# Patient Record
Sex: Female | Born: 1995 | Race: White | Hispanic: No | Marital: Single | Smoking: Current some day smoker
Health system: Southern US, Community
[De-identification: ages and names within clinical notes are randomized; demographics above are authoritative.]

## PROBLEM LIST (undated history)

## (undated) ENCOUNTER — Inpatient Hospital Stay: Payer: Self-pay

## (undated) DIAGNOSIS — F32A Depression, unspecified: Secondary | ICD-10-CM

## (undated) DIAGNOSIS — G43909 Migraine, unspecified, not intractable, without status migrainosus: Secondary | ICD-10-CM

## (undated) DIAGNOSIS — F329 Major depressive disorder, single episode, unspecified: Secondary | ICD-10-CM

## (undated) DIAGNOSIS — I499 Cardiac arrhythmia, unspecified: Secondary | ICD-10-CM

## (undated) DIAGNOSIS — Z8619 Personal history of other infectious and parasitic diseases: Secondary | ICD-10-CM

## (undated) DIAGNOSIS — S2220XA Unspecified fracture of sternum, initial encounter for closed fracture: Secondary | ICD-10-CM

## (undated) DIAGNOSIS — T148XXA Other injury of unspecified body region, initial encounter: Secondary | ICD-10-CM

## (undated) DIAGNOSIS — F419 Anxiety disorder, unspecified: Secondary | ICD-10-CM

## (undated) DIAGNOSIS — D563 Thalassemia minor: Secondary | ICD-10-CM

## (undated) HISTORY — DX: Migraine, unspecified, not intractable, without status migrainosus: G43.909

## (undated) HISTORY — DX: Major depressive disorder, single episode, unspecified: F32.9

## (undated) HISTORY — DX: Depression, unspecified: F32.A

## (undated) HISTORY — DX: Cardiac arrhythmia, unspecified: I49.9

## (undated) HISTORY — PX: WISDOM TOOTH EXTRACTION: SHX21

---
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
## 2015-01-06 ENCOUNTER — Observation Stay
Admission: EM | Admit: 2015-01-06 | Discharge: 2015-01-06 | Disposition: A | Discharge status: Home / Self Care | Payer: Medicaid Other | Attending: Obstetrics and Gynecology | Admitting: Obstetrics and Gynecology

## 2015-01-06 ENCOUNTER — Other Ambulatory Visit: Payer: Self-pay | Admitting: Family Medicine

## 2015-01-06 ENCOUNTER — Encounter: Payer: Self-pay | Admitting: *Deleted

## 2015-01-06 DIAGNOSIS — O26899 Other specified pregnancy related conditions, unspecified trimester: Secondary | ICD-10-CM | POA: Diagnosis not present

## 2015-01-06 DIAGNOSIS — Z3A00 Weeks of gestation of pregnancy not specified: Secondary | ICD-10-CM | POA: Diagnosis not present

## 2015-01-06 DIAGNOSIS — Z720 Tobacco use: Secondary | ICD-10-CM

## 2015-01-06 DIAGNOSIS — R1030 Lower abdominal pain, unspecified: Secondary | ICD-10-CM | POA: Diagnosis not present

## 2015-01-06 LAB — URINALYSIS COMPLETE WITH MICROSCOPIC (ARMC ONLY)
Bilirubin Urine: NEGATIVE
Glucose, UA: NEGATIVE mg/dL
Hgb urine dipstick: NEGATIVE
KETONES UR: NEGATIVE mg/dL
Leukocytes, UA: NEGATIVE
NITRITE: NEGATIVE
PH: 7 (ref 5.0–8.0)
Protein, ur: NEGATIVE mg/dL
Specific Gravity, Urine: 1.015 (ref 1.005–1.030)

## 2015-01-06 MED ORDER — ACETAMINOPHEN-CODEINE 300-30 MG PO TABS: 1.0000 | ORAL_TABLET | Freq: Four times a day (QID) | ORAL | Status: DC | PRN

## 2015-01-06 NOTE — OB Triage Note (Signed)
Complaints of intermittent lower abdominal pain with feelings of "my belly locking down". No leaking fluid, vaginal bleeding.

## 2015-01-06 NOTE — Telephone Encounter (Signed)
Patient notified RX was phoned in to the pharmacy.

## 2015-01-06 NOTE — Discharge Summary (Signed)
Reviewed discharge instruction with patient including signs to return: CTX, bleeding, decreased fetal movement, and pre-eclampsia signs

## 2015-01-06 NOTE — Discharge Instructions (Signed)
Smoking Cessation, Tips for Success If you are ready to quit smoking, congratulations! You have chosen to help yourself be healthier. Cigarettes bring nicotine, tar, carbon monoxide, and other irritants into your body. Your lungs, heart, and blood vessels will be able to work better without these poisons. There are many different ways to quit smoking. Nicotine gum, nicotine patches, a nicotine inhaler, or nicotine nasal spray can help with physical craving. Hypnosis, support groups, and medicines help break the habit of smoking. WHAT THINGS CAN I DO TO MAKE QUITTING EASIER?  Here are some tips to help you quit for good:  Pick a date when you will quit smoking completely. Tell all of your friends and family about your plan to quit on that date.  Do not try to slowly cut down on the number of cigarettes you are smoking. Pick a quit date and quit smoking completely starting on that day.  Throw away all cigarettes.   Clean and remove all ashtrays from your home, work, and car.  On a card, write down your reasons for quitting. Carry the card with you and read it when you get the urge to smoke.  Cleanse your body of nicotine. Drink enough water and fluids to keep your urine clear or pale yellow. Do this after quitting to flush the nicotine from your body.  Learn to predict your moods. Do not let a bad situation be your excuse to have a cigarette. Some situations in your life might tempt you into wanting a cigarette.  Never have "just one" cigarette. It leads to wanting another and another. Remind yourself of your decision to quit.  Change habits associated with smoking. If you smoked while driving or when feeling stressed, try other activities to replace smoking. Stand up when drinking your coffee. Brush your teeth after eating. Sit in a different chair when you read the paper. Avoid alcohol while trying to quit, and try to drink fewer caffeinated beverages. Alcohol and caffeine may urge you to  smoke.  Avoid foods and drinks that can trigger a desire to smoke, such as sugary or spicy foods and alcohol.  Ask people who smoke not to smoke around you.  Have something planned to do right after eating or having a cup of coffee. For example, plan to take a walk or exercise.  Try a relaxation exercise to calm you down and decrease your stress. Remember, you may be tense and nervous for the first 2 weeks after you quit, but this will pass.  Find new activities to keep your hands busy. Play with a pen, coin, or rubber band. Doodle or draw things on paper.  Brush your teeth right after eating. This will help cut down on the craving for the taste of tobacco after meals. You can also try mouthwash.   Use oral substitutes in place of cigarettes. Try using lemon drops, carrots, cinnamon sticks, or chewing gum. Keep them handy so they are available when you have the urge to smoke.  When you have the urge to smoke, try deep breathing.  Designate your home as a nonsmoking area.  If you are a heavy smoker, ask your health care provider about a prescription for nicotine chewing gum. It can ease your withdrawal from nicotine.  Reward yourself. Set aside the cigarette money you save and buy yourself something nice.  Look for support from others. Join a support group or smoking cessation program. Ask someone at home or at work to help you with your plan   to quit smoking.  Always ask yourself, "Do I need this cigarette or is this just a reflex?" Tell yourself, "Today, I choose not to smoke," or "I do not want to smoke." You are reminding yourself of your decision to quit.  Do not replace cigarette smoking with electronic cigarettes (commonly called e-cigarettes). The safety of e-cigarettes is unknown, and some may contain harmful chemicals.  If you relapse, do not give up! Plan ahead and think about what you will do the next time you get the urge to smoke. HOW WILL I FEEL WHEN I QUIT SMOKING? You  may have symptoms of withdrawal because your body is used to nicotine (the addictive substance in cigarettes). You may crave cigarettes, be irritable, feel very hungry, cough often, get headaches, or have difficulty concentrating. The withdrawal symptoms are only temporary. They are strongest when you first quit but will go away within 10-14 days. When withdrawal symptoms occur, stay in control. Think about your reasons for quitting. Remind yourself that these are signs that your body is healing and getting used to being without cigarettes. Remember that withdrawal symptoms are easier to treat than the major diseases that smoking can cause.  Even after the withdrawal is over, expect periodic urges to smoke. However, these cravings are generally short lived and will go away whether you smoke or not. Do not smoke! WHAT RESOURCES ARE AVAILABLE TO HELP ME QUIT SMOKING? Your health care provider can direct you to community resources or hospitals for support, which may include:  Group support.  Education.  Hypnosis.  Therapy.   This information is not intended to replace advice given to you by your health care provider. Make sure you discuss any questions you have with your health care provider.   Document Released: 11/27/2003 Document Revised: 03/21/2014 Document Reviewed: 08/16/2012 Elsevier Interactive Patient Education 2016 [employer]  

## 2015-01-14 ENCOUNTER — Telehealth: Payer: Self-pay

## 2015-01-14 DIAGNOSIS — Z720 Tobacco use: Secondary | ICD-10-CM

## 2015-01-14 NOTE — Telephone Encounter (Signed)
Pt calls stating her left ribs are hurting ie: sharp pain under left rib, and since last night pt states she started hurting when she voiding. Pt states [facility]/hands swollen, turning purple, and headache and they sent her home. Her B/P was okay per pt. No swelling of hands/feet today. Pt to take Tylenol or her Tylenol #3 (pt states she tries not to take it and has not), drink fluids, heating pad to back, not to side or stomach. If pain unbearable she can go to ER otherwise we will see at appt tomorrow.  Dr. Valentino Saxonherry aware.

## 2015-01-15 ENCOUNTER — Ambulatory Visit (INDEPENDENT_AMBULATORY_CARE_PROVIDER_SITE_OTHER): Payer: Medicaid Other | Admitting: Obstetrics and Gynecology

## 2015-01-15 VITALS — BP 121/82 | HR 101 | Wt 202.3 lb

## 2015-01-15 DIAGNOSIS — Z3493 Encounter for supervision of normal pregnancy, unspecified, third trimester: Secondary | ICD-10-CM

## 2015-01-15 DIAGNOSIS — R399 Unspecified symptoms and signs involving the genitourinary system: Secondary | ICD-10-CM

## 2015-01-15 LAB — POCT URINALYSIS DIPSTICK
Bilirubin, UA: NEGATIVE
Glucose, UA: NEGATIVE
KETONES UA: NEGATIVE
Leukocytes, UA: NEGATIVE
Nitrite, UA: NEGATIVE
PH UA: 7
PROTEIN UA: NEGATIVE
RBC UA: NEGATIVE
SPEC GRAV UA: 1.015
UROBILINOGEN UA: NEGATIVE

## 2015-01-15 NOTE — Progress Notes (Signed)
ROB: Patient doing well today. [facility] due to extreme swelling of hands and feet, hurt to ambulate. Only modest resolution with elevation of legs. Advised on compression stockings and hand brace prn.  BPs wnl, however has had 7 lb weight gain in 2 weeks.  Will continue to monitor.  Also notes urinary discomfort and urgency.  UA wnl today. Possibly due to fetal positioning.  RTC in 2 weeks.

## 2015-01-17 LAB — URINE CULTURE: ORGANISM ID, BACTERIA: NO GROWTH

## 2015-01-26 ENCOUNTER — Telehealth: Payer: Self-pay

## 2015-01-26 DIAGNOSIS — Z720 Tobacco use: Secondary | ICD-10-CM

## 2015-01-26 NOTE — Telephone Encounter (Signed)
Pt called telepone advice line on 01/24/2015 with throat is sore and feels swollen, nose is blocked on left side, ears are hurting, having contractions, stomach is getting tight, sharp pains, feels warm but does not have thermometer. Continues to have cough, productive with green and sometimes nose bleeds,  ?fever, throat sore since Friday. Pt to try normal saline nose spray, cool mist humidifier, increase fluid intake and rest. Encouraged to obtain a thermometer. Advised colds are a virus and usually need to run there course which is approx. About 1 wk. But sometimes and infection occurs before hand. Pt said she called hospital because she couldn't sleep Friday night. No c/o contractions. To contact office tomorrow if symptons worsen.

## 2015-01-29 ENCOUNTER — Ambulatory Visit (INDEPENDENT_AMBULATORY_CARE_PROVIDER_SITE_OTHER): Payer: Medicaid Other | Admitting: Obstetrics and Gynecology

## 2015-01-29 VITALS — BP 113/75 | HR 87 | Wt 209.5 lb

## 2015-01-29 DIAGNOSIS — Z3493 Encounter for supervision of normal pregnancy, unspecified, third trimester: Secondary | ICD-10-CM

## 2015-01-29 DIAGNOSIS — J069 Acute upper respiratory infection, unspecified: Secondary | ICD-10-CM

## 2015-01-29 DIAGNOSIS — Z113 Encounter for screening for infections with a predominantly sexual mode of transmission: Secondary | ICD-10-CM

## 2015-01-29 DIAGNOSIS — Z1389 Encounter for screening for other disorder: Secondary | ICD-10-CM

## 2015-01-29 LAB — POCT URINALYSIS DIPSTICK
Bilirubin, UA: NEGATIVE
Glucose, UA: NEGATIVE
KETONES UA: NEGATIVE
Leukocytes, UA: NEGATIVE
Nitrite, UA: 1
PH UA: 7
PROTEIN UA: NEGATIVE
RBC UA: NEGATIVE
SPEC GRAV UA: 1.02
UROBILINOGEN UA: NEGATIVE

## 2015-01-29 NOTE — Progress Notes (Signed)
Patient notes she is still having URI symptoms despite robitussin use.  Advised on Tylenol Cold product. 36 week labs done. Initial BP elevated but repeat wnl.  RTC in 2 weeks.

## 2015-01-31 ENCOUNTER — Observation Stay
Admission: EM | Admit: 2015-01-31 | Discharge: 2015-01-31 | Disposition: A | Discharge status: Home / Self Care | Payer: Medicaid Other | Attending: Obstetrics and Gynecology | Admitting: Obstetrics and Gynecology

## 2015-01-31 DIAGNOSIS — Z3A36 36 weeks gestation of pregnancy: Secondary | ICD-10-CM | POA: Insufficient documentation

## 2015-01-31 DIAGNOSIS — O479 False labor, unspecified: Secondary | ICD-10-CM | POA: Diagnosis present

## 2015-01-31 DIAGNOSIS — O4700 False labor before 37 completed weeks of gestation, unspecified trimester: Secondary | ICD-10-CM | POA: Diagnosis present

## 2015-01-31 LAB — GC/CHLAMYDIA PROBE AMP
CHLAMYDIA, DNA PROBE: NEGATIVE
NEISSERIA GONORRHOEAE BY PCR: NEGATIVE

## 2015-01-31 NOTE — Plan of Care (Signed)
cerivx unchanged after being observed in l/d x 2 hours. Signs and symptoms of labor reviewed. Pt d/c home in stable condition.

## 2015-02-02 LAB — CULTURE, BETA STREP (GROUP B ONLY): STREP GP B CULTURE: NEGATIVE

## 2015-02-03 ENCOUNTER — Ambulatory Visit (INDEPENDENT_AMBULATORY_CARE_PROVIDER_SITE_OTHER): Payer: Medicaid Other | Admitting: Obstetrics and Gynecology

## 2015-02-03 VITALS — BP 118/75 | HR 103 | Wt 209.7 lb

## 2015-02-03 DIAGNOSIS — Z3493 Encounter for supervision of normal pregnancy, unspecified, third trimester: Secondary | ICD-10-CM

## 2015-02-03 DIAGNOSIS — O2603 Excessive weight gain in pregnancy, third trimester: Secondary | ICD-10-CM

## 2015-02-03 LAB — POCT URINALYSIS DIPSTICK
Bilirubin, UA: NEGATIVE
Glucose, UA: NEGATIVE
Ketones, UA: NEGATIVE
Leukocytes, UA: NEGATIVE
NITRITE UA: NEGATIVE
RBC UA: NEGATIVE
UROBILINOGEN UA: NEGATIVE
pH, UA: 6

## 2015-02-03 NOTE — Progress Notes (Signed)
ROB: Patient complains of intermmitent ctx.  Seen in triage over the weekend for ctx, ruled out for labor. PTL precautions given. 3rd trimester labs negative. Counseled on excessive weight gain.

## 2015-02-10 ENCOUNTER — Ambulatory Visit (INDEPENDENT_AMBULATORY_CARE_PROVIDER_SITE_OTHER): Payer: Medicaid Other | Admitting: Obstetrics and Gynecology

## 2015-02-10 VITALS — BP 121/77 | HR 99 | Wt 213.0 lb

## 2015-02-10 DIAGNOSIS — Z3483 Encounter for supervision of other normal pregnancy, third trimester: Secondary | ICD-10-CM

## 2015-02-10 DIAGNOSIS — F419 Anxiety disorder, unspecified: Secondary | ICD-10-CM

## 2015-02-10 DIAGNOSIS — Z3493 Encounter for supervision of normal pregnancy, unspecified, third trimester: Secondary | ICD-10-CM

## 2015-02-10 DIAGNOSIS — O2600 Excessive weight gain in pregnancy, unspecified trimester: Secondary | ICD-10-CM

## 2015-02-10 LAB — POCT URINALYSIS DIPSTICK
Bilirubin, UA: NEGATIVE
Blood, UA: NEGATIVE
GLUCOSE UA: NEGATIVE
Ketones, UA: NEGATIVE
Leukocytes, UA: NEGATIVE
Nitrite, UA: NEGATIVE
Protein, UA: NEGATIVE
SPEC GRAV UA: 1.015
UROBILINOGEN UA: NEGATIVE
pH, UA: 6.5

## 2015-02-10 MED ORDER — ACETAMINOPHEN-CODEINE 300-30 MG PO TABS: 1.0000 | ORAL_TABLET | Freq: Four times a day (QID) | ORAL | Status: DC | PRN

## 2015-02-10 NOTE — Progress Notes (Signed)
ROB: Patient notes that she lost mucus plug.  Is having occasional contractions.  Labor precautions given.  Refill given of T#3 at patient's request. Continue to encourage no further weight gain.

## 2015-02-11 ENCOUNTER — Inpatient Hospital Stay
Admission: EM | Admit: 2015-02-11 | Discharge: 2015-02-14 | DRG: 774 | Disposition: A | Discharge status: Home / Self Care | Payer: Medicaid Other | Attending: Obstetrics and Gynecology | Admitting: Obstetrics and Gynecology

## 2015-02-11 ENCOUNTER — Inpatient Hospital Stay: Payer: Medicaid Other | Admitting: Anesthesiology

## 2015-02-11 ENCOUNTER — Observation Stay
Admission: EM | Admit: 2015-02-11 | Discharge: 2015-02-11 | Disposition: A | Discharge status: Home / Self Care | Payer: Medicaid Other | Source: Home / Self Care | Admitting: Obstetrics and Gynecology

## 2015-02-11 DIAGNOSIS — F17210 Nicotine dependence, cigarettes, uncomplicated: Secondary | ICD-10-CM | POA: Diagnosis present

## 2015-02-11 DIAGNOSIS — O2600 Excessive weight gain in pregnancy, unspecified trimester: Secondary | ICD-10-CM | POA: Diagnosis present

## 2015-02-11 DIAGNOSIS — Z3A37 37 weeks gestation of pregnancy: Secondary | ICD-10-CM | POA: Insufficient documentation

## 2015-02-11 DIAGNOSIS — I471 Supraventricular tachycardia: Secondary | ICD-10-CM | POA: Diagnosis not present

## 2015-02-11 DIAGNOSIS — O9942 Diseases of the circulatory system complicating childbirth: Secondary | ICD-10-CM | POA: Diagnosis not present

## 2015-02-11 DIAGNOSIS — O134 Gestational [pregnancy-induced] hypertension without significant proteinuria, complicating childbirth: Principal | ICD-10-CM | POA: Diagnosis present

## 2015-02-11 DIAGNOSIS — Z720 Tobacco use: Secondary | ICD-10-CM

## 2015-02-11 DIAGNOSIS — O26899 Other specified pregnancy related conditions, unspecified trimester: Secondary | ICD-10-CM | POA: Diagnosis present

## 2015-02-11 DIAGNOSIS — Z3403 Encounter for supervision of normal first pregnancy, third trimester: Secondary | ICD-10-CM | POA: Diagnosis not present

## 2015-02-11 DIAGNOSIS — Z8249 Family history of ischemic heart disease and other diseases of the circulatory system: Secondary | ICD-10-CM | POA: Diagnosis not present

## 2015-02-11 DIAGNOSIS — O99334 Smoking (tobacco) complicating childbirth: Secondary | ICD-10-CM | POA: Diagnosis present

## 2015-02-11 DIAGNOSIS — O479 False labor, unspecified: Secondary | ICD-10-CM | POA: Diagnosis present

## 2015-02-11 DIAGNOSIS — Z6791 Unspecified blood type, Rh negative: Secondary | ICD-10-CM | POA: Diagnosis present

## 2015-02-11 LAB — CBC
HCT: 41.6 % (ref 35.0–47.0)
HEMOGLOBIN: 13.6 g/dL (ref 12.0–16.0)
MCH: 27.4 pg (ref 26.0–34.0)
MCHC: 32.6 g/dL (ref 32.0–36.0)
MCV: 84 fL (ref 80.0–100.0)
Platelets: 220 10*3/uL (ref 150–440)
RBC: 4.95 MIL/uL (ref 3.80–5.20)
RDW: 14.1 % (ref 11.5–14.5)
WBC: 14.4 10*3/uL — ABNORMAL HIGH (ref 3.6–11.0)

## 2015-02-11 LAB — COMPREHENSIVE METABOLIC PANEL
ALBUMIN: 3.2 g/dL — AB (ref 3.5–5.0)
ALK PHOS: 134 U/L — AB (ref 38–126)
ALT: 16 U/L (ref 14–54)
ANION GAP: 7 (ref 5–15)
AST: 39 U/L (ref 15–41)
BUN: 9 mg/dL (ref 6–20)
CALCIUM: 9.1 mg/dL (ref 8.9–10.3)
CO2: 24 mmol/L (ref 22–32)
Chloride: 106 mmol/L (ref 101–111)
Creatinine, Ser: 0.61 mg/dL (ref 0.44–1.00)
GFR calc Af Amer: >60 mL/min (ref >60)
GFR calc non Af Amer: >60 mL/min (ref >60)
GLUCOSE: 80 mg/dL (ref 65–99)
POTASSIUM: 3.9 mmol/L (ref 3.5–5.1)
SODIUM: 137 mmol/L (ref 135–145)
Total Bilirubin: <0.1 mg/dL — ABNORMAL LOW (ref 0.3–1.2)
Total Protein: 6.9 g/dL (ref 6.5–8.1)

## 2015-02-11 LAB — URIC ACID: Uric Acid, Serum: 5.6 mg/dL (ref 2.3–6.6)

## 2015-02-11 LAB — RAPID HIV SCREEN (HIV 1/2 AB+AG)
HIV 1/2 Antibodies: NONREACTIVE
HIV-1 P24 ANTIGEN - HIV24: NONREACTIVE

## 2015-02-11 MED ORDER — BUTORPHANOL TARTRATE 1 MG/ML IJ SOLN
1.0000 mg | INTRAMUSCULAR | Status: DC | PRN
  Administered 2015-02-11 – 2015-02-12 (×2): 1 mg via INTRAVENOUS
  Filled 2015-02-11 (×2): qty 1

## 2015-02-11 MED ORDER — ONDANSETRON HCL 4 MG/2ML IJ SOLN: 4.0000 mg | Freq: Four times a day (QID) | INTRAMUSCULAR | Status: DC | PRN

## 2015-02-11 MED ORDER — LIDOCAINE HCL (PF) 1 % IJ SOLN
INTRAMUSCULAR | Status: DC | PRN
  Administered 2015-02-11 – 2015-02-12 (×2): 1 mL via INTRADERMAL

## 2015-02-11 MED ORDER — ACETAMINOPHEN 325 MG PO TABS: 650.0000 mg | ORAL_TABLET | ORAL | Status: DC | PRN

## 2015-02-11 MED ORDER — LACTATED RINGERS IV SOLN
INTRAVENOUS | Status: DC
  Administered 2015-02-11: 1000 mL via INTRAVENOUS

## 2015-02-11 MED ORDER — LIDOCAINE HCL (PF) 1 % IJ SOLN
30.0000 mL | INTRAMUSCULAR | Status: DC | PRN
  Filled 2015-02-11: qty 30

## 2015-02-11 MED ORDER — LACTATED RINGERS IV SOLN: 500.0000 mL | INTRAVENOUS | Status: DC | PRN

## 2015-02-11 MED ORDER — BUPIVACAINE HCL (PF) 0.25 % IJ SOLN
INTRAMUSCULAR | Status: DC | PRN
  Administered 2015-02-11 – 2015-02-12 (×2): 5 mL via EPIDURAL

## 2015-02-11 MED ORDER — OXYTOCIN BOLUS FROM INFUSION
500.0000 mL | INTRAVENOUS | Status: DC
  Administered 2015-02-12: 500 mL via INTRAVENOUS

## 2015-02-11 MED ORDER — FENTANYL 2.5 MCG/ML W/ROPIVACAINE 0.2% IN NS 100 ML EPIDURAL INFUSION (ARMC-ANES)
EPIDURAL | Status: AC
  Administered 2015-02-11: 10 mL/h via EPIDURAL
  Filled 2015-02-11: qty 100

## 2015-02-11 MED ORDER — OXYTOCIN 40 UNITS IN LACTATED RINGERS INFUSION - SIMPLE MED
62.5000 mL/h | INTRAVENOUS | Status: DC
  Filled 2015-02-11: qty 1000

## 2015-02-11 MED ORDER — LIDOCAINE-EPINEPHRINE (PF) 1.5 %-1:200000 IJ SOLN
INTRAMUSCULAR | Status: DC | PRN
  Administered 2015-02-11 – 2015-02-12 (×2): 3 mL via EPIDURAL

## 2015-02-11 MED ORDER — HYDROXYZINE HCL 50 MG PO TABS
50.0000 mg | ORAL_TABLET | Freq: Four times a day (QID) | ORAL | Status: DC | PRN
  Filled 2015-02-11: qty 1

## 2015-02-11 MED ORDER — CITRIC ACID-SODIUM CITRATE 334-500 MG/5ML PO SOLN: 30.0000 mL | ORAL | Status: DC | PRN

## 2015-02-11 NOTE — Progress Notes (Signed)
Discharge instructions reviewed with patient, pt. Signed copies. Pt. Verbalized understanding of all discharge instructions, copy given.

## 2015-02-11 NOTE — Anesthesia Procedure Notes (Addendum)
Epidural Patient location during procedure: OB Start time: 02/11/2015 11:21 PM End time: 02/11/2015 11:23 PM  Staffing Anesthesiologist: Margorie JohnPISCITELLO, JOSEPH K Performed by: anesthesiologist   Preanesthetic Checklist Completed: patient identified, site marked, surgical consent, pre-op evaluation, timeout performed, IV checked, risks and benefits discussed and monitors and equipment checked  Epidural Patient position: sitting Prep: Betadine Patient monitoring: heart rate, continuous pulse ox and blood pressure Approach: midline Location: L4-L5 Injection technique: LOR saline  Needle:  Needle type: Tuohy  Needle gauge: 17 G Needle length: 9 cm and 9 Needle insertion depth: 5 cm Catheter type: closed end flexible Catheter size: 19 Gauge Catheter at skin depth: 9 cm Test dose: negative and 1.5% lidocaine with Epi 1:200 K  Assessment Sensory level: T8 Events: blood not aspirated, injection not painful, no injection resistance, negative IV test and no paresthesia  Additional Notes   Patient tolerated the insertion well without immediate complications.Reason for block:procedure for pain  Epidural Patient location during procedure: OB Start time: 02/12/2015 12:30 AM End time: 02/12/2015 12:32 AM  Staffing Anesthesiologist: Margorie JohnPISCITELLO, JOSEPH K Performed by: anesthesiologist   Preanesthetic Checklist Completed: patient identified, site marked, surgical consent, pre-op evaluation, timeout performed, IV checked, risks and benefits discussed and monitors and equipment checked  Epidural Patient position: sitting Prep: Betadine Patient monitoring: heart rate, continuous pulse ox and blood pressure Approach: midline Location: L3-L4 Injection technique: LOR saline  Needle:  Needle type: Tuohy  Needle gauge: 17 G Needle length: 9 cm and 9 Needle insertion depth: 9 cm Catheter type: closed end flexible Catheter size: 19 Gauge Catheter at skin depth: 13 cm Test dose:  negative and 1.5% lidocaine with Epi 1:200 K  Assessment Sensory level: T8 Events: blood not aspirated, injection not painful, no injection resistance, negative IV test and no paresthesia  Additional Notes   Patient tolerated the insertion well without immediate complications.Reason for block:procedure for pain

## 2015-02-11 NOTE — Final Progress Note (Addendum)
L&D OB Triage Note  HPI:  Sandra Rivera is a 19 y.o. G1P0 female at 1580w6d, Estimated Date of Delivery: 02/26/15 who presents for complaints of worsening back pain since 3:00 a.m. with abdominal tightness. Also notes bright red vaginal bleeding when wiping.  Denies LOF and notes good FM.   I have reviewed the patient's medical, social, family, surgical, medication, and allergy history in detail and updated the computerized patient record.  ROS:  Review of Systems - Negative except bleeding when wiping (bright red blood), mucoid discharge, back pain  Physical Exam:  Blood pressure 125/87, pulse 94, temperature 98.6 F (37 C), temperature source Oral, resp. rate 18, last menstrual period 05/22/2014. General appearance: alert and no distress Abdomen: soft, non-tender; bowel sounds normal; no masses,  no organomegaly Pelvic: external genitalia normal and cervix 3.5/80/-1/cephalic (unchanged after 2.5 hrs) Extremities: extremities normal, atraumatic, no cyanosis or edema   NST INTERPRETATION: Indications: rule out uterine contractions  Mode: External Baseline Rate (A): 125 bpm Variability: Moderate Accelerations: 15 x 15 Decelerations: Variable     Contraction Frequency (min): 1-6  Impression: reactive   Labs:  Urinalysis Results for orders placed or performed in visit on 02/10/15  POCT urinalysis dipstick  Result Value Ref Range   Color, UA Dark Yellow    Clarity, UA clear    Glucose, UA neg    Bilirubin, UA neg    Ketones, UA neg    Spec Grav, UA 1.015    Blood, UA neg    pH, UA 6.5    Protein, UA neg    Urobilinogen, UA negative    Nitrite, UA neg    Leukocytes, UA Negative Negative     Plan: NST performed was reviewed and was found to be reactive. She was discharged home with bleeding/labor precautions.  Continue routine prenatal care. Follow up with OB/GYN as previously scheduled.     Hildred LaserAnika Alvilda Mckenna, MD

## 2015-02-11 NOTE — OB Triage Note (Signed)
Pt presents to L&D with c/o back pain getting worse since 3 am with some abdominal tightness. Pt also reports bright red vaginal bleeding when she wipes. Reports fetal movement and no gushes of fluid
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
## 2015-02-11 NOTE — Progress Notes (Signed)
Intrapartum Progress Note  S: Patient notes pain with contractions, moderate, mostly in back.  O: Blood pressure 141/88, pulse 106, temperature 98 F (36.7 C), temperature source Oral, resp. rate 20, last menstrual period 05/22/2014. Gen App: NAD, mild distress Abdomen: soft, gravid FHT: baseline 125 bpm.  Accels present.  Decels absent. moderate in degree variability.   Tocometer: contractions q 3-5 minutes Cervix: 6-7/100/0 Extremities: Nontender, no edema.   Assessment:  1: SIUP at 845w6d 2. Active labor 3. Elevated BPs  Plan:  1. AROM with clear fluid 2. Anticipate vaginal delivery 3. Pending PIH lab evaluation  Hildred LaserAnika Siddharth Babington, MD 02/11/2015 9:35 PM
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
## 2015-02-11 NOTE — OB Triage Note (Signed)
Pt oriented to LDR 2 for contractions 2-615mins apart. Placed on monitors and will update MD.

## 2015-02-12 DIAGNOSIS — Z3403 Encounter for supervision of normal first pregnancy, third trimester: Secondary | ICD-10-CM

## 2015-02-12 LAB — CBC
HEMATOCRIT: 35.9 % (ref 35.0–47.0)
Hemoglobin: 11.6 g/dL — ABNORMAL LOW (ref 12.0–16.0)
MCH: 27.4 pg (ref 26.0–34.0)
MCHC: 32.5 g/dL (ref 32.0–36.0)
MCV: 84.4 fL (ref 80.0–100.0)
PLATELETS: 196 10*3/uL (ref 150–440)
RBC: 4.25 MIL/uL (ref 3.80–5.20)
RDW: 14.3 % (ref 11.5–14.5)
WBC: 18.8 10*3/uL — ABNORMAL HIGH (ref 3.6–11.0)

## 2015-02-12 LAB — TYPE AND SCREEN
ABO/RH(D): B NEG
Antibody Screen: POSITIVE

## 2015-02-12 LAB — URINALYSIS COMPLETE WITH MICROSCOPIC (ARMC ONLY)
BILIRUBIN URINE: NEGATIVE
Glucose, UA: NEGATIVE mg/dL
Hgb urine dipstick: NEGATIVE
KETONES UR: NEGATIVE mg/dL
Leukocytes, UA: NEGATIVE
NITRITE: NEGATIVE
Protein, ur: 30 mg/dL — AB
Specific Gravity, Urine: 1.025 (ref 1.005–1.030)
pH: 6 (ref 5.0–8.0)

## 2015-02-12 LAB — ABO/RH: ABO/RH(D): B NEG

## 2015-02-12 LAB — PROTEIN / CREATININE RATIO, URINE
Creatinine, Urine: 146 mg/dL
Protein Creatinine Ratio: 0.18 mg/mg{Cre} — ABNORMAL HIGH (ref 0.00–0.15)
TOTAL PROTEIN, URINE: 27 mg/dL

## 2015-02-12 MED ORDER — NALBUPHINE HCL 10 MG/ML IJ SOLN
5.0000 mg | INTRAMUSCULAR | Status: DC | PRN
  Filled 2015-02-12: qty 0.5

## 2015-02-12 MED ORDER — IBUPROFEN 600 MG PO TABS
ORAL_TABLET | ORAL | Status: AC
Start: 2015-02-12 — End: 2015-02-12
  Administered 2015-02-12: 600 mg via ORAL
  Filled 2015-02-12: qty 1

## 2015-02-12 MED ORDER — DIBUCAINE 1 % RE OINT: 1.0000 "application " | TOPICAL_OINTMENT | RECTAL | Status: DC | PRN

## 2015-02-12 MED ORDER — NALBUPHINE HCL 10 MG/ML IJ SOLN
5.0000 mg | Freq: Once | INTRAMUSCULAR | Status: DC | PRN
  Filled 2015-02-12: qty 0.5

## 2015-02-12 MED ORDER — BENZOCAINE-MENTHOL 20-0.5 % EX AERO: 1.0000 "application " | INHALATION_SPRAY | CUTANEOUS | Status: DC | PRN

## 2015-02-12 MED ORDER — SENNOSIDES-DOCUSATE SODIUM 8.6-50 MG PO TABS
2.0000 | ORAL_TABLET | ORAL | Status: DC
  Administered 2015-02-12 – 2015-02-13 (×2): 2 via ORAL
  Filled 2015-02-12 (×2): qty 2

## 2015-02-12 MED ORDER — NALOXONE HCL 2 MG/2ML IJ SOSY: 1.0000 ug/kg/h | PREFILLED_SYRINGE | INTRAVENOUS | Status: DC | PRN

## 2015-02-12 MED ORDER — SIMETHICONE 80 MG PO CHEW: 80.0000 mg | CHEWABLE_TABLET | ORAL | Status: DC | PRN

## 2015-02-12 MED ORDER — DIPHENHYDRAMINE HCL 25 MG PO CAPS: 25.0000 mg | ORAL_CAPSULE | ORAL | Status: DC | PRN

## 2015-02-12 MED ORDER — OXYCODONE-ACETAMINOPHEN 5-325 MG PO TABS
1.0000 | ORAL_TABLET | ORAL | Status: DC | PRN
Start: 2015-02-12 — End: 2015-02-14
  Administered 2015-02-12 – 2015-02-14 (×7): 1 via ORAL
  Filled 2015-02-12 (×6): qty 1

## 2015-02-12 MED ORDER — ACETAMINOPHEN 325 MG PO TABS
650.0000 mg | ORAL_TABLET | ORAL | Status: DC | PRN
  Administered 2015-02-13 (×2): 650 mg via ORAL
  Filled 2015-02-12 (×3): qty 2

## 2015-02-12 MED ORDER — OXYCODONE-ACETAMINOPHEN 5-325 MG PO TABS
2.0000 | ORAL_TABLET | ORAL | Status: DC | PRN
  Administered 2015-02-12 – 2015-02-13 (×2): 2 via ORAL
  Filled 2015-02-12 (×3): qty 2

## 2015-02-12 MED ORDER — DILTIAZEM HCL 25 MG/5ML IV SOLN
10.0000 mg | INTRAVENOUS | Status: AC
  Administered 2015-02-12: 25 mg via INTRAVENOUS
  Filled 2015-02-12: qty 5

## 2015-02-12 MED ORDER — PRENATAL MULTIVITAMIN CH
1.0000 | ORAL_TABLET | Freq: Every day | ORAL | Status: DC
  Administered 2015-02-12 – 2015-02-14 (×3): 1 via ORAL
  Filled 2015-02-12 (×3): qty 1

## 2015-02-12 MED ORDER — ZOLPIDEM TARTRATE 5 MG PO TABS: 5.0000 mg | ORAL_TABLET | Freq: Every evening | ORAL | Status: DC | PRN

## 2015-02-12 MED ORDER — LABETALOL HCL 5 MG/ML IV SOLN
INTRAVENOUS | Status: AC
  Administered 2015-02-12: 10 mg via INTRAVENOUS
  Filled 2015-02-12: qty 4

## 2015-02-12 MED ORDER — ONDANSETRON HCL 4 MG PO TABS: 4.0000 mg | ORAL_TABLET | ORAL | Status: DC | PRN

## 2015-02-12 MED ORDER — IBUPROFEN 600 MG PO TABS
600.0000 mg | ORAL_TABLET | Freq: Four times a day (QID) | ORAL | Status: DC
  Administered 2015-02-12 – 2015-02-14 (×9): 600 mg via ORAL
  Filled 2015-02-12 (×9): qty 1

## 2015-02-12 MED ORDER — FENTANYL 2.5 MCG/ML W/ROPIVACAINE 0.2% IN NS 100 ML EPIDURAL INFUSION (ARMC-ANES)
10.0000 mL/h | EPIDURAL | Status: DC
  Administered 2015-02-11: 10 mL/h via EPIDURAL

## 2015-02-12 MED ORDER — LANOLIN HYDROUS EX OINT: TOPICAL_OINTMENT | CUTANEOUS | Status: DC | PRN

## 2015-02-12 MED ORDER — ONDANSETRON HCL 4 MG/2ML IJ SOLN: 4.0000 mg | INTRAMUSCULAR | Status: DC | PRN

## 2015-02-12 MED ORDER — SODIUM CHLORIDE 0.9 % IJ SOLN: 3.0000 mL | INTRAMUSCULAR | Status: DC | PRN

## 2015-02-12 MED ORDER — LABETALOL HCL 5 MG/ML IV SOLN
10.0000 mg | INTRAVENOUS | Status: DC | PRN
  Administered 2015-02-12: 10 mg via INTRAVENOUS

## 2015-02-12 MED ORDER — FERROUS SULFATE 325 (65 FE) MG PO TABS
325.0000 mg | ORAL_TABLET | Freq: Two times a day (BID) | ORAL | Status: DC
  Administered 2015-02-12 – 2015-02-14 (×4): 325 mg via ORAL
  Filled 2015-02-12 (×4): qty 1

## 2015-02-12 MED ORDER — LABETALOL HCL 5 MG/ML IV SOLN
10.0000 mg | INTRAVENOUS | Status: DC | PRN
Start: 2015-02-12 — End: 2015-02-14

## 2015-02-12 MED ORDER — ONDANSETRON HCL 4 MG/2ML IJ SOLN: 4.0000 mg | Freq: Three times a day (TID) | INTRAMUSCULAR | Status: DC | PRN

## 2015-02-12 MED ORDER — DIPHENHYDRAMINE HCL 50 MG/ML IJ SOLN: 12.5000 mg | INTRAMUSCULAR | Status: DC | PRN

## 2015-02-12 MED ORDER — NALOXONE HCL 0.4 MG/ML IJ SOLN: 0.4000 mg | INTRAMUSCULAR | Status: DC | PRN

## 2015-02-12 MED ORDER — DIPHENHYDRAMINE HCL 25 MG PO CAPS: 25.0000 mg | ORAL_CAPSULE | Freq: Four times a day (QID) | ORAL | Status: DC | PRN

## 2015-02-12 MED ORDER — WITCH HAZEL-GLYCERIN EX PADS
1.0000 "application " | MEDICATED_PAD | CUTANEOUS | Status: DC | PRN
  Administered 2015-02-12: 1 via TOPICAL
  Filled 2015-02-12: qty 100
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
## 2015-02-12 NOTE — Progress Notes (Signed)
Post Partum Day #0 s/p SVD  Subjective: no complaints, up ad lib, voiding and tolerating PO  Objective: Filed Vitals:   02/12/15 1158 02/12/15 1213 02/12/15 1228 02/12/15 1307  BP: 79/54 116/77 123/79 125/74  Pulse: 118 115 113 113  Temp:    98.1 F (36.7 C)  TempSrc:    Axillary  Resp:    18  Height:      Weight:      SpO2:    97%    Physical Exam:  General: alert and no distress Lochia: appropriate Uterine Fundus: firm Incision: None DVT Evaluation: No evidence of DVT seen on physical exam. Negative Homan's sign. No cords or calf tenderness. No significant calf/ankle edema.   Recent Labs  02/11/15 2108  HGB 13.6  HCT 41.6    Assessment/Plan: Continue to monitor vitals as patient with recent episode of sinus tachycardia vs SVT.  Currently pulse in 110s (improved from 170s earlier postpartum).  Is s/p consultation with cardiology who will continue to follow.  Continue routine postpartum care.     LOS: 1 day   Sandra Rivera 02/12/2015, 1:57 PM

## 2015-02-12 NOTE — Progress Notes (Addendum)
Intrapartum Progress Note  S: Patient c/o back pain and chest pain.  Denies SOB.   O:  Blood pressure 156/99, pulse 155, temperature 98.7 F (37.1 C), temperature source Oral, resp. rate 20, height 5\' 5"  (1.651 m), weight 213 lb (96.616 kg), last menstrual period 05/22/2014, SpO2 98 %. Gen App: NAD, mild distress Abdomen: soft, gravid FHT: baseline 115 bpm.  Accels present.  Decels absent. moderate in degree variability.   Tocometer: contractions q 2 minutes Cervix: anterior lip/100/+1 Extremities: Nontender, no edema.  Labs:  Results for orders placed or performed during the hospital encounter of 02/11/15  Uric acid  Result Value Ref Range   Uric Acid, Serum 5.6 2.3 - 6.6 mg/dL  Comprehensive metabolic panel  Result Value Ref Range   Sodium 137 135 - 145 mmol/L   Potassium 3.9 3.5 - 5.1 mmol/L   Chloride 106 101 - 111 mmol/L   CO2 24 22 - 32 mmol/L   Glucose, Bld 80 65 - 99 mg/dL   BUN 9 6 - 20 mg/dL   Creatinine, Ser 0.340.61 0.44 - 1.00 mg/dL   Calcium 9.1 8.9 - 74.210.3 mg/dL   Total Protein 6.9 6.5 - 8.1 g/dL   Albumin 3.2 (L) 3.5 - 5.0 g/dL   AST 39 15 - 41 U/L   ALT 16 14 - 54 U/L   Alkaline Phosphatase 134 (H) 38 - 126 U/L   Total Bilirubin <0.1 (L) 0.3 - 1.2 mg/dL   GFR calc non Af Amer >60 >60 mL/min   GFR calc Af Amer >60 >60 mL/min   Anion gap 7 5 - 15  CBC  Result Value Ref Range   WBC 14.4 (H) 3.6 - 11.0 K/uL   RBC 4.95 3.80 - 5.20 MIL/uL   Hemoglobin 13.6 12.0 - 16.0 g/dL   HCT 59.541.6 63.835.0 - 75.647.0 %   MCV 84.0 80.0 - 100.0 fL   MCH 27.4 26.0 - 34.0 pg   MCHC 32.6 32.0 - 36.0 g/dL   RDW 43.314.1 29.511.5 - 18.814.5 %   Platelets 220 150 - 440 K/uL  Rapid HIV screen (HIV 1/2 Ab+Ag) ([facility] Only)  Result Value Ref Range   HIV-1 P24 Antigen - HIV24 NON REACTIVE NON REACTIVE   HIV 1/2 Antibodies NON REACTIVE NON REACTIVE   Interpretation (HIV Ag Ab)      A non reactive test result means that HIV 1 or HIV 2 antibodies and HIV 1 p24 antigen were not detected in the specimen.   Type and screen [facility]  Result Value Ref Range   ABO/RH(D) B NEG    Antibody Screen POS    Sample Expiration 02/14/2015    Antibody Identification PASSIVELY ACQUIRED ANTI-D      Assessment:  1: SIUP at 6164w6d 2. Active labor 3. Elevated BPs 4. Tachycardia  Plan:  1. Anticipate imminent vaginal delivery 2. Pending PIH lab evaluation (urine PC ratio), all other labs wnl thus far.  Likekly with gestational HTN 3. Tachycardia with elevated BPs - patient recently received dose of Stadol.  Has also received an epidural. Possible cause of tachycardia.  No evidence of infection (i.e. Chorioamnionitis).  Will give dose of Labetalol 10 mg IV. Can repeat in 10 min if needed.   Hildred LaserAnika Harish Bram, MD 02/12/2015 3:21 AM

## 2015-02-13 LAB — RPR: RPR: NONREACTIVE

## 2015-02-13 NOTE — Progress Notes (Signed)
Post Partum Day #1 s/p SVD  Subjective: no complaints, up ad lib, voiding and tolerating PO.  Notes episode of chills overnight and racing heart, followed by an episode of night sweats.   Objective: Filed Vitals:   02/13/15 0310 02/13/15 0345 02/13/15 0458 02/13/15 0804  BP: 106/53  108/68 119/77  Pulse: 132 110 115 95  Temp: 99.8 F (37.7 C) 98 F (36.7 C) 98.2 F (36.8 C) 97.3 F (36.3 C)  TempSrc: Oral Oral Oral Oral  Resp: 20 20 20 18   Height:      Weight:      SpO2:        Physical Exam:  General: alert and no distress Lochia: appropriate Uterine Fundus: firm Incision: None DVT Evaluation: No evidence of DVT seen on physical exam. Negative Homan's sign. No cords or calf tenderness. No significant calf/ankle edema.   Recent Labs  02/11/15 2108 02/12/15 1358  HGB 13.6 11.6*  HCT 41.6 35.9    Assessment/Plan: Continue to monitor vitals as patient with episode of sinus tachycardia vs SVT yesterday.  Currently normal rate (with pulse max 130s overnight).  Is s/p consultation with cardiology who will continue to follow.  Continue routine postpartum care.  Desires circumcision for female infant. To be performed outpatient. Plans to breastfeed. Dispo: possible d/c home tomorrow, pending Cardiology recs.    LOS: 2 days   Hildred Lasernika Tomer Chalmers 02/13/2015, 7:56 AM

## 2015-02-13 NOTE — Addendum Note (Signed)
Addendum  created 02/13/15 1017 by Henrietta HooverKimberly Manjit Bufano, CRNA   Modules edited: Anesthesia Events

## 2015-02-13 NOTE — Lactation Note (Signed)
This note was copied from the chart of Sandra Rivera Mcgloin. Lactation Consultation Note  Patient Name: Sandra Rivera Behunin [id number]'s Date: 02/13/2015 Reason for consult: Follow-up assessment   Maternal Data Does the patient have breastfeeding experience prior to this delivery?: No Unable to observe a feeding as pt just fed baby, mostly nursing on left breast as she states it is larger, encouraged to offer right breast each feeding and that the size of the breast does not mean it doesn't have milk, explained that the frequency and quality of breastfeeding produces milk, encouraged her to call for assistance when latching to right breast, states she is able to latch baby to left breast by herself   Feeding Feeding Type: Breast Fed Length of feed: 24 min  LATCH Score/Interventions                      Lactation Tools Discussed/Used WIC Program: Yes   Consult Status      Dyann KiefMarsha D Marianela Mandrell 02/13/2015, 5:18 PM

## 2015-02-13 NOTE — Anesthesia Postprocedure Evaluation (Signed)
Anesthesia Post Note  Patient: Benancio Deedsamara M Devery  Procedure(s) Performed: * No procedures listed *  Patient location during evaluation: Mother Baby Anesthesia Type: Epidural Level of consciousness: awake Pain management: pain level controlled Vital Signs Assessment: post-procedure vital signs reviewed and stable Respiratory status: spontaneous breathing Postop Assessment: no headache, no backache and no signs of nausea or vomiting Anesthetic complications: yes Comments: Patient stated first epidural did not work.  It was repeated after 1 hour.  Her pelvis and right leg only were numb, which did help with pushing.  Block completely resolved.    Last Vitals:  Filed Vitals:   02/13/15 0345 02/13/15 0458  BP:  108/68  Pulse: 110 115  Temp: 36.7 C 36.8 C  Resp: 20 20    Last Pain:  Filed Vitals:   02/13/15 [id number]  PainSc: 4                  Vernie MurdersPope,  Jabre Heo G

## 2015-02-14 MED ORDER — IBUPROFEN 800 MG PO TABS: 800.0000 mg | ORAL_TABLET | Freq: Three times a day (TID) | ORAL | Status: DC | PRN

## 2015-02-14 MED ORDER — DOCUSATE SODIUM 100 MG PO CAPS: 100.0000 mg | ORAL_CAPSULE | Freq: Two times a day (BID) | ORAL | Status: DC

## 2015-02-14 NOTE — Discharge Instructions (Signed)

## 2015-02-14 NOTE — Progress Notes (Signed)
Patient discharged home with infant. Discharge instructions, prescriptions and follow up appointment given to and reviewed with patient. Patient verbalized understanding. Escorted out by Rondel JumboAnnette Thompson, NT.

## 2015-02-14 NOTE — Progress Notes (Signed)
Post Partum Day #2 s/p SVD with 3rd degree laceration repair  Subjective: no complaints, up ad lib, voiding and tolerating PO.  Notes episode of chills overnight and racing heart, followed by an episode of night sweats.   Objective: Filed Vitals:   02/13/15 2006 02/14/15 0014 02/14/15 0047 02/14/15 0826  BP: 130/82  118/71 108/63  Pulse: 104 88 88 78  Temp: 97.5 F (36.4 C) 97.8 F (36.6 C) 97.8 F (36.6 C) 97.8 F (36.6 C)  TempSrc: Oral Oral Oral Oral  Resp: 18 18 20 18   Height:      Weight:      SpO2:    100%    Physical Exam:  General: alert and no distress Lochia: appropriate Uterine Fundus: firm Incision: None DVT Evaluation: No evidence of DVT seen on physical exam. Negative Homan's sign. No cords or calf tenderness. No significant calf/ankle edema.   Recent Labs  02/11/15 2108 02/12/15 1358  HGB 13.6 11.6*  HCT 41.6 35.9    Assessment/Plan: Vitals remain stable, pulse wnl.  Is s/p consultation with cardiology this hospitalization for sinus tachycardia vs SVT.  No new recommendations made.  Continue routine postpartum care.  Desires circumcision for female infant. To be performed outpatient. Plans to breastfeed.  Is s/p lactation consult. Desires Mirena IUD for contraception. Dispo: d/c home today.     LOS: 3 days   Sandra Rivera 02/14/2015, 9:14 AM

## 2015-02-14 NOTE — Discharge Summary (Signed)
Obstetric Discharge Summary Reason for Admission: onset of labor Prenatal Procedures: ultrasound Intrapartum Procedures: spontaneous vaginal delivery Postpartum Procedures: none Complications-Operative and Postpartum: 3rd degree perineal laceration and episode of sinus tachycardia vs SVT.  Consulted Cardiology while inpatient.   HEMOGLOBIN  Date Value Ref Range Status  02/12/2015 11.6* 12.0 - 16.0 g/dL Final   HCT  Date Value Ref Range Status  02/12/2015 35.9 35.0 - 47.0 % Final   HEMATOCRIT  Date Value Ref Range Status  12/03/2014 36.5 34.0 - 46.6 % Final    Physical Exam:  General: alert and no distress Lochia: appropriate Uterine Fundus: firm Incision: None DVT Evaluation: No evidence of DVT seen on physical exam. Negative Homan's sign. No cords or calf tenderness. No significant calf/ankle edema.  Discharge Diagnoses: Term Pregnancy-delivered and Rh negative status  Discharge Information: Date: 02/14/2015 Activity: pelvic rest Diet: routine Medications: PNV, Ibuprofen and Colace Condition: stable Instructions: refer to practice specific booklet Discharge to: home Follow-up Information    Follow up with Sandra LaserAnika Leontine Radman, MD In 6 weeks.   Specialties:  Obstetrics and Gynecology,    Why:  Postpartum visit   Contact information:   [address] [place] [id number] [phone]       Newborn Data: Live born female  Birth Weight: 8 lb 6 oz (3800 g) APGAR: 9, 9  Home with mother.  Sandra Rivera 02/14/2015, 9:23 AM

## 2015-02-15 NOTE — Discharge Summary (Signed)
Obstetric Discharge Summary Reason for Admission: onset of labor Prenatal Procedures: ultrasound Intrapartum Procedures: spontaneous vaginal delivery Postpartum Procedures: Rho(D) Ig Complications-Operative and Postpartum: 3rd degree perineal laceration HEMOGLOBIN  Date Value Ref Range Status  02/12/2015 11.6* 12.0 - 16.0 g/dL Final   HCT  Date Value Ref Range Status  02/12/2015 35.9 35.0 - 47.0 % Final   HEMATOCRIT  Date Value Ref Range Status  12/03/2014 36.5 34.0 - 46.6 % Final    Physical Exam:  General: alert and no distress Lochia: appropriate Uterine Fundus: firm Incision: None DVT Evaluation: No evidence of DVT seen on physical exam. Negative Homan's sign. No cords or calf tenderness. No significant calf/ankle edema.  Discharge Diagnoses: Term Pregnancy-delivered and Rh negative  Discharge Information: Date: 02/15/2015 Activity: pelvic rest Diet: routine Medications: PNV, Ibuprofen and Colace Condition: stable Instructions: refer to practice specific booklet Discharge to: home Follow-up Information    Follow up with Hildred LaserAnika Isidoro Santillana, MD In 6 weeks.   Specialties:  Obstetrics and Gynecology, Radiology   Why:  Please keep follow up appt. at [facility] with Dr. Valentino Saxonherry.    Contact information:   [address] [place] [address] [phone]       Newborn Data: Live born female  Birth Weight: 8 lb 6 oz (3800 g) APGAR: 9, 9  Home with mother.  Hildred Lasernika Orel Cooler 02/15/2015, 11:09 AM

## 2015-02-17 ENCOUNTER — Telehealth: Payer: Self-pay | Admitting: Obstetrics and Gynecology

## 2015-02-17 ENCOUNTER — Encounter: Payer: Medicaid Other | Admitting: Obstetrics and Gynecology

## 2015-02-17 NOTE — Telephone Encounter (Signed)
Contacted patient regarding previous phone call.  Patient notes that she has having side pain under right rib.  Unsure if it's her rib bone or underneath.  Notes that Tylenol #3 and Ibuprofen are not helping.  Difficulty to walk.  Also notes beginning to feel heart racing again.  Advised that if needed, she should go to the Emergency Room for further workup if symptoms persist or worsen.  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

## 2015-02-17 NOTE — Telephone Encounter (Signed)
PT IS IN A LOT OF PAIN AND NOT SURE IF SHE NEEDS TO COME IN AND BE SEEN OR WHAT NEEDS TO BE DONE, SHE IS HAVING SEVER BACK PAIN AT RIB AREA, SHE ISNT SURE IF SHE NEEDS X-RAY OR IF THIS IS NORMAL. SHE STATED SHE WOULD LIKE FOR YOU TO CALL HER.

## 2015-02-18 NOTE — Final Progress Note (Signed)
L&D OB Triage Note  Sandra Rivera is a 19 y.o. 161P1001 female at 8869w2d, EDD Estimated Date of Delivery: 02/26/15 who presented to triage for complaints of contractions and pelvic pain.  She was evaluated by the nurses with no significant findings (ruled out for labor). Vital signs stable. An NST was performed and has been reviewed by MD. She was treated.   NST INTERPRETATION: Indications: rule out uterine contractions  Mode: External Baseline Rate (A): 130 bpm Variability: Moderate Accelerations: 15 x 15 Decelerations: None     Contraction Frequency (min): irregular  Impression: reactive   Plan: NST performed was reviewed and was found to be reactive. She was discharged home with bleeding/labor precautions.  Continue routine prenatal care. Follow up with OB/GYN as previously scheduled.     Hildred LaserAnika Ginevra Tacker, MD

## 2015-02-19 ENCOUNTER — Telehealth: Payer: Self-pay | Admitting: Obstetrics and Gynecology

## 2015-02-19 DIAGNOSIS — F419 Anxiety disorder, unspecified: Principal | ICD-10-CM

## 2015-02-19 DIAGNOSIS — F329 Major depressive disorder, single episode, unspecified: Secondary | ICD-10-CM

## 2015-02-19 MED ORDER — SERTRALINE HCL 25 MG PO TABS: 25.0000 mg | ORAL_TABLET | Freq: Every day | ORAL | Status: DC

## 2015-02-19 NOTE — Telephone Encounter (Signed)
RX sent in

## 2015-02-19 NOTE — Telephone Encounter (Signed)
Pt needs zoloft refilled

## 2015-02-24 ENCOUNTER — Ambulatory Visit (INDEPENDENT_AMBULATORY_CARE_PROVIDER_SITE_OTHER): Payer: Medicaid Other | Admitting: Obstetrics and Gynecology

## 2015-02-24 ENCOUNTER — Ambulatory Visit: Payer: Medicaid Other | Admitting: Obstetrics and Gynecology

## 2015-02-24 ENCOUNTER — Encounter: Payer: Self-pay | Admitting: Obstetrics and Gynecology

## 2015-02-24 VITALS — BP 108/73 | HR 103 | Ht 66.5 in | Wt 192.0 lb

## 2015-02-24 DIAGNOSIS — F53 Postpartum depression: Secondary | ICD-10-CM

## 2015-02-24 DIAGNOSIS — M5489 Other dorsalgia: Secondary | ICD-10-CM

## 2015-02-24 DIAGNOSIS — O99345 Other mental disorders complicating the puerperium: Secondary | ICD-10-CM

## 2015-02-24 MED ORDER — MELOXICAM 15 MG PO TABS: 15.0000 mg | ORAL_TABLET | Freq: Two times a day (BID) | ORAL | Status: DC | PRN

## 2015-02-24 MED ORDER — CYCLOBENZAPRINE HCL 10 MG PO TABS
10.0000 mg | ORAL_TABLET | Freq: Three times a day (TID) | ORAL | Status: DC | PRN
Start: 2015-02-24 — End: 2015-03-31

## 2015-02-24 NOTE — Progress Notes (Signed)
GYNECOLOGY PROGRESS NOTE  Subjective:    Patient ID: Sandra Rivera, female    DOB: 01/26/1996, 19 y.o.   MRN: [id number]  HPI  Patient is a 19 y.o. 331P1001 female who presents for complaints of right side pain underneath rib x 2 weeks.  Notes that the pain is described as deep, aching.  Has taken T#3 and Motrin with no relief. Also reports constipation x 1 week, relieved today with BM (hard stool), followed by diarrhea. Is taking Colace Of note, patient is 2 weeks s/p SVD.   Also, patient resumed Zoloft last week due to recurrence of symptoms.  Notes feeling better on medication.   The following portions of the patient's history were reviewed and updated as appropriate: allergies, current medications, past family history, past medical history, past social history, past surgical history and problem list.  Review of Systems Pertinent items noted in HPI and remainder of comprehensive ROS otherwise negative.   Objective:   Blood pressure 108/73, pulse 103, height 5' 6.5" (1.689 m), weight 192 lb (87.091 kg), not currently breastfeeding. General appearance: alert and no distress Abdomen: soft, non-tender; bowel sounds normal; no masses,  no organomegaly Back: symmetric, no curvature. ROM normal. No CVA tenderness., mild point tenderness on right side under 9th and 10th ribs.  Pelvic: deferred   Assessment:   Right side pain. H/o depression in pregnancy, now postpartum  Plan:   1. Costochondritis vs other musculoskeletal pain.  Advised on continuing T#3 as needed, prescribed Mobic and Flexeril.  2. Depressive symptoms improved on Zoloft .  3. RTC in 4 weeks for routine postpartum visit.    Sandra LaserAnika Keisa Blow, MD [facility]

## 2015-03-26 ENCOUNTER — Encounter: Payer: Self-pay | Admitting: Family

## 2015-03-31 ENCOUNTER — Encounter: Payer: Self-pay | Admitting: Obstetrics and Gynecology

## 2015-03-31 ENCOUNTER — Ambulatory Visit (INDEPENDENT_AMBULATORY_CARE_PROVIDER_SITE_OTHER): Payer: Medicaid Other | Admitting: Obstetrics and Gynecology

## 2015-03-31 DIAGNOSIS — F419 Anxiety disorder, unspecified: Secondary | ICD-10-CM

## 2015-03-31 DIAGNOSIS — Z308 Encounter for other contraceptive management: Secondary | ICD-10-CM

## 2015-03-31 DIAGNOSIS — F418 Other specified anxiety disorders: Secondary | ICD-10-CM

## 2015-03-31 DIAGNOSIS — F329 Major depressive disorder, single episode, unspecified: Secondary | ICD-10-CM

## 2015-03-31 NOTE — Progress Notes (Signed)
   GYNECOLOGY POSTPARTUM CLINIC VISIT Subjective:     Sandra Rivera is a 20 y.o. G69P1001 female who presents for a postpartum visit. She is 6 weeks postpartum following a spontaneous vaginal delivery. I have fully reviewed the prenatal and intrapartum course. The delivery was at 38 gestational weeks. Anesthesia: epidural. Postpartum course has been well. Baby's course has been well. Baby is feeding by bottle. Bleeding currently has resumed menses (LMP 03/28/2015). Bowel function is normal. Bladder function is normal. Patient is not sexually active. Contraception method is IUD. Postpartum depression screening: positive (PHQ-9 score is 11).  Is currently on Zoloft for depression (self-initiated posptartum after ~ 1 week).  Notes that the current dose was helping until several weeks ago when infant was hospitalized for RSV infection.  Since then, she feels more agitated, temperamental, and emotional.  Notes that she feels that she can't handle when the baby cries, and that she has to give the baby to her mom and leave the room.  Has begun having panic attacks again.   Denies SI/HI.   The following portions of the patient's history were reviewed and updated as appropriate: allergies, current medications, past family history, past medical history, past social history, past surgical history and problem list.  Review of Systems Pertinent items noted in HPI and remainder of comprehensive ROS otherwise negative.   Objective:    BP 117/76 mmHg  Pulse 88  Wt 191 lb 5 oz (86.779 kg)  LMP 03/28/2015 (Exact Date)  Breastfeeding? No  General:  alert and no distress   Breasts:  inspection negative, no nipple discharge or bleeding, no masses or nodularity palpable  Lungs: clear to auscultation bilaterally  Heart:  regular rate and rhythm, S1, S2 normal, no murmur, click, rub or gallop  Abdomen: soft, non-tender; bowel sounds normal; no masses,  no organomegaly   Vulva:  normal  Vagina: normal vagina, no  discharge, exudate, lesion, or erythema  Cervix:  multiparous appearance, no cervical motion tenderness and no lesions  Corpus: normal size, contour, position, consistency, mobility, non-tender  Adnexa:  normal adnexa  Rectal Exam: Not performed.          Labs:  Lab Results  Component Value Date   HGB 11.6* 02/12/2015    Assessment:    Routine postpartum exam. Pap smear not done at today's visit.  Anxiety and depression Contraception management.  Plan:    1. Contraception: Mirena IUD.  Will have patient f/u in 1 week for placement.  2. Anxiety/depression.  Patient notes that she was previously seeing a Veterinary surgeon.  Would recommend referral to Psychiatry for further management.  Advised patient that she can increase her Zoloft from 25 mg daily to 50 mg daily.    Hildred Laser, MD [facility]

## 2015-04-06 ENCOUNTER — Ambulatory Visit: Payer: Medicaid Other | Admitting: Licensed Clinical Social Worker

## 2015-04-09 ENCOUNTER — Ambulatory Visit: Payer: Medicaid Other | Admitting: Obstetrics and Gynecology

## 2015-04-09 ENCOUNTER — Encounter: Payer: Self-pay | Admitting: Obstetrics and Gynecology

## 2015-04-09 ENCOUNTER — Ambulatory Visit (INDEPENDENT_AMBULATORY_CARE_PROVIDER_SITE_OTHER): Payer: Medicaid Other | Admitting: Obstetrics and Gynecology

## 2015-04-09 VITALS — BP 143/84 | HR 103 | Ht 66.5 in | Wt 191.4 lb

## 2015-04-09 DIAGNOSIS — Z7189 Other specified counseling: Secondary | ICD-10-CM | POA: Diagnosis not present

## 2015-04-09 DIAGNOSIS — Z7689 Persons encountering health services in other specified circumstances: Secondary | ICD-10-CM

## 2015-04-09 MED ORDER — ACETAMINOPHEN-CODEINE #3 300-30 MG PO TABS: 1.0000 | ORAL_TABLET | Freq: Four times a day (QID) | ORAL | Status: DC | PRN

## 2015-04-09 NOTE — Patient Instructions (Signed)

## 2015-04-09 NOTE — Progress Notes (Signed)
    GYNECOLOGY CLINIC PROCEDURE NOTE  Sandra Rivera is a 20 y.o. G1P1001 here for Mirena IUD insertion. No GYN concerns.  No previous pap history.  Patient does continue to note upper right sided back pain.  Desires refill on T#3.   IUD Insertion Procedure Note Patient identified, informed consent performed, consent signed.   Discussed risks of irregular bleeding, cramping, infection, malpositioning or misplacement of the IUD outside the uterus which may require further procedure such as laparoscopy. Time out was performed.  Urine pregnancy not performed as patient currently on menses.   Speculum placed in the vagina.  Cervix visualized.  Cleaned with Betadine x 2.  Grasped anteriorly with a single tooth tenaculum.  Uterus sounded to 9 cm.  Mirena IUD placed per manufacturer's recommendations.  Strings trimmed to 3 cm. Tenaculum was removed, good hemostasis noted.  Patient tolerated procedure well.   Patient was given post-procedure instructions.  She was advised to have backup contraception for one week.  Patient was also asked to check IUD strings periodically and follow up in 4 weeks for IUD check.  Refill given on T#3, and referred to [facility] to establish care.     Hildred Laser, MD [facility]

## 2015-04-13 ENCOUNTER — Ambulatory Visit (INDEPENDENT_AMBULATORY_CARE_PROVIDER_SITE_OTHER): Payer: MEDICAID | Admitting: Licensed Clinical Social Worker

## 2015-04-13 DIAGNOSIS — F329 Major depressive disorder, single episode, unspecified: Secondary | ICD-10-CM | POA: Diagnosis not present

## 2015-04-13 DIAGNOSIS — F411 Generalized anxiety disorder: Secondary | ICD-10-CM

## 2015-04-13 DIAGNOSIS — F32A Depression, unspecified: Secondary | ICD-10-CM

## 2015-04-13 NOTE — Progress Notes (Signed)
Comprehensive Clinical Assessment (CCA) Note  04/13/2015 Sandra Rivera [id number]  Visit Diagnosis:      ICD-9-CM ICD-10-CM   1. Generalized anxiety disorder 300.02 F41.1   2. Depression 311 F32.9       CCA Part One  Part One has been completed on paper by the patient.  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", work Engineer, mining Preferences: to not to stress Individual's Abilities: motivated for treatment Type of Services Patient Feels Are Needed: medication management, outpatient therapy  Mental Health Symptoms Depression:  Depression: Irritability, Difficulty Concentrating, Change in energy/activity  Mania:  Mania: N/A  Anxiety:   Anxiety: Difficulty concentrating, Fatigue, Irritability, Restlessness, Sleep, Worrying  Psychosis:  Psychosis: N/A  Trauma:  Trauma: N/A  Obsessions:  Obsessions: N/A  Compulsions:  Compulsions: N/A  Inattention:  Inattention: N/A  Hyperactivity/Impulsivity:  Hyperactivity/Impulsivity: N/A  Oppositional/Defiant Behaviors:     Borderline Personality:  Emotional Irregularity: N/A  Other Mood/Personality Symptoms:      Mental Status Exam Appearance and self-care  Stature:  Stature: Average  Weight:  Weight: Average weight  Clothing:  Clothing: Casual  Grooming:  Grooming: Normal  Cosmetic use:  Cosmetic Use: Age appropriate  Posture/gait:  Posture/Gait: Normal  Motor activity:  Motor Activity: Not Remarkable  Sensorium  Attention:  Attention: Normal  Concentration:  Concentration:  Normal  Orientation:  Orientation: X5  Recall/memory:  Recall/Memory: Normal  Affect and Mood  Affect:  Affect: Appropriate  Mood:  Mood: Anxious  Relating  Eye contact:  Eye Contact: Normal  Facial expression:  Facial Expression: Sad  Attitude toward examiner:  Attitude Toward Examiner: Cooperative  Thought and Language  Speech flow: Speech Flow: Normal  Thought content:  Thought Content: Appropriate to mood and circumstances  Preoccupation:     Hallucinations:     Organization:     Company secretary of Knowledge:  Fund of Knowledge: Average  Intelligence:  Intelligence: Average  Abstraction:  Abstraction: Normal  Judgement:  Judgement: Normal  Reality Testing:  Reality Testing: Adequate  Insight:  Insight: Good  Decision Making:  Decision Making: Normal  Social Functioning  Social Maturity:  Social Maturity: Responsible  Social Judgement:  Social Judgement: Naive  Stress  Stressors:  Stressors: Family conflict, Grief/losses, Transitions, Arts administrator, Work  Coping Ability:  Coping Ability: Building surveyor Deficits:     Supports:      Family and Psychosocial History: Family history Marital status: Single Are you sexually active?: Yes What is your sexual orientation?: heterosexual Does patient have children?: Yes How many children?: 1 How is patient's relationship with their children?: has a 81 week old son  Childhood History:  Childhood History By whom was/is the patient raised?: Mother/father and step-parent Description of patient's relationship with caregiver when they were a child: Mother:"we have the best relationsship." Father: "we never really spoke." Patient's description of current relationship with people who raised him/her: Mother: "she's great" Dad: we talk occasionally How were you disciplined when you got in trouble as a child/adolescent?: grounded Does patient have siblings?: Yes Number of Siblings: 3 Description of patient's  current relationship  with siblings: odler half brother whose 56; i have a younger half brother who is 51 and a younger half sister who is 7 Did patient suffer any verbal/emotional/physical/sexual abuse as a child?: No Did patient suffer from severe childhood neglect?: No Has patient ever been sexually abused/assaulted/raped as an adolescent or adult?: No Was the patient ever a victim of a crime or a disaster?: No Witnessed domestic violence?: No Has patient been effected by domestic violence as an adult?: No  CCA Part Two B  Employment/Work Situation: Employment / Work Psychologist, occupational Employment situation: Biomedical scientist job has been impacted by current illness: Yes Describe how patient's job has been impacted: "I left my job when I found out that I was pregnant due to the stress" What is the longest time patient has a held a job?: 4-5 months 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 Has patient ever been in the [employer]?: No  Education: Education Last Grade Completed: 12 Name of High School: [employer] (homeschooled) Did Garment/textile technologist From [employer]?: Yes Did Theme park manager?: No Did You Attend Graduate School?: No Did You Have An Individualized Education Program (IIEP): No Did You Have Any Difficulty At School?: Yes (Dyslexia) Were Any Medications Ever Prescribed For These Difficulties?: No  Religion: Religion/Spirituality Are You A Religious Person?: Yes What is Your Religious Affiliation?: Catholic How Might This Affect Treatment?: denies  Leisure/Recreation: Leisure / Recreation Leisure and Hobbies: riding my horse, hanging out with friends  Exercise/Diet: Exercise/Diet Do You Exercise?: Yes What Type of Exercise Do You Do?: Dance How Many Times a Week Do You Exercise?: 6-7 times a week Have You Gained or Lost A Significant Amount of Weight in the Past Six Months?:  (while pregnant she gained 50 pounds;  has  66 week old son) Do You Follow a Special Diet?: No Do You  Have Any Trouble Sleeping?: Yes Explanation of Sleeping Difficulties: has an 64 week old son  CCA Part Two C  Alcohol/Drug Use: Alcohol / Drug Use Pain Medications: muscle relaxer (name unknown) Prescriptions: Zoloft  Over the Counter: prenatal vitamins, Excedrin History of alcohol / drug use?: No history of alcohol / drug abuse                      CCA Part Three  ASAM's:  Six Dimensions of Multidimensional Assessment  Dimension 1:  Acute Intoxication and/or Withdrawal Potential:     Dimension 2:  Biomedical Conditions and Complications:     Dimension 3:  Emotional, Behavioral, or Cognitive Conditions and Complications:     Dimension 4:  Readiness to Change:     Dimension 5:  Relapse, Continued use, or Continued Problem Potential:     Dimension 6:  Recovery/Living Environment:      Substance use Disorder (SUD)    Social Function:  Social Functioning Social Maturity: Responsible Social Judgement: Naive  Stress:  Stress Stressors: Family conflict, Grief/losses, Transitions, Arts administrator, Work Coping Ability: Overwhelmed Patient Takes Medications The Way The Doctor Instructed?: Yes Priority Risk: Moderate Risk  Risk Assessment- Self-Harm Potential: Risk Assessment For Self-Harm Potential Thoughts of Self-Harm: No current thoughts Method: No plan Availability of Means: No access/NA  Risk Assessment -Dangerous to Others Potential: Risk Assessment For Dangerous to Others Potential Method: No Plan Intent: Vague intent or NA Notification Required: No need or identified person  DSM5 Diagnoses: Patient Active Problem List   Diagnosis Date Noted  . Spontaneous vaginal delivery 02/14/2015  .  Laceration of tissues between vaginal and perineal muscular layers and rectal mucosa 02/14/2015  . Irregular uterine contractions 02/11/2015  . Irregular contractions 02/11/2015  . Labor and delivery indication for care or intervention 02/11/2015  . Excessive weight gain during  pregnancy 02/10/2015  . Preterm contractions 01/31/2015  . Depressed mood 12/03/2014  . Anxiety 11/24/2014  . Gastroesophageal reflux in pregancy 10/08/2014  . Tobacco abuse 08/14/2014  . Rh negative state in antepartum period 08/14/2014  . Sternal fracture 03/06/2014  . Migraine without aura 05/17/2013  . Episodic tension type headache 05/17/2013      Recommendations for Services/Supports/Treatments: Recommendations for Services/Supports/Treatments Recommendations For Services/Supports/Treatments: Individual Therapy, Medication Management  Treatment Plan Summary:    Referrals to Alternative Service(s): Referred to Alternative Service(s):   Place:   Date:   Time:    Referred to Alternative Service(s):   Place:   Date:   Time:    Referred to Alternative Service(s):   Place:   Date:   Time:    Referred to Alternative Service(s):   Place:   Date:   Time:     Marinda Elk

## 2015-04-14 ENCOUNTER — Telehealth: Payer: Self-pay | Admitting: Obstetrics and Gynecology

## 2015-04-14 MED ORDER — SERTRALINE HCL 50 MG PO TABS: 50.0000 mg | ORAL_TABLET | Freq: Every day | ORAL | Status: DC

## 2015-04-14 NOTE — Telephone Encounter (Signed)
Pt has been out of zoloft for 2 days. Can you send to the pharmacy

## 2015-04-14 NOTE — Telephone Encounter (Signed)
Prescription sent in, patient notified.

## 2015-05-07 ENCOUNTER — Telehealth: Payer: Self-pay | Admitting: Obstetrics and Gynecology

## 2015-05-07 ENCOUNTER — Ambulatory Visit: Payer: Medicaid Other | Admitting: Obstetrics and Gynecology

## 2015-05-07 NOTE — Telephone Encounter (Signed)
Pt states that she is having bleeding after her IUD insertion. Pt states that she is having light spotting, every other day. Advised pt that is normal to have irregular bleeding for up to 3 months after insertion. Pt gave verbal understanding.

## 2015-05-07 NOTE — Telephone Encounter (Signed)
PT WANTED A CALL BACK..SHE STATED THAT EVER SINCE THE IUD WAS PLACED SHE HAS BEEN BLEEDING AND WANTED TO KNOW IF THAT WAS NORMAL, SHE WAS SCHEDULED FOR AN APPT TODAY BUT WAS LATE. SHE DID WANT ME TO SEND THIS MESSAGE AND ASK.

## 2015-05-20 ENCOUNTER — Ambulatory Visit (INDEPENDENT_AMBULATORY_CARE_PROVIDER_SITE_OTHER): Payer: Medicaid Other | Admitting: Obstetrics and Gynecology

## 2015-05-20 ENCOUNTER — Encounter: Payer: Self-pay | Admitting: Obstetrics and Gynecology

## 2015-05-20 VITALS — BP 105/68 | HR 96 | Ht 66.5 in | Wt 189.1 lb

## 2015-05-20 DIAGNOSIS — Z30431 Encounter for routine checking of intrauterine contraceptive device: Secondary | ICD-10-CM | POA: Diagnosis not present

## 2015-05-20 NOTE — Progress Notes (Signed)
   GYNECOLOGY CLINIC PROGRESS NOTE  History:  20 y.o. G1P1001 here today for today for IUD string check; Mirena IUD was placed  04/09/2015. No complaints about the IUD, no concerning side effects.  The following portions of the patient's history were reviewed and updated as appropriate: allergies, current medications, past family history, past medical history, past social history, past surgical history and problem list. No prior pap history.  Review of Systems:  Pertinent items are noted in HPI.  Objective:  Physical Exam Blood pressure 105/68, pulse 96, height 5' 6.5" (1.689 m), weight 189 lb 1.6 oz (85.775 kg), not currently breastfeeding. No LMP recorded.  CONSTITUTIONAL: Well-developed, well-nourished female in no acute distress.  ABDOMEN: Soft, no distention noted.   PELVIC: Normal appearing external genitalia; normal appearing vaginal mucosa and cervix.  IUD strings visualized, about 3 cm in length outside cervix.  EXTREMITIES: No cyanosis or edema, nontender NEUROLOGIC: Grossly normal.   Assessment & Plan:  Normal IUD check. Patient to keep IUD in place for five years; can come in for removal if she desires pregnancy within the next five years. Routine preventative health maintenance measures emphasized.  To f/u in 9 months for annual exam.     Hildred LaserAnika Latresa Gasser, MD [facility]

## 2015-06-04 ENCOUNTER — Encounter: Payer: Self-pay | Admitting: Psychiatry

## 2015-06-04 ENCOUNTER — Ambulatory Visit (INDEPENDENT_AMBULATORY_CARE_PROVIDER_SITE_OTHER): Payer: Medicaid Other | Admitting: Psychiatry

## 2015-06-04 VITALS — BP 118/76 | HR 82 | Temp 98.2°F | Ht 66.5 in | Wt 192.0 lb

## 2015-06-04 DIAGNOSIS — F331 Major depressive disorder, recurrent, moderate: Secondary | ICD-10-CM

## 2015-06-04 DIAGNOSIS — F411 Generalized anxiety disorder: Secondary | ICD-10-CM | POA: Diagnosis not present

## 2015-06-04 MED ORDER — HYDROXYZINE PAMOATE 25 MG PO CAPS: 25.0000 mg | ORAL_CAPSULE | Freq: Every evening | ORAL | Status: DC | PRN

## 2015-06-04 MED ORDER — ESCITALOPRAM OXALATE 10 MG PO TABS: 10.0000 mg | ORAL_TABLET | Freq: Every day | ORAL | Status: DC
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
## 2015-06-11 ENCOUNTER — Ambulatory Visit: Payer: Medicaid Other | Admitting: Psychiatry

## 2015-06-30 ENCOUNTER — Ambulatory Visit (INDEPENDENT_AMBULATORY_CARE_PROVIDER_SITE_OTHER): Payer: Self-pay | Admitting: Psychiatry

## 2015-09-22 ENCOUNTER — Other Ambulatory Visit: Payer: Self-pay

## 2015-09-22 NOTE — Progress Notes (Signed)
Refills sent via fax, encounter created in error. OG

## 2015-10-07 ENCOUNTER — Encounter: Payer: Self-pay | Admitting: Obstetrics and Gynecology

## 2015-10-07 ENCOUNTER — Ambulatory Visit (INDEPENDENT_AMBULATORY_CARE_PROVIDER_SITE_OTHER): Payer: Medicaid Other | Admitting: Obstetrics and Gynecology

## 2015-10-07 ENCOUNTER — Encounter (INDEPENDENT_AMBULATORY_CARE_PROVIDER_SITE_OTHER): Payer: Self-pay

## 2015-10-07 VITALS — BP 109/69 | HR 82 | Ht 66.5 in | Wt 193.8 lb

## 2015-10-07 DIAGNOSIS — G8929 Other chronic pain: Secondary | ICD-10-CM | POA: Diagnosis not present

## 2015-10-07 DIAGNOSIS — R102 Pelvic and perineal pain: Secondary | ICD-10-CM

## 2015-10-07 DIAGNOSIS — N949 Unspecified condition associated with female genital organs and menstrual cycle: Secondary | ICD-10-CM | POA: Diagnosis not present

## 2015-10-07 DIAGNOSIS — M549 Dorsalgia, unspecified: Secondary | ICD-10-CM

## 2015-10-07 DIAGNOSIS — Z975 Presence of (intrauterine) contraceptive device: Principal | ICD-10-CM

## 2015-10-07 DIAGNOSIS — N921 Excessive and frequent menstruation with irregular cycle: Secondary | ICD-10-CM | POA: Diagnosis not present

## 2015-10-07 MED ORDER — NORETHIN ACE-ETH ESTRAD-FE 1-20 MG-MCG PO TABS: 1.0000 | ORAL_TABLET | Freq: Every day | ORAL | 2 refills | Status: DC
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
## 2015-12-21 ENCOUNTER — Emergency Department
Admission: EM | Admit: 2015-12-21 | Discharge: 2015-12-21 | Disposition: A | Discharge status: Home / Self Care | Payer: Medicaid Other | Attending: Emergency Medicine | Admitting: Emergency Medicine

## 2015-12-21 ENCOUNTER — Encounter: Payer: Self-pay | Admitting: *Deleted

## 2015-12-21 DIAGNOSIS — Z30432 Encounter for removal of intrauterine contraceptive device: Secondary | ICD-10-CM | POA: Insufficient documentation

## 2015-12-21 DIAGNOSIS — R102 Pelvic and perineal pain: Secondary | ICD-10-CM

## 2015-12-21 DIAGNOSIS — Z79899 Other long term (current) drug therapy: Secondary | ICD-10-CM | POA: Insufficient documentation

## 2015-12-21 DIAGNOSIS — F17210 Nicotine dependence, cigarettes, uncomplicated: Secondary | ICD-10-CM | POA: Insufficient documentation

## 2015-12-21 DIAGNOSIS — R42 Dizziness and giddiness: Secondary | ICD-10-CM | POA: Diagnosis present

## 2015-12-21 LAB — CHLAMYDIA/NGC RT PCR (ARMC ONLY)
CHLAMYDIA TR: NOT DETECTED
N gonorrhoeae: NOT DETECTED

## 2015-12-21 LAB — WET PREP, GENITAL
Clue Cells Wet Prep HPF POC: NONE SEEN
SPERM: NONE SEEN
Trich, Wet Prep: NONE SEEN
Yeast Wet Prep HPF POC: NONE SEEN

## 2015-12-21 LAB — URINALYSIS COMPLETE WITH MICROSCOPIC (ARMC ONLY)
BILIRUBIN URINE: NEGATIVE
Bacteria, UA: NONE SEEN
Glucose, UA: NEGATIVE mg/dL
HGB URINE DIPSTICK: NEGATIVE
KETONES UR: NEGATIVE mg/dL
LEUKOCYTES UA: NEGATIVE
NITRITE: NEGATIVE
PH: 5 (ref 5.0–8.0)
PROTEIN: 30 mg/dL — AB
SPECIFIC GRAVITY, URINE: 1.028 (ref 1.005–1.030)

## 2015-12-21 LAB — POCT PREGNANCY, URINE: PREG TEST UR: NEGATIVE

## 2015-12-21 NOTE — ED Triage Notes (Signed)
Pt was at work felt a sudden onset of vaginal pain with an episode of dizziness, pt reports dizziness is gone, pt complains of vaginal pain/cramping

## 2015-12-21 NOTE — ED Notes (Signed)
No change in condition, pt waiting in lobby 
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
## 2015-12-22 ENCOUNTER — Encounter: Payer: Self-pay | Admitting: Obstetrics and Gynecology

## 2015-12-22 ENCOUNTER — Telehealth: Payer: Self-pay | Admitting: Obstetrics and Gynecology

## 2015-12-22 ENCOUNTER — Ambulatory Visit: Payer: Medicaid Other

## 2015-12-22 ENCOUNTER — Ambulatory Visit (INDEPENDENT_AMBULATORY_CARE_PROVIDER_SITE_OTHER): Payer: Medicaid Other | Admitting: Obstetrics and Gynecology

## 2015-12-22 VITALS — BP 107/71 | HR 87 | Ht 66.5 in | Wt 190.9 lb

## 2015-12-22 DIAGNOSIS — R102 Pelvic and perineal pain: Secondary | ICD-10-CM

## 2015-12-22 DIAGNOSIS — Z7689 Persons encountering health services in other specified circumstances: Secondary | ICD-10-CM | POA: Diagnosis not present

## 2015-12-22 DIAGNOSIS — L749 Eccrine sweat disorder, unspecified: Secondary | ICD-10-CM

## 2015-12-22 DIAGNOSIS — Z308 Encounter for other contraceptive management: Secondary | ICD-10-CM

## 2015-12-22 DIAGNOSIS — M545 Low back pain: Secondary | ICD-10-CM

## 2015-12-22 DIAGNOSIS — G8929 Other chronic pain: Secondary | ICD-10-CM

## 2015-12-22 MED ORDER — MEDROXYPROGESTERONE ACETATE 150 MG/ML IM SUSP
150.0000 mg | Freq: Once | INTRAMUSCULAR | Status: AC
  Administered 2015-12-22: 150 mg via INTRAMUSCULAR

## 2015-12-22 MED ORDER — MEDROXYPROGESTERONE ACETATE 150 MG/ML IM SUSP: 150.0000 mg | INTRAMUSCULAR | 0 refills | Status: DC

## 2015-12-22 MED ORDER — CYCLOBENZAPRINE HCL 10 MG PO TABS: 10.0000 mg | ORAL_TABLET | Freq: Three times a day (TID) | ORAL | 2 refills | Status: DC | PRN

## 2015-12-22 MED ORDER — TRAMADOL HCL 50 MG PO TABS: 50.0000 mg | ORAL_TABLET | Freq: Four times a day (QID) | ORAL | 0 refills | Status: DC | PRN

## 2015-12-22 NOTE — Telephone Encounter (Signed)
Please call pt and have her scheduled for an ER follow up, in our next available slot. Thanks

## 2015-12-22 NOTE — Telephone Encounter (Signed)
Pt was in severe pain and went to ER ,IUD was removed. Pt is having hot flashes and in a lot of pain now. Is that normal, DOES SHE NEED TO BE SEEN?  and she wants the depo inj asap.

## 2015-12-22 NOTE — Progress Notes (Signed)
GYNECOLOGY PROGRESS NOTE  Subjective:    Patient ID: Sandra Rivera, female    DOB: Apr 26, 1995, 20 y.o.   MRN: [id number]  HPI  Patient is a 20 y.o. G104P1001 female who presents for complaints of several episodes of dizziness and breaking out into sweats, also accompanied by vaginal pain and cramping.All occurred in 1 day. Did note some vaginal discharge, but not itchy or malodorous.Denied fevers and chills. Was seen in the Emergency Room yesterday, notes that she had her Mirena IUD removed as she though it might be causing her symptoms.  States that since removal, her pain and cramping is gone, but has still noticed one additional episode of cold sweats.   Of note, patient still complaining of intermittent episodes middle back pain, beneath ribs (more right than left).  Was previously being treated with T#3, but notes that it began to have less efficacy, and so she stopped taking it (several months ago).  Has not established care with a PCP as previously recommended.  pel The following portions of the patient's history were reviewed and updated as appropriate: allergies, current medications, past family history, past medical history, past social history, past surgical history and problem list.  Review of Systems Pertinent items noted in HPI and remainder of comprehensive ROS otherwise negative.   Objective:   Blood pressure 107/71, pulse 87, height 5' 6.5" (1.689 m), weight 190 lb 14.4 oz (86.6 kg), last menstrual period 11/21/2015, not currently breastfeeding. General appearance: alert and no distress  Neck: soft, supple, no thyromegaly or masses. Abdomen: soft, non-tender; bowel sounds normal; no masses,  no organomegaly Pelvic: deferred per pt request Extremities: extremities normal, atraumatic, no cyanosis or edema Neurologic: Grossly normal    Labs:   Results for orders placed or performed during the hospital encounter of 12/21/15  Chlamydia/NGC rt PCR ([facility] only)  Result  Value Ref Range   Specimen source GC/Chlam ENDOCERVICAL    Chlamydia Tr NOT DETECTED NOT DETECTED   N gonorrhoeae NOT DETECTED NOT DETECTED  Wet prep, genital  Result Value Ref Range   Yeast Wet Prep HPF POC NONE SEEN NONE SEEN   Trich, Wet Prep NONE SEEN NONE SEEN   Clue Cells Wet Prep HPF POC NONE SEEN NONE SEEN   WBC, Wet Prep HPF POC MODERATE (A) NONE SEEN   Sperm NONE SEEN   Urinalysis complete, with microscopic ([facility] only)  Result Value Ref Range   Color, Urine YELLOW (A) YELLOW   APPearance CLEAR (A) CLEAR   Glucose, UA NEGATIVE NEGATIVE mg/dL   Bilirubin Urine NEGATIVE NEGATIVE   Ketones, ur NEGATIVE NEGATIVE mg/dL   Specific Gravity, Urine 1.028 1.005 - 1.030   Hgb urine dipstick NEGATIVE NEGATIVE   pH 5.0 5.0 - 8.0   Protein, ur 30 (A) NEGATIVE mg/dL   Nitrite NEGATIVE NEGATIVE   Leukocytes, UA NEGATIVE NEGATIVE   RBC / HPF 0-5 0 - 5 RBC/hpf   WBC, UA 0-5 0 - 5 WBC/hpf   Bacteria, UA NONE SEEN NONE SEEN   Squamous Epithelial / LPF 0-5 (A) NONE SEEN   Mucous PRESENT   Pregnancy, urine POC  Result Value Ref Range   Preg Test, Ur NEGATIVE NEGATIVE     Assessment:   Pelvic pain Middle back pain Cold sweats Contraception management  Plan:   1. Pelvic pain.  Patient with no evidence of pelvic infection based on labs, IUD removed yesterday.  Notes that pain has resolved.   2. Middle back pain.  Patient with continued middle back and right rib pain. Denies trauma to region. Will refer again to establish care with PCP. Will prescribe trial of Tramadol as T#3 has been less effective.   3. Cold sweats - unknown cause.  Patient does have h/o anxiety, may be related.  Denies fevers, illness, general malaise. Possibly endocrine.  Can work up further with PCP if still present.  4. Contraception options discussed, patient desires Depo Provera.  Will order.  Patient to f/u in next several days for initial injection. To refrain from intercourse until injection received.       Hildred LaserAnika Yannis Gumbs, MD [facility]

## 2015-12-22 NOTE — Telephone Encounter (Signed)
SORRY FORGOT TO SAY WT BLOOD COUNT WAS HIGH IN ER

## 2015-12-23 ENCOUNTER — Telehealth: Payer: Self-pay | Admitting: Obstetrics and Gynecology

## 2015-12-23 NOTE — Telephone Encounter (Signed)
Pt was given tramadol and cant stay awake. Can Dr. Valentino Saxonherry send Tylenol 3 instead?

## 2015-12-23 NOTE — Telephone Encounter (Signed)
Please advise 

## 2015-12-24 MED ORDER — ACETAMINOPHEN-CODEINE #3 300-30 MG PO TABS: 1.0000 | ORAL_TABLET | Freq: Four times a day (QID) | ORAL | 0 refills | Status: DC | PRN

## 2015-12-24 NOTE — Telephone Encounter (Signed)
That's fine.  I will leave the prescription at the front desk for her.

## 2015-12-25 NOTE — Telephone Encounter (Signed)
Can you please let this pt know that rx is up front for. Thanks

## 2016-01-01 ENCOUNTER — Ambulatory Visit: Payer: Self-pay

## 2016-01-07 ENCOUNTER — Ambulatory Visit: Payer: Self-pay | Admitting: Obstetrics and Gynecology

## 2016-03-10 ENCOUNTER — Telehealth: Payer: Self-pay | Admitting: Obstetrics and Gynecology

## 2016-03-10 DIAGNOSIS — Z3042 Encounter for surveillance of injectable contraceptive: Secondary | ICD-10-CM

## 2016-03-10 MED ORDER — MEDROXYPROGESTERONE ACETATE 150 MG/ML IM SUSP: 150.0000 mg | INTRAMUSCULAR | 3 refills | Status: DC

## 2016-03-10 NOTE — Telephone Encounter (Signed)
Please see previous telephone encounter.

## 2016-03-10 NOTE — Telephone Encounter (Signed)
RX sent in

## 2016-03-10 NOTE — Telephone Encounter (Signed)
Pharmacy called and stated that they need a refill on the pts depo to be sent. Pt has appt on 03/15/16 for her next injection.

## 2016-03-10 NOTE — Telephone Encounter (Signed)
Patient needs a refill on depo sent to [facility] in [place]. Thanks

## 2016-03-11 ENCOUNTER — Ambulatory Visit: Payer: Self-pay

## 2016-03-15 ENCOUNTER — Ambulatory Visit (INDEPENDENT_AMBULATORY_CARE_PROVIDER_SITE_OTHER): Payer: Medicaid Other | Admitting: Obstetrics and Gynecology

## 2016-03-15 VITALS — BP 113/71 | HR 83 | Wt 192.4 lb

## 2016-03-15 DIAGNOSIS — Z3040 Encounter for surveillance of contraceptives, unspecified: Secondary | ICD-10-CM

## 2016-03-15 DIAGNOSIS — Z3042 Encounter for surveillance of injectable contraceptive: Secondary | ICD-10-CM

## 2016-03-15 MED ORDER — MEDROXYPROGESTERONE ACETATE 150 MG/ML IM SUSP
150.0000 mg | Freq: Once | INTRAMUSCULAR | Status: AC
  Administered 2016-03-15: 150 mg via INTRAMUSCULAR

## 2016-03-15 NOTE — Progress Notes (Signed)
Patient ID: Sandra Rivera, female   DOB: 29-Jan-1996, 21 y.o.   MRN: [id number] Pt presents for Depo-Provera injection. Continues to have pelvic pain, no vaginal bleeding. Pt aware an annual is due but [place] as we are listed as primary.

## 2016-06-03 ENCOUNTER — Ambulatory Visit: Payer: Medicaid Other

## 2016-06-06 ENCOUNTER — Telehealth: Payer: Self-pay | Admitting: *Deleted

## 2016-06-06 NOTE — Telephone Encounter (Signed)
Called pt no answer. LM for pt informing her that she will have to speak with her PCP about whether or not they will be able to administer depo. Also advised pt that the health dept is an option for her to receive depo.

## 2016-06-06 NOTE — Telephone Encounter (Signed)
Patient called and states she is having some insurance problem with medicaid and she needs to get her depo injection this week. Patient states she has a new PCP and is wondering if that Dr can give her the injection instead of Dr. Valentino Saxonherry. Patient is requesting a call back. Her number is [phone]

## 2016-08-10 ENCOUNTER — Telehealth (INDEPENDENT_AMBULATORY_CARE_PROVIDER_SITE_OTHER): Payer: Self-pay | Admitting: Family

## 2016-08-10 NOTE — Telephone Encounter (Signed)
°  Who's calling (name and relationship to patient) : Sandra Rivera (pt) Best contact number: [phone] Provider they see: [facility] Reason for call: Patient stated she need to know the medication that she was taking with her onset of seizures.   Please call the patient.  Last seen 11/2013 and needed the medication that she start taking for preventative care. She saw Dr Sharene SkeansHickling then Dr Inetta Fermoina.  She stated she can explain it in details when called.      PRESCRIPTION REFILL ONLY  Name of prescription:  Pharmacy:

## 2016-08-10 NOTE — Telephone Encounter (Signed)
I called the patient and answered her questions concerning preventative and abortive medications that she took in the past.

## 2016-09-13 ENCOUNTER — Emergency Department
Admission: EM | Admit: 2016-09-13 | Discharge: 2016-09-13 | Disposition: A | Discharge status: Home / Self Care | Payer: Medicaid Other | Attending: Emergency Medicine | Admitting: Emergency Medicine

## 2016-09-13 ENCOUNTER — Encounter: Payer: Self-pay | Admitting: Emergency Medicine

## 2016-09-13 DIAGNOSIS — R5383 Other fatigue: Secondary | ICD-10-CM | POA: Diagnosis not present

## 2016-09-13 DIAGNOSIS — R531 Weakness: Secondary | ICD-10-CM | POA: Diagnosis present

## 2016-09-13 DIAGNOSIS — F17210 Nicotine dependence, cigarettes, uncomplicated: Secondary | ICD-10-CM | POA: Diagnosis not present

## 2016-09-13 DIAGNOSIS — R110 Nausea: Secondary | ICD-10-CM | POA: Diagnosis not present

## 2016-09-13 DIAGNOSIS — E860 Dehydration: Secondary | ICD-10-CM | POA: Insufficient documentation

## 2016-09-13 LAB — URINALYSIS, COMPLETE (UACMP) WITH MICROSCOPIC
Bilirubin Urine: NEGATIVE
GLUCOSE, UA: NEGATIVE mg/dL
HGB URINE DIPSTICK: NEGATIVE
Ketones, ur: NEGATIVE mg/dL
Leukocytes, UA: NEGATIVE
NITRITE: NEGATIVE
PROTEIN: NEGATIVE mg/dL
SPECIFIC GRAVITY, URINE: 1.02 (ref 1.005–1.030)
pH: 7 (ref 5.0–8.0)

## 2016-09-13 LAB — CBC WITH DIFFERENTIAL/PLATELET
BASOS PCT: 0 %
Basophils Absolute: 0 10*3/uL (ref 0–0.1)
EOS ABS: 0 10*3/uL (ref 0–0.7)
EOS PCT: 1 %
HCT: 43.6 % (ref 35.0–47.0)
Hemoglobin: 14.6 g/dL (ref 12.0–16.0)
LYMPHS ABS: 0.5 10*3/uL — AB (ref 1.0–3.6)
Lymphocytes Relative: 7 %
MCH: 26.9 pg (ref 26.0–34.0)
MCHC: 33.4 g/dL (ref 32.0–36.0)
MCV: 80.4 fL (ref 80.0–100.0)
MONOS PCT: 4 %
Monocytes Absolute: 0.3 10*3/uL (ref 0.2–0.9)
Neutro Abs: 7.1 10*3/uL — ABNORMAL HIGH (ref 1.4–6.5)
Neutrophils Relative %: 88 %
PLATELETS: 192 10*3/uL (ref 150–440)
RBC: 5.42 MIL/uL — ABNORMAL HIGH (ref 3.80–5.20)
RDW: 13.1 % (ref 11.5–14.5)
WBC: 8 10*3/uL (ref 3.6–11.0)

## 2016-09-13 LAB — COMPREHENSIVE METABOLIC PANEL
ALK PHOS: 52 U/L (ref 38–126)
ALT: 19 U/L (ref 14–54)
AST: 20 U/L (ref 15–41)
Albumin: 4.2 g/dL (ref 3.5–5.0)
Anion gap: 7 (ref 5–15)
BUN: 15 mg/dL (ref 6–20)
CALCIUM: 9 mg/dL (ref 8.9–10.3)
CHLORIDE: 105 mmol/L (ref 101–111)
CO2: 26 mmol/L (ref 22–32)
CREATININE: 0.77 mg/dL (ref 0.44–1.00)
Glucose, Bld: 102 mg/dL — ABNORMAL HIGH (ref 65–99)
Potassium: 3.7 mmol/L (ref 3.5–5.1)
SODIUM: 138 mmol/L (ref 135–145)
Total Bilirubin: 0.7 mg/dL (ref 0.3–1.2)
Total Protein: 7 g/dL (ref 6.5–8.1)

## 2016-09-13 LAB — POCT PREGNANCY, URINE: PREG TEST UR: NEGATIVE

## 2016-09-13 LAB — LIPASE, BLOOD: Lipase: 25 U/L (ref 11–51)

## 2016-09-13 MED ORDER — IBUPROFEN 600 MG PO TABS
600.0000 mg | ORAL_TABLET | Freq: Once | ORAL | Status: AC
  Administered 2016-09-13: 600 mg via ORAL

## 2016-09-13 MED ORDER — ONDANSETRON HCL 4 MG/2ML IJ SOLN
4.0000 mg | Freq: Once | INTRAMUSCULAR | Status: AC
  Administered 2016-09-13: 4 mg via INTRAVENOUS

## 2016-09-13 MED ORDER — ONDANSETRON HCL 4 MG/2ML IJ SOLN
INTRAMUSCULAR | Status: AC
  Filled 2016-09-13: qty 2

## 2016-09-13 MED ORDER — SODIUM CHLORIDE 0.9 % IV BOLUS (SEPSIS)
1000.0000 mL | Freq: Once | INTRAVENOUS | Status: AC
  Administered 2016-09-13: 1000 mL via INTRAVENOUS

## 2016-09-13 MED ORDER — IBUPROFEN 600 MG PO TABS
ORAL_TABLET | ORAL | Status: AC
  Administered 2016-09-13: 600 mg via ORAL
  Filled 2016-09-13: qty 1

## 2016-09-13 NOTE — ED Notes (Signed)
ED Provider at bedside. 

## 2016-09-13 NOTE — Discharge Instructions (Signed)
You were evaluated for fatigue and nausea, and treated for dehydration in the emergency department. The rest of your exam and evaluation are overall reassuring in the emergency department stay.  Return to the emergency room immediately for any worsening symptoms including skin rash, fevers, cough or trouble breathing, confusion or altered mental status, no worsening headache, abdominal pain, pelvic pain, vaginal discharge or bleeding, or any other symptoms concerning to you.
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
## 2016-09-13 NOTE — ED Triage Notes (Signed)
Pt c/o low back pain, right hip pain, weakness, nausea, left breast pain, decreased appetite.  Pt ambulatory to triage. NAD other than appears mildly anxious. Respirations unlabored.

## 2016-09-13 NOTE — ED Notes (Addendum)
Back pain, generalized fatigue, decreased appetite that started last night.

## 2016-09-13 NOTE — ED Notes (Signed)
Pt alert and oriented X4, active, cooperative, pt in NAD. RR even and unlabored, color WNL.  Pt informed to return if any life threatening symptoms occur.   

## 2016-09-15 LAB — URINE CULTURE: Culture: 50000 — AB

## 2016-12-13 ENCOUNTER — Other Ambulatory Visit: Payer: Self-pay | Admitting: Nurse Practitioner

## 2016-12-13 DIAGNOSIS — R413 Other amnesia: Secondary | ICD-10-CM

## 2017-01-06 ENCOUNTER — Ambulatory Visit
Admission: RE | Admit: 2017-01-06 | Discharge: 2017-01-06 | Disposition: A | Discharge status: Home / Self Care | Payer: Medicaid Other | Source: Ambulatory Visit | Attending: Nurse Practitioner | Admitting: Nurse Practitioner

## 2017-01-06 DIAGNOSIS — R930 Abnormal findings on diagnostic imaging of skull and head, not elsewhere classified: Secondary | ICD-10-CM | POA: Insufficient documentation

## 2017-01-06 DIAGNOSIS — R413 Other amnesia: Secondary | ICD-10-CM | POA: Insufficient documentation

## 2017-01-06 MED ORDER — GADOBENATE DIMEGLUMINE 529 MG/ML IV SOLN
17.0000 mL | Freq: Once | INTRAVENOUS | Status: AC | PRN
  Administered 2017-01-06: 17 mL via INTRAVENOUS

## 2017-02-24 ENCOUNTER — Emergency Department
Admission: EM | Admit: 2017-02-24 | Discharge: 2017-02-24 | Disposition: A | Discharge status: Home / Self Care | Payer: Medicaid Other | Attending: Student in an Organized Health Care Education/Training Program | Admitting: Student in an Organized Health Care Education/Training Program

## 2017-02-24 ENCOUNTER — Other Ambulatory Visit: Payer: Self-pay

## 2017-02-24 ENCOUNTER — Encounter: Payer: Self-pay | Admitting: Emergency Medicine

## 2017-02-24 DIAGNOSIS — F17210 Nicotine dependence, cigarettes, uncomplicated: Secondary | ICD-10-CM | POA: Insufficient documentation

## 2017-02-24 DIAGNOSIS — R6883 Chills (without fever): Secondary | ICD-10-CM | POA: Insufficient documentation

## 2017-02-24 DIAGNOSIS — J020 Streptococcal pharyngitis: Secondary | ICD-10-CM | POA: Insufficient documentation

## 2017-02-24 DIAGNOSIS — R42 Dizziness and giddiness: Secondary | ICD-10-CM | POA: Insufficient documentation

## 2017-02-24 DIAGNOSIS — M791 Myalgia, unspecified site: Secondary | ICD-10-CM | POA: Insufficient documentation

## 2017-02-24 DIAGNOSIS — Z79899 Other long term (current) drug therapy: Secondary | ICD-10-CM | POA: Insufficient documentation

## 2017-02-24 LAB — POCT RAPID STREP A: STREPTOCOCCUS, GROUP A SCREEN (DIRECT): POSITIVE — AB

## 2017-02-24 MED ORDER — LIDOCAINE VISCOUS 2 % MT SOLN
15.0000 mL | Freq: Once | OROMUCOSAL | Status: AC
  Administered 2017-02-24: 15 mL via OROMUCOSAL
  Filled 2017-02-24: qty 15

## 2017-02-24 MED ORDER — DIPHENHYDRAMINE HCL 12.5 MG/5ML PO ELIX
25.0000 mg | ORAL_SOLUTION | Freq: Once | ORAL | Status: AC
  Administered 2017-02-24: 25 mg via ORAL
  Filled 2017-02-24: qty 10

## 2017-02-24 MED ORDER — MAGIC MOUTHWASH W/LIDOCAINE: 5.0000 mL | Freq: Four times a day (QID) | ORAL | 0 refills | Status: DC

## 2017-02-24 MED ORDER — AMOXICILLIN 500 MG PO CAPS: 500.0000 mg | ORAL_CAPSULE | Freq: Three times a day (TID) | ORAL | 0 refills | Status: DC

## 2017-02-24 NOTE — ED Notes (Signed)
First Nurse Note:  Patient complaining of sore throat.  Child sick - seen here yesterday. Speech clear.   Sitting in WC, accompanied by Mother.

## 2017-02-24 NOTE — ED Notes (Signed)
Pt presents today with sore throat. Pt states throat started 2 days ago and has progressively gotten worse. Pt states it is worse at night. Pt son was seen yesterday for URI symptoms. Pt states her ear and throat hurt more left than right. Pt is NAD and family at bedside.  Pt has hx of migraines.

## 2017-02-24 NOTE — ED Triage Notes (Signed)
Patient complaining of sore throat, body aches, with chills and dizziness.  Took Tylenol Cold and Headache 2 hrs. ago.  SX started Wed PM with sore throat.  Child is sick too, seen here yesterday.  Alert and oriented, speech unlabored.
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
## 2017-03-31 ENCOUNTER — Other Ambulatory Visit: Payer: Self-pay

## 2017-03-31 ENCOUNTER — Ambulatory Visit: Payer: Self-pay

## 2017-03-31 ENCOUNTER — Encounter (INDEPENDENT_AMBULATORY_CARE_PROVIDER_SITE_OTHER): Payer: Self-pay

## 2017-03-31 ENCOUNTER — Ambulatory Visit: Payer: Self-pay | Admitting: Pharmacy Technician

## 2017-03-31 VITALS — Ht 68.0 in | Wt 194.0 lb

## 2017-03-31 DIAGNOSIS — Z79899 Other long term (current) drug therapy: Secondary | ICD-10-CM

## 2017-03-31 NOTE — Progress Notes (Signed)
Completed [facility] application and contract.  Patient agreed to all terms of the [facility] contract.    Patient approved to receive medication assistance until 05/12/17.  Patient to have insurance at that point.    Provided patient with community resource material based on her particular needs.     Sherilyn DacostaBetty J. Suttyn Rivera Care Manager [facility]
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
## 2017-04-13 ENCOUNTER — Encounter (INDEPENDENT_AMBULATORY_CARE_PROVIDER_SITE_OTHER): Payer: Self-pay

## 2018-02-19 ENCOUNTER — Telehealth: Payer: Self-pay

## 2018-02-21 ENCOUNTER — Encounter: Payer: Self-pay | Admitting: Obstetrics and Gynecology

## 2018-03-01 ENCOUNTER — Encounter: Payer: Self-pay | Admitting: Obstetrics and Gynecology

## 2018-03-20 ENCOUNTER — Encounter: Payer: Self-pay | Admitting: Obstetrics and Gynecology

## 2018-04-02 ENCOUNTER — Encounter: Payer: Self-pay | Admitting: Pharmacist

## 2018-04-02 ENCOUNTER — Other Ambulatory Visit: Payer: Self-pay

## 2018-04-02 ENCOUNTER — Ambulatory Visit: Payer: Self-pay | Admitting: Pharmacist

## 2018-04-02 DIAGNOSIS — Z79899 Other long term (current) drug therapy: Secondary | ICD-10-CM
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
## 2018-04-03 ENCOUNTER — Ambulatory Visit (INDEPENDENT_AMBULATORY_CARE_PROVIDER_SITE_OTHER): Payer: PRIVATE HEALTH INSURANCE | Admitting: Obstetrics and Gynecology

## 2018-04-03 ENCOUNTER — Encounter: Payer: Self-pay | Admitting: Obstetrics and Gynecology

## 2018-04-03 VITALS — BP 115/80 | HR 94 | Ht 67.0 in | Wt 179.9 lb

## 2018-04-03 DIAGNOSIS — K5901 Slow transit constipation: Secondary | ICD-10-CM

## 2018-04-03 DIAGNOSIS — N941 Unspecified dyspareunia: Secondary | ICD-10-CM

## 2018-04-03 DIAGNOSIS — N921 Excessive and frequent menstruation with irregular cycle: Secondary | ICD-10-CM

## 2018-04-03 NOTE — Progress Notes (Signed)
Pt stated that she having spotting during depo and noticed it for a few months. Pt also stated having painful intercourse.

## 2018-04-03 NOTE — Progress Notes (Signed)
GYNECOLOGY PROGRESS NOTE  Subjective:    Patient ID: Sandra Rivera, female    DOB: [birth year], 23 y.o.   MRN: [id number]  HPI  Patient is a 23 y.o. G33P1001 female who presents for complaints of:   1. Painful intercourse - she reports that this has been ongoing on and off for several years, was actually worse prior to giving birth 3 years ago, however calmed down some after child birth. Feels like it is progressively worsening again. The pain feels sometimes sharp, and tends to occur right after intercourse, lasting for several minutes to 2 hours. She reports that she has had negative STD testing recently as she is with a new partner.   2.  Spotting with Depo Provera. She has been on Depo Provera contraception for approximately 3 years. She notes that she has been spotting for the past several months.      The following portions of the patient's history were reviewed and updated as appropriate: allergies, current medications, past family history, past medical history, past social history, past surgical history and problem list.  Review of Systems Pertinent items noted in HPI and remainder of comprehensive ROS otherwise negative.   Objective:   Blood pressure 115/80, pulse 94, height 5\' 7"  (1.702 m), weight 179 lb 14.4 oz (81.6 kg). General appearance: alert and no distress Abdomen: soft, non-tender; bowel sounds normal; no masses,  no organomegaly Pelvic: external genitalia normal, rectovaginal septum normal.  Vagina without discharge, positive for point tenderness along right vaginal sidewall.  Large mound of stool palpable from posterior vagina. Cervix normal appearing, no lesions but positive for motion tenderness.  Uterus mobile, nontender, normal shape and size.  Adnexae non-palpable, nontender bilaterally.  Extremities: extremities normal, atraumatic, no cyanosis or edema Neurologic: Grossly normal   Assessment:   Dyspareunia Breakthrough bleeding on  Depo-Provera Constipation  Plan:   1.  Dyspareunia.  Has been experienced for several years.  On further review of symptoms patient also notes that prior to being on contraception that she did have a history of heavy menses.  Symptoms are suggestive for possible diagnosis of endometriosis.  Discussed diagnosis with patient.  Explained that the only true method of diagnosis is by laparoscopy however, it can often be treated empirically based on symptoms alone.  She is currently already on Depo-Provera which hormonally should suppress her symptoms.  Discussed other options with patient including changing to a different contraceptive method such as an IUD, or can consider a trial of Orilissa.  Discussed risks and benefits of each method.  Patient notes that she is okay to location.  Given 1 month sample.  To follow-up in 1 month to reassess symptoms. 2. Breakthrough bleeding on Depo-Provera.  Patient has been on this medication for approximately 3 years.  Discussed that it may be time to consider changing to a different form of contraception.  Patient currently notes that she would like to continue the medication.  Discussed that we could use estrogen add back therapy either with birth control pills for a month or estradiol tablets for 2 weeks.  As patient has decided to try Dewayne Hatch, she may also notice a decrease in her breakthrough bleeding with the Depo-Provera. 3.  Constipation noted on today's pelvic exam.  Discussed symptoms further with patient, who notes that she does not require straining nor does she have hard stools, but she does note that it can be up to 2 to 3 weeks with bowel movements.  Discussed that she may  have a slow transit constipation.  Would be a solitary symptom or could be a component of a large disease process such as IBS.  Patient notes that she has tried stool softeners, MiraLAX past with no real success.  Given samples of Linzess.  Will reassess at her next visit. - To follow up in  1 month.    Hildred Laser, MD [facility]

## 2018-04-03 NOTE — Patient Instructions (Signed)
Take 1 tablet of Orilissa twice a day.  Take 1 tablet of Linzess every other day to regulate bowels.

## 2018-04-09 ENCOUNTER — Telehealth: Payer: Self-pay | Admitting: Surgical

## 2018-04-09 NOTE — Telephone Encounter (Signed)
0

## 2018-04-09 NOTE — Telephone Encounter (Signed)
Patient called in today due to having heavy bleeding and cramping since starting taking the orlissia. She is having pain during intercourse and her anxiety is worse. I have scheduled her to come in and see you tomorrow.

## 2018-04-10 ENCOUNTER — Ambulatory Visit (INDEPENDENT_AMBULATORY_CARE_PROVIDER_SITE_OTHER): Payer: PRIVATE HEALTH INSURANCE | Admitting: Obstetrics and Gynecology

## 2018-04-10 ENCOUNTER — Encounter: Payer: Self-pay | Admitting: Obstetrics and Gynecology

## 2018-04-10 VITALS — BP 110/78 | HR 111 | Ht 67.0 in | Wt 178.0 lb

## 2018-04-10 DIAGNOSIS — N921 Excessive and frequent menstruation with irregular cycle: Secondary | ICD-10-CM | POA: Diagnosis not present

## 2018-04-10 DIAGNOSIS — R102 Pelvic and perineal pain: Secondary | ICD-10-CM

## 2018-04-10 NOTE — Progress Notes (Signed)
    GYNECOLOGY PROGRESS NOTE  Subjective:    Patient ID: Sandra Rivera, female    DOB: 02-06-1996, 23 y.o.   MRN: [id number]  HPI  Patient is a 23 y.o. G16P1001 female who presents for complaints of worsening bleeding and cramping since beginning the [place] last week. Reports that she stopped the medication 2 days ago.  Notes that she is unsure if it has anything to do with the fact that she is also using Phentermine (has been on this medication for ~ 3 weeks).  Is growing more concerned that she may truly have endometriosis and inquires more into diagnosis.   The following portions of the patient's history were reviewed and updated as appropriate: allergies, current medications, past family history, past medical history, past social history, past surgical history and problem list.  Review of Systems Pertinent items noted in HPI and remainder of comprehensive ROS otherwise negative.   Objective:   Blood pressure 110/78, pulse (!) 111, height 5\' 7"  (1.702 m), weight 178 lb (80.7 kg). General appearance: alert and no distress Abdomen: soft, non-tender; bowel sounds normal; no masses,  no organomegaly Pelvic: deferred  Assessment:   Breakthrough bleeding on Depo Provera Pelvic cramping  Plan:   - Discussion had regarding patient's worsening symptoms. May be secondary to medication interactions, or could be Depo Provera itself.  Discussed with patient that she may need to consider a change in her contraceptive method.  Just received last injection less than 1 month ago.  Can give sample of combined OCPs for now, to take for 1 month to help with breakthrough bleeding.  If this does not help, we can consider laparoscopy at that time.  - Advised on alternating Tylenol ES with Motrin 800 mg every 6-8 hrs.   - Follow up in 1 month to reassess symptoms.    A total of 15 minutes were spent face-to-face with the patient during this encounter and over half of that time dealt with counseling and  coordination of care.  Hildred Laser, MD [facility]

## 2018-04-10 NOTE — Progress Notes (Signed)
Pt stated that she started phentermine 30mg   and orlissa last week and noticed that she has increased in cramps and bleeding since then.

## 2018-04-10 NOTE — Patient Instructions (Signed)
Diagnostic Laparoscopy Diagnostic laparoscopy is a procedure to diagnose diseases in the abdomen. It might be done for a variety of reasons, such as to look for scar tissue, cancer, or a reason for abdomen (abdominal) pain. During the procedure, a thin, flexible tube that has a light and a camera on the end (laparoscope) is inserted through an incision in the abdomen. The image from the camera is shown on a monitor to help your surgeon see inside your body. Tell a health care provider about:  Any allergies you have.  All medicines you are taking, including vitamins, herbs, eye drops, creams, and over-the-counter medicines.  Any problems you or family members have had with anesthetic medicines.  Any blood disorders you have.  Any surgeries you have had.  Any medical conditions you have. What are the risks? Generally, this is a safe procedure. However, problems may occur, including:  Infection.  Bleeding.  Allergic reactions to medicines or dyes.  Damage to abdominal structures or organs, such as the intestines, liver, stomach, or spleen. What happens before the procedure? Medicines  Ask your health care provider about: ? Changing or stopping your regular medicines. This is especially important if you are taking diabetes medicines or blood thinners. ? Taking medicines such as aspirin and ibuprofen. These medicines can thin your blood. Do not take these medicines unless your health care provider tells you to take them. ? Taking over-the-counter medicines, vitamins, herbs, and supplements.  You may be given antibiotic medicine to help prevent infection. Staying hydrated Follow instructions from your health care provider about hydration, which may include:  Up to 2 hours before the procedure - you may continue to drink clear liquids, such as water, clear fruit juice, black coffee, and plain tea. Eating and drinking restrictions Follow instructions from your health care provider  about eating and drinking, which may include:  8 hours before the procedure - stop eating heavy meals or foods such as meat, fried foods, or fatty foods.  6 hours before the procedure - stop eating light meals or foods, such as toast or cereal.  6 hours before the procedure - stop drinking milk or drinks that contain milk.  2 hours before the procedure - stop drinking clear liquids. General instructions  Ask your health care provider how your surgical site will be marked or identified.  You may be asked to shower with a germ-killing soap.  Plan to have someone take you home from the hospital or clinic.  Plan to have a responsible adult care for you for at least 24 hours after you leave the hospital or clinic. This is important. What happens during the procedure?   To lower your risk of infection: ? Your health care team will wash or sanitize their hands. ? Hair may be removed from the surgical area. ? Your skin will be washed with soap.  An IV will be inserted into one of your veins.  You will be given a medicine to make you fall asleep (general anesthetic). You may also be given a medicine to help you relax (sedative).  A breathing tube will be placed down your throat to help you breathe during the procedure.  Your abdomen will be filled with an air-like gas so it expands. This will give the surgeon more room to operate and will make your organs easier to see.  Many small incisions will be made in your abdomen.  A laparoscope and other surgical instruments will be inserted into your abdomen through the   incisions.  A tissue sample may be removed from an organ for examination (biopsy). This will depend on the reason why you are having this procedure.  The laparoscope and other instruments will be removed from your abdomen.  The gas will be released.  Your incisions will be closed with stitches (sutures) and covered with a bandage (dressing).  Your breathing tube will be  removed. The procedure may vary among health care providers and hospitals. What happens after the procedure?   Your blood pressure, heart rate, breathing rate, and blood oxygen level will be monitored until the medicines you were given have worn off.  Do not drive for 24 hours if you were given a sedative during your procedure.  It is up to you to get the results of your procedure. Ask your health care provider, or the department that is doing the procedure, when your results will be ready. Summary  Diagnostic laparoscopy is a way to look for problems in the abdomen using small incisions.  Follow instructions from your health care provider about how to prepare for the procedure.  Plan to have a responsible adult care for you for at least 24 hours after you leave the hospital or clinic. This is important. This information is not intended to replace advice given to you by your health care provider. Make sure you discuss any questions you have with your health care provider. Document Released: 06/06/2000 Document Revised: 01/26/2017 Document Reviewed: 08/24/2016 Elsevier Interactive Patient Education  2019 Elsevier Inc.  

## 2018-04-11 ENCOUNTER — Encounter: Payer: Self-pay | Admitting: Obstetrics and Gynecology

## 2018-05-02 ENCOUNTER — Encounter: Payer: Self-pay | Admitting: Obstetrics and Gynecology

## 2018-05-04 ENCOUNTER — Encounter: Payer: PRIVATE HEALTH INSURANCE | Admitting: Obstetrics and Gynecology

## 2018-05-08 ENCOUNTER — Encounter: Payer: Self-pay | Admitting: Obstetrics and Gynecology

## 2018-05-10 ENCOUNTER — Encounter: Payer: PRIVATE HEALTH INSURANCE | Admitting: Obstetrics and Gynecology

## 2018-05-18 ENCOUNTER — Telehealth: Payer: Self-pay | Admitting: Pharmacy Technician

## 2018-05-18 NOTE — Telephone Encounter (Signed)
Patient has prescription insurance with [employer].  No longer meets [facility]'s eligibility requirements.  Patient notified by letter.  Sherilyn Dacosta Care Manager [facility]

## 2018-05-22 ENCOUNTER — Encounter: Payer: PRIVATE HEALTH INSURANCE | Admitting: Obstetrics and Gynecology

## 2018-05-30 ENCOUNTER — Telehealth: Payer: Self-pay

## 2018-05-30 ENCOUNTER — Encounter: Payer: PRIVATE HEALTH INSURANCE | Admitting: Obstetrics and Gynecology

## 2018-05-30 ENCOUNTER — Telehealth: Payer: Self-pay | Admitting: Obstetrics and Gynecology

## 2018-05-30 NOTE — Telephone Encounter (Signed)
Pt called to speak more about her concerns for calling the office. Pt was advised to use condoms and other OTC protection until her visit with [facility] on Friday.

## 2018-05-30 NOTE — Telephone Encounter (Signed)
Please see another phone encounter.  

## 2018-05-30 NOTE — Telephone Encounter (Signed)
The patient's depo is past due, but she has a refill, and she states that Dr. Valentino Saxon does not want her back on depo, and she is coming Friday to discuss the options and she is asking if she needs to do something before then to be sure she does not get pregnant, and she is asking to speak to the nurse before Friday, please advise, thanks.

## 2018-06-01 ENCOUNTER — Ambulatory Visit (INDEPENDENT_AMBULATORY_CARE_PROVIDER_SITE_OTHER): Payer: PRIVATE HEALTH INSURANCE | Admitting: Obstetrics and Gynecology

## 2018-06-01 ENCOUNTER — Other Ambulatory Visit: Payer: Self-pay

## 2018-06-01 ENCOUNTER — Encounter: Payer: Self-pay | Admitting: Obstetrics and Gynecology

## 2018-06-01 VITALS — BP 154/88 | HR 103 | Ht 67.0 in | Wt 173.7 lb

## 2018-06-01 DIAGNOSIS — Z30017 Encounter for initial prescription of implantable subdermal contraceptive: Secondary | ICD-10-CM | POA: Diagnosis not present

## 2018-06-01 DIAGNOSIS — R102 Pelvic and perineal pain: Secondary | ICD-10-CM

## 2018-06-01 DIAGNOSIS — N921 Excessive and frequent menstruation with irregular cycle: Secondary | ICD-10-CM

## 2018-06-01 NOTE — Patient Instructions (Addendum)
Nexplanon Instructions After Insertion   Keep bandage clean and dry for 24 hours   May use ice/Tylenol/Ibuprofen for soreness or pain   If you develop fever, drainage or increased warmth from incision site-contact office immediately Etonogestrel implant What is this medicine? ETONOGESTREL (et oh noe JES trel) is a contraceptive (birth control) device. It is used to prevent pregnancy. It can be used for up to 3 years. This medicine may be used for other purposes; ask your health care provider or pharmacist if you have questions. COMMON BRAND NAME(S): Implanon, Nexplanon What should I tell my health care provider before I take this medicine? They need to know if you have any of these conditions: -abnormal vaginal bleeding -blood vessel disease or blood clots -breast, cervical, endometrial, ovarian, liver, or uterine cancer -diabetes -gallbladder disease -heart disease or recent heart attack -high blood pressure -high cholesterol or triglycerides -kidney disease -liver disease -migraine headaches -seizures -stroke -tobacco smoker -an unusual or allergic reaction to etonogestrel, anesthetics or antiseptics, other medicines, foods, dyes, or preservatives -pregnant or trying to get pregnant -breast-feeding How should I use this medicine? This device is inserted just under the skin on the inner side of your upper arm by a health care professional. Talk to your pediatrician regarding the use of this medicine in children. Special care may be needed. Overdosage: If you think you have taken too much of this medicine contact a poison control center or emergency room at once. NOTE: This medicine is only for you. Do not share this medicine with others. What if I miss a dose? This does not apply. What may interact with this medicine? Do not take this medicine with any of the following medications: -amprenavir -fosamprenavir This medicine may also interact with the following  medications: -acitretin -aprepitant -armodafinil -bexarotene -bosentan -carbamazepine -certain medicines for fungal infections like fluconazole, ketoconazole, itraconazole and voriconazole -certain medicines to treat hepatitis, HIV or AIDS -cyclosporine -felbamate -griseofulvin -lamotrigine -modafinil -oxcarbazepine -phenobarbital -phenytoin -primidone -rifabutin -rifampin -rifapentine -St. John's wort -topiramate This list may not describe all possible interactions. Give your health care provider a list of all the medicines, herbs, non-prescription drugs, or dietary supplements you use. Also tell them if you smoke, drink alcohol, or use illegal drugs. Some items may interact with your medicine. What should I watch for while using this medicine? This product does not protect you against HIV infection (AIDS) or other sexually transmitted diseases. You should be able to feel the implant by pressing your fingertips over the skin where it was inserted. Contact your doctor if you cannot feel the implant, and use a non-hormonal birth control method (such as condoms) until your doctor confirms that the implant is in place. Contact your doctor if you think that the implant may have broken or become bent while in your arm. You will receive a user card from your health care provider after the implant is inserted. The card is a record of the location of the implant in your upper arm and when it should be removed. Keep this card with your health records. What side effects may I notice from receiving this medicine? Side effects that you should report to your doctor or health care professional as soon as possible: -allergic reactions like skin rash, itching or hives, swelling of the face, lips, or tongue -breast lumps, breast tissue changes, or discharge -breathing problems -changes in emotions or moods -if you feel that the implant may have broken or bent while in your arm -high blood    pressure -pain, irritation, swelling, or bruising at the insertion site -scar at site of insertion -signs of infection at the insertion site such as fever, and skin redness, pain or discharge -signs and symptoms of a blood clot such as breathing problems; changes in vision; chest pain; severe, sudden headache; pain, swelling, warmth in the leg; trouble speaking; sudden numbness or weakness of the face, arm or leg -signs and symptoms of liver injury like dark yellow or brown urine; general ill feeling or flu-like symptoms; light-colored stools; loss of appetite; nausea; right upper belly pain; unusually weak or tired; yellowing of the eyes or skin -unusual vaginal bleeding, discharge Side effects that usually do not require medical attention (report to your doctor or health care professional if they continue or are bothersome): -acne -breast pain or tenderness -headache -irregular menstrual bleeding -nausea This list may not describe all possible side effects. Call your doctor for medical advice about side effects. You may report side effects to FDA at 1-800-FDA-1088. Where should I keep my medicine? This drug is given in a hospital or clinic and will not be stored at home. NOTE: This sheet is a summary. It may not cover all possible information. If you have questions about this medicine, talk to your doctor, pharmacist, or health care provider.  2019 Elsevier/Gold Standard (2017-01-17 14:11:42)   

## 2018-06-01 NOTE — Progress Notes (Signed)
Pt is present today for medication review. Pt stated that she is not taking her depo due to bleeding while on it. Pt stated that she want to discuss changing her birth control. Pt thinks she wants to get the nexplanon or patch.

## 2018-06-01 NOTE — Progress Notes (Signed)
     GYNECOLOGY OFFICE PROCEDURE NOTE  Sandra Rivera is a 23 y.o. G1P1001 here for Nexplanon insertion.  Notes that she is still having irregular bleeding and cramping despite use of sample of OCPs last visit (February).  Desires a Nexplanon today. No other gynecologic concerns.  Nexplanon Insertion Procedure Patient identified, informed consent performed, consent signed.   Patient does understand that irregular bleeding is a very common side effect of this medication. She was advised to have backup contraception for one week after placement. Pregnancy test in clinic today was negative.  Appropriate time out taken.  Patient's right arm was prepped and draped in the usual sterile fashion. The ruler used to measure and mark insertion area.  Patient was prepped with alcohol swab and then injected with 3 ml of 1% lidocaine.  She was prepped with betadine, Nexplanon removed from packaging,  Device confirmed in needle, then inserted full length of needle and withdrawn per handbook instructions. Nexplanon was able to palpated in the patient's arm; patient palpated the insert herself. There was minimal blood loss.  Patient insertion site covered with guaze and a pressure bandage to reduce any bruising.  The patient tolerated the procedure well and was given post procedure instructions.     Exp: 10/17/2020 Lot: [id number]  Hildred Laser, MD [facility]

## 2018-07-12 ENCOUNTER — Telehealth: Payer: Self-pay | Admitting: Obstetrics and Gynecology

## 2018-07-12 NOTE — Telephone Encounter (Signed)
Patient called about having side effects from the Nexplanon. She said that she is having mood swings, anxiety, and depression. She said that she can just be sitting around and will bust out crying. Her mood swings are bad. She is arguing with her mother and her significant other. I have scheduled her a tele visit tomorrow with you.

## 2018-07-12 NOTE — Telephone Encounter (Signed)
The patient called and stated that she is experiencing (Mood swings, anxiety, and depression) with her Nexplanon and is also experiencing severe cramping. The patient is requesting a call back to discuss these concerns with a nurse. Please advise.

## 2018-07-13 ENCOUNTER — Telehealth: Payer: Self-pay

## 2018-07-13 ENCOUNTER — Ambulatory Visit (INDEPENDENT_AMBULATORY_CARE_PROVIDER_SITE_OTHER): Payer: PRIVATE HEALTH INSURANCE | Admitting: Obstetrics and Gynecology

## 2018-07-13 ENCOUNTER — Other Ambulatory Visit: Payer: Self-pay

## 2018-07-13 ENCOUNTER — Encounter: Payer: Self-pay | Admitting: Obstetrics and Gynecology

## 2018-07-13 VITALS — Ht 67.0 in

## 2018-07-13 DIAGNOSIS — F419 Anxiety disorder, unspecified: Secondary | ICD-10-CM | POA: Diagnosis not present

## 2018-07-13 DIAGNOSIS — Z975 Presence of (intrauterine) contraceptive device: Secondary | ICD-10-CM | POA: Diagnosis not present

## 2018-07-13 DIAGNOSIS — F329 Major depressive disorder, single episode, unspecified: Secondary | ICD-10-CM | POA: Diagnosis not present

## 2018-07-13 DIAGNOSIS — K5909 Other constipation: Secondary | ICD-10-CM

## 2018-07-13 MED ORDER — TRAZODONE HCL 50 MG PO TABS: 50.0000 mg | ORAL_TABLET | Freq: Every day | ORAL | 1 refills | Status: DC

## 2018-07-13 MED ORDER — LINACLOTIDE 145 MCG PO CAPS: 145.0000 ug | ORAL_CAPSULE | Freq: Every day | ORAL | 1 refills | Status: DC

## 2018-07-13 MED ORDER — QUETIAPINE FUMARATE 50 MG PO TABS: 50.0000 mg | ORAL_TABLET | Freq: Every day | ORAL | 0 refills | Status: DC

## 2018-07-13 NOTE — Progress Notes (Signed)
Virtual Visit via Telephone Note  I connected with Sandra Deedsamara M Wrinkle on 07/13/18 at  2:00 PM EDT by telephone and verified that I am speaking with the correct person using two identifiers.   I discussed the limitations, risks, security and privacy concerns of performing an evaluation and management service by telephone and the availability of in person appointments. I also discussed with the patient that there may be a patient responsible charge related to this service. The patient expressed understanding and agreed to proceed.   History of Present Illness: Sandra Rivera is a G1P1001 with history of depression and anxiety, also with recent Nexplanon placement last month who complains of stomach cramps (sharp, sometimes worsened with eating), weight gain (due to eating more and craving different foods than what she normally eats), worsening of her depression and anxiety, lack of energy, and just not feeling like herself. She also reports breast tenderness, nausea and fatigue, thinking that she may be pregnant, however has had several negative pregnancy tests.She continues to endorse dyspareunia (however is not as sexually active lately). She does however report improvement in her bleeding with the Nexplanon, as she has had no further issues with a menstrual cycle this month. Of note, patient has a prior history of depression and anxiety, took herself off of her medications in December as she notes that she does not want to be someone who needs to be "controlled with medications". She states that she has been on several different medications and feels that they weren't really helping her.  Also, she continues to note a history of irregular bowel movements, stating that she last went several days ago. Reports being prescribed laxatives and even Miralax in the past but this did not help. Has gone up to 2 weeks without a bowel movement in the past.  She states that she has also had to cut back on her use of NSAIDs  as she was using a bottle of BC powder each week for cramping for several months.   She also is noting several life stressors that may be affecting her moods, including relationship troubles with her boyfriend, and her mother moving back in with her (who also has a history of depression and anxiety). Her PHQ-9 score is 14, and her GAD score is 18.  Denies SI/HI.     Observations/Objective: Height 5\' 7"  (1.702 m). Patient subjectively reports weight gain, but has not weighed herself recently.   Assessment and Plan: 1. Nexplanon in place - patient currently concerned regarding Nexplanon causing or worsening her symptoms regarding her mood, weight, appetite, and low energy level. Discussed that it is possible that the Nexplanon could be causing some of her symptoms, but usually most side effects resolve after approximately 3 months.  Advised to wait until then before deciding on removal as it is helping her menstrual flow.   2. Depression and anxiety - Discussed that her depression and anxiety may be ongoing as patient has not been on her medications in several months, and usually does not take them regularly even when she is on the medication. Discussed resuming her medications, however patient is concerned that it will take too long for them to "kick in", as she also has a h/o panic attacks.  Discussed short term treatment with use of a benzodiazepine (such as Xanax). Patient notes that she tried some of her mother's Xanax before once or twice and it just made her drowsy but did not help her anxiety.  Discussed option of starting  a new medication that would help with her depression symptoms, sleep issues and anxiety until her maintenance medications were at good levels. Will start on Trazadone. Advised that she should also follow up with a counselor or her PCP for continued management.  3. Chronic constipation - discussed that this was likely the cause of her stomach cramps, and that she may be dealing  with irritable bowel syndrome as she continues to have cramping with eating as well.  She has tried laxatives, stool softeners, and Miralax in the past.  Advised to try Linzess for at least 2 weeks (taking daily or every other day) to see if this helps with her stomach cramping.    Follow Up Instructions: If patient's symptoms do not abate in 1 month, will have her follow up in office for visit, and possibly discuss surgery to assess for endometriosis.    I discussed the assessment and treatment plan with the patient. The patient was provided an opportunity to ask questions and all were answered. The patient agreed with the plan and demonstrated an understanding of the instructions.   The patient was advised to call back or seek an in-person evaluation if the symptoms worsen or if the condition fails to improve as anticipated.  I provided 50 minutes of non-face-to-face time during this encounter, and over half of that time dealt with counseling and coordination of care.  The medical decision making for this visit was moderate.   Hildred Laser, MD

## 2018-07-13 NOTE — Telephone Encounter (Signed)
Ok. Thank you.

## 2018-07-13 NOTE — Telephone Encounter (Signed)
Tele visit

## 2018-07-13 NOTE — Progress Notes (Signed)
Telephone visit. Pt routined from the front desk. Pt stated that she has been having stomach cramps feels like contractions, weight gain(but pt has not weighted herself, depression/axinety, no energy, and breast tenderness. Pt stated that she would start crying for no reason and have days that she do not want to be around anyone. GAD-7=18. EPDS=23.

## 2018-07-13 NOTE — Telephone Encounter (Signed)
Pt's Pharmacy sent fax regarding insurance not covering medication. Thank you.

## 2018-07-17 ENCOUNTER — Telehealth: Payer: Self-pay

## 2018-07-17 NOTE — Telephone Encounter (Signed)
Pt was called and informed that her insurance had denied her medication linzess and we had samples in the office for her to use until [facility] is about to find a medication that would be covered by her insurance. Pt stated that she still has samples of the Linzess that [facility] gave her during her last in office visit. Pt was advised to call the office when she get homes and let someone know how many samples she has already at home.

## 2018-07-18 ENCOUNTER — Telehealth: Payer: Self-pay | Admitting: Obstetrics and Gynecology

## 2018-07-18 NOTE — Telephone Encounter (Signed)
The patient called and stated that she needs to speak with a nurse in regards to her medication and samples. Please advise.

## 2018-07-19 NOTE — Telephone Encounter (Signed)
Pt called and stated that she has took the samples of Linzess. Pt was advised to continue taking the medication and try increasing her fiber intake, taking short walks, and try a warm bath. Pt stated that her anxiety is still increased and the trazodone is not working. Pt stated that she wanted to try a low dose of Xanax. Pt is informed that I would have to speak to [facility] before any changes to her medication could be done. Pt stated that she understood, but is wanting a call back today.

## 2018-07-25 ENCOUNTER — Other Ambulatory Visit: Payer: Self-pay | Admitting: Obstetrics and Gynecology

## 2018-07-25 DIAGNOSIS — F32A Depression, unspecified: Secondary | ICD-10-CM

## 2018-07-25 DIAGNOSIS — F410 Panic disorder [episodic paroxysmal anxiety] without agoraphobia: Secondary | ICD-10-CM

## 2018-07-25 DIAGNOSIS — F329 Major depressive disorder, single episode, unspecified: Secondary | ICD-10-CM

## 2018-07-25 MED ORDER — ALPRAZOLAM 0.25 MG PO TABS: 0.2500 mg | ORAL_TABLET | Freq: Two times a day (BID) | ORAL | 0 refills | Status: DC | PRN

## 2018-08-13 ENCOUNTER — Ambulatory Visit: Payer: PRIVATE HEALTH INSURANCE | Admitting: Licensed Clinical Social Worker

## 2018-08-13 ENCOUNTER — Encounter

## 2018-08-14 ENCOUNTER — Telehealth: Payer: Self-pay

## 2018-08-14 NOTE — Telephone Encounter (Signed)
Coronavirus (COVID-19) Are you at risk?  Are you at risk for the Coronavirus (COVID-19)?  To be considered HIGH RISK for Coronavirus (COVID-19), you have to meet the following criteria:  . Traveled to [place], [place], [place], [place] or [place]; or in the [place] to [place], [place], [place], or [place]; and have fever, cough, and shortness of breath within the last 2 weeks of travel OR . Been in close contact with a person diagnosed with COVID-19 within the last 2 weeks and have fever, cough, and shortness of breath . IF YOU DO NOT MEET THESE CRITERIA, YOU ARE CONSIDERED LOW RISK FOR COVID-19.  What to do if you are HIGH RISK for COVID-19?  . If you are having a medical emergency, call 911. . Seek medical care right away. Before you go to a doctor's office, urgent care or emergency department, call ahead and tell them about your recent travel, contact with someone diagnosed with COVID-19, and your symptoms. You should receive instructions from your physician's office regarding next steps of care.  . When you arrive at healthcare provider, tell the healthcare staff immediately you have returned from visiting [place], [place], [place], [place] or [place]; or traveled in the [place] to [place], [place], [place], or [place]; in the last two weeks or you have been in close contact with a person diagnosed with COVID-19 in the last 2 weeks.   . Tell the health care staff about your symptoms: fever, cough and shortness of breath. . After you have been seen by a medical provider, you will be either: o Tested for (COVID-19) and discharged home on quarantine except to seek medical care if symptoms worsen, and asked to  - Stay home and avoid contact with others until you get your results (4-5 days)  - Avoid travel on public transportation if possible (such as bus, train, or airplane) or o Sent to the Emergency Department by EMS for evaluation, COVID-19 testing, and possible  admission depending on your condition and test results.  What to do if you are LOW RISK for COVID-19?  Reduce your risk of any infection by using the same precautions used for avoiding the common cold or flu:  . Wash your hands often with soap and warm water for at least 20 seconds.  If soap and water are not readily available, use an alcohol-based hand sanitizer with at least 60% alcohol.  . If coughing or sneezing, cover your mouth and nose by coughing or sneezing into the elbow areas of your shirt or coat, into a tissue or into your sleeve (not your hands). . Avoid shaking hands with others and consider head nods or verbal greetings only. . Avoid touching your eyes, nose, or mouth with unwashed hands.  . Avoid close contact with people who are sick. . Avoid places or events with large numbers of people in one location, like concerts or sporting events. . Carefully consider travel plans you have or are making. . If you are planning any travel outside or inside the [place], visit the CDC's Travelers' Health webpage for the latest health notices. . If you have some symptoms but not all symptoms, continue to monitor at home and seek medical attention if your symptoms worsen. . If you are having a medical emergency, call 911.   ADDITIONAL HEALTHCARE OPTIONS FOR PATIENTS  [place] [facility] / e-Visit: [email or web address]         [facility]: [phone]  [place]   [facility]: [phone]                   [facility]: [phone]   Pre-screen negative, DM.   

## 2018-08-15 ENCOUNTER — Other Ambulatory Visit: Payer: Self-pay

## 2018-08-15 ENCOUNTER — Other Ambulatory Visit: Payer: Self-pay | Admitting: Obstetrics and Gynecology

## 2018-08-15 ENCOUNTER — Ambulatory Visit (INDEPENDENT_AMBULATORY_CARE_PROVIDER_SITE_OTHER): Payer: PRIVATE HEALTH INSURANCE | Admitting: Obstetrics and Gynecology

## 2018-08-15 ENCOUNTER — Encounter: Payer: Self-pay | Admitting: Obstetrics and Gynecology

## 2018-08-15 VITALS — BP 132/90 | HR 107 | Ht 67.0 in | Wt 177.4 lb

## 2018-08-15 DIAGNOSIS — N921 Excessive and frequent menstruation with irregular cycle: Secondary | ICD-10-CM | POA: Diagnosis not present

## 2018-08-15 DIAGNOSIS — R102 Pelvic and perineal pain: Secondary | ICD-10-CM

## 2018-08-15 DIAGNOSIS — N946 Dysmenorrhea, unspecified: Secondary | ICD-10-CM

## 2018-08-15 DIAGNOSIS — F419 Anxiety disorder, unspecified: Secondary | ICD-10-CM | POA: Diagnosis not present

## 2018-08-15 DIAGNOSIS — Z975 Presence of (intrauterine) contraceptive device: Secondary | ICD-10-CM

## 2018-08-15 DIAGNOSIS — F329 Major depressive disorder, single episode, unspecified: Secondary | ICD-10-CM

## 2018-08-15 DIAGNOSIS — N941 Unspecified dyspareunia: Secondary | ICD-10-CM | POA: Diagnosis not present

## 2018-08-15 DIAGNOSIS — N926 Irregular menstruation, unspecified: Secondary | ICD-10-CM

## 2018-08-15 NOTE — Progress Notes (Signed)
GYNECOLOGY PROGRESS NOTE  Subjective:    Patient ID: Sandra Rivera, female    DOB: [birth year], 23 y.o.   MRN: [id number]  HPI  Patient is a 23 y.o. G58P1001 female who presents for follow up of depression and anxiety symptoms, as well as discuss surgery for pelvic pain, dysmenorrhea, dyspareunia, and abnormal menstrual cycles.     1. Patient reports that her moods have been "out of control".  She has resumed taking her depression medication starting last week that was previously prescribed by her PCP (however cannot recall the name).  Reports that she stopped the Vistaril as it made her feel nauseated. She thinks that she is taking the Buspar but is not sure. Cannot recall if she is taking the Trazadone but thinks she stopped it because she didn't feel it was working fast enough. Is taking newly prescribed Xanax which works, but notes that it doesn't last as long. Is only taking twice daily. GAD score is 21, PHQ-9 score is 19.   2. Patient notes that she is ready to proceed with surgical evaluation for endometriosis as her symptoms are getting worse.   She states that she She currently has a Nexplanon in place for contraception, placed 3 months ago, switched from Depo Provera due to persistent breakthrough bleeding. She notes  The following portions of the patient's history were reviewed and updated as appropriate: allergies, current medications, past family history, past medical history, past social history, past surgical history and problem list.  Review of Systems Pertinent items noted in HPI and remainder of comprehensive ROS otherwise negative.   Objective:   Blood pressure 132/90, pulse (!) 107, height 5\' 7"  (1.702 m), weight 177 lb 6.4 oz (80.5 kg). General appearance: alert and no distress Abdomen: soft, non-tender; bowel sounds normal; no masses,  no organomegaly Pelvic: deferred.  Remainder of exam noted in H&P   Assessment:   Depression and anxiety Pelvic pain  Dysmenorrhea Dyspareunia Abnormal menstrual cycles Nexplanon in place  Plan:   1. Discussion had with patient regarding her depression and anxiety. Strongly encouraged her to follow up with her PCP or can refer to a Psychiatrist for further management of her symptoms.  Patient is also unsure of which medications she takes as she she has started and stopped several over the past several months. Need to get clarification on current medication use. Discussed that she can increase her Xanax to up to 3 times daily, and will increase dose to 0.5 mg. Also given information to [employer] in case of immediate need for walk-in clinic. Also discussed possibility of the Nexplanon worsening her mood symptoms as there is a correlation between the insertion of the Nexplanon and her symptoms.  Patient notes she would like to keep the Nexplanon in place for 6 months before making a decision to remove it.  2.  Patient desires surgical assessment for endometriosis.  She has been on Depo Provera in the past and tried a brief stint of OCPs but has had breakthrough bleeding, tried Dewayne Hatch (but discontinued due to side effects). Currently on Nexplanon. The risks of surgery were discussed in detail with the patient including but not limited to: bleeding which may require transfusion or reoperation; infection which may require prolonged hospitalization or re-hospitalization and antibiotic therapy; injury to bowel, bladder, ureters and major vessels or other surrounding organs; need for additional procedures including laparotomy; thromboembolic phenomenon, incisional problems and other postoperative or anesthesia complications. The postoperative expectations were also discussed in  detail. The patient also understands the alternative treatment options which were discussed in full. All questions were answered.  She was told that she will be contacted by our surgical scheduler regarding the time and date of her surgery;  routine preoperative instructions of having nothing to eat or drink after midnight on the day prior to surgery and also coming to the hospital 1.5 hours prior to her time of surgery were also emphasized.  She was told she may be called for a preoperative appointment about a week prior to surgery and will be given further preoperative instructions at that visit. Printed patient education handouts about the procedure were given to the patient to review at home.  Surgery to be scheduled for 09/10/2018.   A total of 25 minutes were spent face-to-face with the patient during this encounter and over half of that time dealt with counseling and coordination of care.  Hildred Laserherry, Engelbert Sevin, MD [facility]

## 2018-08-15 NOTE — Progress Notes (Signed)
Pt is present today for a follow up for depression and to discuss surgery. Pt stated that she was still having issues with being depressed.   GAD=7=21. PHQ-9=19.

## 2018-08-15 NOTE — Patient Instructions (Signed)
Diagnostic Laparoscopy Diagnostic laparoscopy is a procedure to diagnose diseases in the abdomen. It might be done for a variety of reasons, such as to look for scar tissue, cancer, or a reason for abdomen (abdominal) pain. During the procedure, a thin, flexible tube that has a light and a camera on the end (laparoscope) is inserted through an incision in the abdomen. The image from the camera is shown on a monitor to help your surgeon see inside your body. Tell a health care provider about:  Any allergies you have.  All medicines you are taking, including vitamins, herbs, eye drops, creams, and over-the-counter medicines.  Any problems you or family members have had with anesthetic medicines.  Any blood disorders you have.  Any surgeries you have had.  Any medical conditions you have. What are the risks? Generally, this is a safe procedure. However, problems may occur, including:  Infection.  Bleeding.  Allergic reactions to medicines or dyes.  Damage to abdominal structures or organs, such as the intestines, liver, stomach, or spleen. What happens before the procedure? Medicines  Ask your health care provider about: ? Changing or stopping your regular medicines. This is especially important if you are taking diabetes medicines or blood thinners. ? Taking medicines such as aspirin and ibuprofen. These medicines can thin your blood. Do not take these medicines unless your health care provider tells you to take them. ? Taking over-the-counter medicines, vitamins, herbs, and supplements.  You may be given antibiotic medicine to help prevent infection. Staying hydrated Follow instructions from your health care provider about hydration, which may include:  Up to 2 hours before the procedure - you may continue to drink clear liquids, such as water, clear fruit juice, black coffee, and plain tea. Eating and drinking restrictions Follow instructions from your health care provider  about eating and drinking, which may include:  8 hours before the procedure - stop eating heavy meals or foods such as meat, fried foods, or fatty foods.  6 hours before the procedure - stop eating light meals or foods, such as toast or cereal.  6 hours before the procedure - stop drinking milk or drinks that contain milk.  2 hours before the procedure - stop drinking clear liquids. General instructions  Ask your health care provider how your surgical site will be marked or identified.  You may be asked to shower with a germ-killing soap.  Plan to have someone take you home from the hospital or clinic.  Plan to have a responsible adult care for you for at least 24 hours after you leave the hospital or clinic. This is important. What happens during the procedure?   To lower your risk of infection: ? Your health care team will wash or sanitize their hands. ? Hair may be removed from the surgical area. ? Your skin will be washed with soap.  An IV will be inserted into one of your veins.  You will be given a medicine to make you fall asleep (general anesthetic). You may also be given a medicine to help you relax (sedative).  A breathing tube will be placed down your throat to help you breathe during the procedure.  Your abdomen will be filled with an air-like gas so it expands. This will give the surgeon more room to operate and will make your organs easier to see.  Many small incisions will be made in your abdomen.  A laparoscope and other surgical instruments will be inserted into your abdomen through the   incisions.  A tissue sample may be removed from an organ for examination (biopsy). This will depend on the reason why you are having this procedure.  The laparoscope and other instruments will be removed from your abdomen.  The gas will be released.  Your incisions will be closed with stitches (sutures) and covered with a bandage (dressing).  Your breathing tube will be  removed. The procedure may vary among health care providers and hospitals. What happens after the procedure?   Your blood pressure, heart rate, breathing rate, and blood oxygen level will be monitored until the medicines you were given have worn off.  Do not drive for 24 hours if you were given a sedative during your procedure.  It is up to you to get the results of your procedure. Ask your health care provider, or the department that is doing the procedure, when your results will be ready. Summary  Diagnostic laparoscopy is a way to look for problems in the abdomen using small incisions.  Follow instructions from your health care provider about how to prepare for the procedure.  Plan to have a responsible adult care for you for at least 24 hours after you leave the hospital or clinic. This is important. This information is not intended to replace advice given to you by your health care provider. Make sure you discuss any questions you have with your health care provider. Document Released: 06/06/2000 Document Revised: 01/26/2017 Document Reviewed: 08/24/2016 Elsevier Interactive Patient Education  2019 Elsevier Inc.  

## 2018-08-16 ENCOUNTER — Encounter: Payer: Self-pay | Admitting: Obstetrics and Gynecology
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
## 2018-08-16 NOTE — Telephone Encounter (Signed)
Please have patient contact office to review her medications (she needs to have all pill bottles of active prescriptions).  She was very confused about which medications she was actually taking and which ones she was not, prior to me refilling anything. You can send me a text once it's done and then I can go in to her chart and manage her prescriptions.

## 2018-08-17 NOTE — Telephone Encounter (Signed)
Spoke with patient and she is going to call back Monday with what she is taking. She said that her mom is the one that keeps up with her medications.

## 2018-08-17 NOTE — Telephone Encounter (Signed)
Tried to call patient to get prescriptions. Mailbox was full.

## 2018-08-28 ENCOUNTER — Telehealth: Payer: Self-pay | Admitting: Obstetrics and Gynecology

## 2018-08-28 NOTE — Telephone Encounter (Signed)
The patient called and stated that she needs to speak with her nurse or Dr. Marcelline Mates in regards to some concerns she has with being on multiple medications, The patient stated that she needs her anxiety medication. Was unable to determine what the pt needed due to all of the information given w/ pt being frustrated from the last message sent back. Please advise.

## 2018-08-29 NOTE — Telephone Encounter (Signed)
Pt called to speak with pt more about what type of medication she is currently taking. Pt stated that she is taking all of her medication expect for Imitrex now she takes an injection for her migraines pt is unsure of the name of the medication. Pt stated that her pharmacy was faxing over information of the medication she has token in the last 6 months. Pt was informed that I have not received anything from the pharmacy and would contact them for the information.

## 2018-09-03 ENCOUNTER — Other Ambulatory Visit: Payer: Self-pay

## 2018-09-03 ENCOUNTER — Emergency Department: Payer: PRIVATE HEALTH INSURANCE

## 2018-09-03 ENCOUNTER — Emergency Department
Admission: EM | Admit: 2018-09-03 | Discharge: 2018-09-04 | Disposition: A | Discharge status: Home / Self Care | Payer: PRIVATE HEALTH INSURANCE | Attending: Emergency Medicine | Admitting: Emergency Medicine

## 2018-09-03 ENCOUNTER — Encounter: Payer: Self-pay | Admitting: *Deleted

## 2018-09-03 DIAGNOSIS — R4182 Altered mental status, unspecified: Secondary | ICD-10-CM | POA: Diagnosis not present

## 2018-09-03 DIAGNOSIS — R109 Unspecified abdominal pain: Secondary | ICD-10-CM | POA: Diagnosis not present

## 2018-09-03 DIAGNOSIS — Y92410 Unspecified street and highway as the place of occurrence of the external cause: Secondary | ICD-10-CM | POA: Diagnosis not present

## 2018-09-03 DIAGNOSIS — F17210 Nicotine dependence, cigarettes, uncomplicated: Secondary | ICD-10-CM | POA: Insufficient documentation

## 2018-09-03 DIAGNOSIS — S8991XA Unspecified injury of right lower leg, initial encounter: Secondary | ICD-10-CM | POA: Diagnosis present

## 2018-09-03 DIAGNOSIS — Y998 Other external cause status: Secondary | ICD-10-CM | POA: Diagnosis not present

## 2018-09-03 DIAGNOSIS — S8011XA Contusion of right lower leg, initial encounter: Secondary | ICD-10-CM | POA: Insufficient documentation

## 2018-09-03 DIAGNOSIS — Y9389 Activity, other specified: Secondary | ICD-10-CM | POA: Diagnosis not present

## 2018-09-03 LAB — URINALYSIS, COMPLETE (UACMP) WITH MICROSCOPIC
Bilirubin Urine: NEGATIVE
Glucose, UA: NEGATIVE mg/dL
Hgb urine dipstick: NEGATIVE
Ketones, ur: NEGATIVE mg/dL
Leukocytes,Ua: NEGATIVE
Nitrite: NEGATIVE
Protein, ur: NEGATIVE mg/dL
Specific Gravity, Urine: 1.011 (ref 1.005–1.030)
Squamous Epithelial / HPF: NONE SEEN (ref 0–5)
pH: 6 (ref 5.0–8.0)

## 2018-09-03 LAB — COMPREHENSIVE METABOLIC PANEL
ALT: 20 U/L (ref 0–44)
AST: 22 U/L (ref 15–41)
Albumin: 4.7 g/dL (ref 3.5–5.0)
Alkaline Phosphatase: 54 U/L (ref 38–126)
Anion gap: 14 (ref 5–15)
BUN: 12 mg/dL (ref 6–20)
CO2: 22 mmol/L (ref 22–32)
Calcium: 8.9 mg/dL (ref 8.9–10.3)
Chloride: 102 mmol/L (ref 98–111)
Creatinine, Ser: 0.85 mg/dL (ref 0.44–1.00)
GFR calc Af Amer: >60 mL/min (ref >60)
GFR calc non Af Amer: >60 mL/min (ref >60)
Glucose, Bld: 125 mg/dL — ABNORMAL HIGH (ref 70–99)
Potassium: 3.2 mmol/L — ABNORMAL LOW (ref 3.5–5.1)
Sodium: 138 mmol/L (ref 135–145)
Total Bilirubin: 0.6 mg/dL (ref 0.3–1.2)
Total Protein: 7.2 g/dL (ref 6.5–8.1)

## 2018-09-03 LAB — URINE DRUG SCREEN, QUALITATIVE (ARMC ONLY)
Amphetamines, Ur Screen: NOT DETECTED
Barbiturates, Ur Screen: POSITIVE — AB
Benzodiazepine, Ur Scrn: NOT DETECTED
Cannabinoid 50 Ng, Ur ~~LOC~~: NOT DETECTED
Cocaine Metabolite,Ur ~~LOC~~: NOT DETECTED
MDMA (Ecstasy)Ur Screen: NOT DETECTED
Methadone Scn, Ur: NOT DETECTED
Opiate, Ur Screen: NOT DETECTED
Phencyclidine (PCP) Ur S: NOT DETECTED
Tricyclic, Ur Screen: NOT DETECTED

## 2018-09-03 LAB — CBC WITH DIFFERENTIAL/PLATELET
Abs Immature Granulocytes: 0.04 10*3/uL (ref 0.00–0.07)
Basophils Absolute: 0.1 10*3/uL (ref 0.0–0.1)
Basophils Relative: 0 %
Eosinophils Absolute: 0.1 10*3/uL (ref 0.0–0.5)
Eosinophils Relative: 1 %
HCT: 43.5 % (ref 36.0–46.0)
Hemoglobin: 14.6 g/dL (ref 12.0–15.0)
Immature Granulocytes: 0 %
Lymphocytes Relative: 12 %
Lymphs Abs: 1.9 10*3/uL (ref 0.7–4.0)
MCH: 28.5 pg (ref 26.0–34.0)
MCHC: 33.6 g/dL (ref 30.0–36.0)
MCV: 85 fL (ref 80.0–100.0)
Monocytes Absolute: 0.9 10*3/uL (ref 0.1–1.0)
Monocytes Relative: 5 %
Neutro Abs: 12.8 10*3/uL — ABNORMAL HIGH (ref 1.7–7.7)
Neutrophils Relative %: 82 %
Platelets: 230 10*3/uL (ref 150–400)
RBC: 5.12 MIL/uL — ABNORMAL HIGH (ref 3.87–5.11)
RDW: 12.8 % (ref 11.5–15.5)
WBC: 15.8 10*3/uL — ABNORMAL HIGH (ref 4.0–10.5)
nRBC: 0 % (ref 0.0–0.2)

## 2018-09-03 LAB — POCT PREGNANCY, URINE: Preg Test, Ur: NEGATIVE

## 2018-09-03 LAB — ETHANOL: Alcohol, Ethyl (B): <10 mg/dL (ref <10)

## 2018-09-03 MED ORDER — IOHEXOL 300 MG/ML  SOLN
100.0000 mL | Freq: Once | INTRAMUSCULAR | Status: AC | PRN
  Administered 2018-09-03: 100 mL via INTRAVENOUS
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
## 2018-09-04 MED ORDER — NAPROXEN 500 MG PO TABS
500.0000 mg | ORAL_TABLET | Freq: Once | ORAL | Status: AC
  Administered 2018-09-04: 500 mg via ORAL
  Filled 2018-09-04: qty 1

## 2018-09-04 MED ORDER — IBUPROFEN 100 MG/5ML PO SUSP: 10.0000 mg/kg | Freq: Once | ORAL | Status: DC
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
## 2018-09-05 ENCOUNTER — Telehealth: Payer: Self-pay | Admitting: Obstetrics and Gynecology
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
## 2018-09-06 ENCOUNTER — Emergency Department: Payer: PRIVATE HEALTH INSURANCE

## 2018-09-06 ENCOUNTER — Telehealth: Payer: Self-pay

## 2018-09-06 ENCOUNTER — Encounter: Payer: Self-pay | Admitting: Emergency Medicine

## 2018-09-06 ENCOUNTER — Encounter
Admission: RE | Admit: 2018-09-06 | Discharge: 2018-09-06 | Disposition: A | Discharge status: Home / Self Care | Payer: PRIVATE HEALTH INSURANCE | Source: Ambulatory Visit | Attending: Obstetrics and Gynecology | Admitting: Obstetrics and Gynecology

## 2018-09-06 ENCOUNTER — Other Ambulatory Visit: Payer: Self-pay

## 2018-09-06 ENCOUNTER — Emergency Department
Admission: EM | Admit: 2018-09-06 | Discharge: 2018-09-06 | Disposition: A | Discharge status: Home / Self Care | Payer: PRIVATE HEALTH INSURANCE | Attending: Emergency Medicine | Admitting: Emergency Medicine

## 2018-09-06 DIAGNOSIS — F17210 Nicotine dependence, cigarettes, uncomplicated: Secondary | ICD-10-CM | POA: Diagnosis not present

## 2018-09-06 DIAGNOSIS — Y9389 Activity, other specified: Secondary | ICD-10-CM | POA: Diagnosis not present

## 2018-09-06 DIAGNOSIS — Y929 Unspecified place or not applicable: Secondary | ICD-10-CM | POA: Diagnosis not present

## 2018-09-06 DIAGNOSIS — Z1159 Encounter for screening for other viral diseases: Secondary | ICD-10-CM | POA: Insufficient documentation

## 2018-09-06 DIAGNOSIS — Z79899 Other long term (current) drug therapy: Secondary | ICD-10-CM | POA: Diagnosis not present

## 2018-09-06 DIAGNOSIS — M79604 Pain in right leg: Secondary | ICD-10-CM | POA: Diagnosis not present

## 2018-09-06 DIAGNOSIS — S8011XA Contusion of right lower leg, initial encounter: Secondary | ICD-10-CM | POA: Insufficient documentation

## 2018-09-06 DIAGNOSIS — Y999 Unspecified external cause status: Secondary | ICD-10-CM | POA: Diagnosis not present

## 2018-09-06 DIAGNOSIS — S8991XA Unspecified injury of right lower leg, initial encounter: Secondary | ICD-10-CM | POA: Diagnosis present

## 2018-09-06 HISTORY — DX: Cardiac arrhythmia, unspecified: I49.9

## 2018-09-06 LAB — CBC
HCT: 41.6 % (ref 36.0–46.0)
Hemoglobin: 13.7 g/dL (ref 12.0–15.0)
MCH: 28.2 pg (ref 26.0–34.0)
MCHC: 32.9 g/dL (ref 30.0–36.0)
MCV: 85.8 fL (ref 80.0–100.0)
Platelets: 208 10*3/uL (ref 150–400)
RBC: 4.85 MIL/uL (ref 3.87–5.11)
RDW: 12.9 % (ref 11.5–15.5)
WBC: 5.7 10*3/uL (ref 4.0–10.5)
nRBC: 0 % (ref 0.0–0.2)

## 2018-09-06 MED ORDER — ACETAMINOPHEN 325 MG PO TABS
650.0000 mg | ORAL_TABLET | Freq: Once | ORAL | Status: AC
  Administered 2018-09-06: 650 mg via ORAL
  Filled 2018-09-06: qty 2

## 2018-09-06 MED ORDER — METHOCARBAMOL 500 MG PO TABS: 500.0000 mg | ORAL_TABLET | Freq: Four times a day (QID) | ORAL | 0 refills | Status: DC

## 2018-09-06 MED ORDER — MELOXICAM 15 MG PO TABS: 15.0000 mg | ORAL_TABLET | Freq: Every day | ORAL | 0 refills | Status: DC
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
## 2018-09-06 NOTE — ED Notes (Signed)
Patient AAOx4. Vitals Stable. NAD. 

## 2018-09-06 NOTE — Patient Instructions (Signed)
Your procedure is scheduled on:09/10/18 Report to Day Surgery. [facility] SECOND FLOOR To find out your arrival time please call [phone] between 1PM - 3PM on 09/07/18.  Remember: Instructions that are not followed completely may result in serious medical risk,  up to and including death, or upon the discretion of your surgeon and anesthesiologist your  surgery may need to be rescheduled.     _X__ 1. Do not eat food after midnight the night before your procedure.                 No gum chewing or hard candies. You may drink clear liquids up to 2 hours                 before you are scheduled to arrive for your surgery- DO not drink clear                 liquids within 2 hours of the start of your surgery.                 Clear Liquids include:  water, apple juice without pulp, clear carbohydrate                 drink such as Clearfast of Gatorade, Black Coffee or Tea (Do not add                 anything to coffee or tea). FINISH CARB DRINK 2 FULL HOURS BEFORE ARRIVING AT HOSPITAL MORNING OF SURGERY  __X__2.  On the morning of surgery brush your teeth with toothpaste and water, you                may rinse your mouth with mouthwash if you wish.  Do not swallow any toothpaste of mouthwash.     _X__ 3.  No Alcohol for 24 hours before or after surgery.   _X__ 4.  Do Not Smoke or use e-cigarettes For 24 Hours Prior to Your Surgery.                 Do not use any chewable tobacco products for at least 6 hours prior to                 surgery.  ____  5.  Bring all medications with you on the day of surgery if instructed.   ____  6.  Notify your doctor if there is any change in your medical condition      (cold, fever, infections).     Do not wear jewelry, make-up, hairpins, clips or nail polish. Do not wear lotions, powders, or perfumes. You may wear deodorant. Do not shave 48 hours prior to surgery. Men may shave face and neck. Do not bring valuables to the  hospital.    [facility] any belongings or valuables.  Contacts, dentures or bridgework may not be worn into surgery. Leave your suitcase in the car. After surgery it may be brought to your room. For patients admitted to the hospital, discharge time is determined by your treatment team.   Patients discharged the day of surgery will not be allowed to drive home.   Please read over the following fact sheets that you were given:   Surgical Site Infection Prevention          __X_ Take these medicines the morning of surgery with A SIP OF WATER:    1. BUSPAR  2. ALPRZAZOLAM  3. CITALOPRAM  4.  5.  6.  ____ Fleet Enema (as directed)   __X__ Use CHG Soap as directed  ____ Use inhalers on the day of surgery  ____ Stop metformin 2 days prior to surgery    ____ Take 1/2 of usual insulin dose the night before surgery. No insulin the morning          of surgery.   ____ Stop Coumadin/Plavix/aspirin on   ____ Stop Anti-inflammatories on    _X___ Stop supplements until after surgery.    STOP PHENTERMINE UNTIL AFTER SURGERY  ____ Bring C-Pap to the hospital.

## 2018-09-06 NOTE — ED Notes (Signed)
US at the bedside
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
## 2018-09-06 NOTE — ED Notes (Signed)
Pt is out of room  Asking about the wait  Explained the wait  Encouraged to wait in room

## 2018-09-06 NOTE — Telephone Encounter (Signed)
Please see another phone encounter.  
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
## 2018-09-07 ENCOUNTER — Telehealth: Payer: Self-pay

## 2018-09-07 LAB — NOVEL CORONAVIRUS, NAA (HOSP ORDER, SEND-OUT TO REF LAB; TAT 18-24 HRS): SARS-CoV-2, NAA: NOT DETECTED
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
## 2018-09-07 NOTE — Pre-Procedure Instructions (Signed)
Called crystal at [facility] and let her know that we still do not have a clearance-Crystal to call pt and see what is going on with clearance

## 2018-09-10 ENCOUNTER — Ambulatory Visit
Admission: RE | Admit: 2018-09-10 | Payer: PRIVATE HEALTH INSURANCE | Source: Home / Self Care | Admitting: Obstetrics and Gynecology

## 2018-09-10 SURGERY — LAPAROSCOPY OPERATIVE
Anesthesia: General

## 2018-10-29 ENCOUNTER — Emergency Department
Admission: EM | Admit: 2018-10-29 | Discharge: 2018-10-29 | Disposition: A | Discharge status: Home / Self Care | Payer: PRIVATE HEALTH INSURANCE | Attending: Student in an Organized Health Care Education/Training Program | Admitting: Student in an Organized Health Care Education/Training Program

## 2018-10-29 ENCOUNTER — Encounter: Payer: Self-pay | Admitting: Emergency Medicine

## 2018-10-29 ENCOUNTER — Emergency Department: Payer: PRIVATE HEALTH INSURANCE

## 2018-10-29 ENCOUNTER — Other Ambulatory Visit: Payer: Self-pay

## 2018-10-29 DIAGNOSIS — F17210 Nicotine dependence, cigarettes, uncomplicated: Secondary | ICD-10-CM | POA: Diagnosis not present

## 2018-10-29 DIAGNOSIS — S3992XA Unspecified injury of lower back, initial encounter: Secondary | ICD-10-CM | POA: Diagnosis present

## 2018-10-29 DIAGNOSIS — Y929 Unspecified place or not applicable: Secondary | ICD-10-CM | POA: Diagnosis not present

## 2018-10-29 DIAGNOSIS — Y999 Unspecified external cause status: Secondary | ICD-10-CM | POA: Diagnosis not present

## 2018-10-29 DIAGNOSIS — M5410 Radiculopathy, site unspecified: Secondary | ICD-10-CM

## 2018-10-29 DIAGNOSIS — Z79899 Other long term (current) drug therapy: Secondary | ICD-10-CM | POA: Diagnosis not present

## 2018-10-29 DIAGNOSIS — X509XXA Other and unspecified overexertion or strenuous movements or postures, initial encounter: Secondary | ICD-10-CM | POA: Insufficient documentation

## 2018-10-29 DIAGNOSIS — S39012A Strain of muscle, fascia and tendon of lower back, initial encounter: Secondary | ICD-10-CM

## 2018-10-29 DIAGNOSIS — Y9389 Activity, other specified: Secondary | ICD-10-CM | POA: Insufficient documentation

## 2018-10-29 MED ORDER — LIDOCAINE 5 % EX PTCH: 1.0000 | MEDICATED_PATCH | Freq: Two times a day (BID) | CUTANEOUS | 0 refills | Status: DC

## 2018-10-29 MED ORDER — PREDNISONE 20 MG PO TABS: 20.0000 mg | ORAL_TABLET | Freq: Two times a day (BID) | ORAL | 0 refills | Status: AC

## 2018-10-29 MED ORDER — CYCLOBENZAPRINE HCL 5 MG PO TABS: 5.0000 mg | ORAL_TABLET | Freq: Three times a day (TID) | ORAL | 0 refills | Status: DC | PRN

## 2018-10-29 MED ORDER — HYDROCODONE-ACETAMINOPHEN 5-325 MG PO TABS
1.0000 | ORAL_TABLET | Freq: Once | ORAL | Status: AC
  Administered 2018-10-29: 1 via ORAL
  Filled 2018-10-29: qty 1

## 2018-10-29 MED ORDER — ORPHENADRINE CITRATE 30 MG/ML IJ SOLN
60.0000 mg | INTRAMUSCULAR | Status: AC
  Administered 2018-10-29: 16:00:00 60 mg via INTRAMUSCULAR
  Filled 2018-10-29: qty 2

## 2018-10-29 MED ORDER — KETOROLAC TROMETHAMINE 30 MG/ML IJ SOLN
30.0000 mg | Freq: Once | INTRAMUSCULAR | Status: AC
  Administered 2018-10-29: 30 mg via INTRAMUSCULAR
  Filled 2018-10-29: qty 1
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
## 2018-10-29 NOTE — ED Triage Notes (Signed)
States she developed lower back pain today after bending this am  States pain is going into right leg  But today states pain is moving under abd into left leg

## 2018-11-21 ENCOUNTER — Telehealth: Payer: Self-pay | Admitting: Obstetrics and Gynecology

## 2018-11-21 NOTE — Telephone Encounter (Signed)
Spoke to pt about her concerns for calling the office. Pt is aware that since she received paper work from [employer] that she would need to get in contact with them. Pt was also wondering if she needed to have all blood work and covid-testing redone. Pt was upset and did not understand why. Also her surgery is not scheduled yet which pt was really upset about. Pt was transferred to [facility] to speak more to her about the OR scheduling.

## 2018-11-21 NOTE — Telephone Encounter (Signed)
The patient called to update her e-mail and contact number, The patient stated that she is also requesting a call back from her nurse to ask a few questions and get more information in regards to her upcoming procedure. Please advise.

## 2018-12-04 ENCOUNTER — Other Ambulatory Visit: Payer: Self-pay

## 2018-12-04 ENCOUNTER — Encounter: Payer: Self-pay | Admitting: Obstetrics and Gynecology

## 2018-12-04 ENCOUNTER — Ambulatory Visit (INDEPENDENT_AMBULATORY_CARE_PROVIDER_SITE_OTHER): Payer: PRIVATE HEALTH INSURANCE | Admitting: Obstetrics and Gynecology

## 2018-12-04 VITALS — BP 114/77 | HR 101 | Ht 68.0 in | Wt 162.5 lb

## 2018-12-04 DIAGNOSIS — N941 Unspecified dyspareunia: Secondary | ICD-10-CM

## 2018-12-04 DIAGNOSIS — N921 Excessive and frequent menstruation with irregular cycle: Secondary | ICD-10-CM

## 2018-12-04 DIAGNOSIS — N946 Dysmenorrhea, unspecified: Secondary | ICD-10-CM

## 2018-12-04 DIAGNOSIS — R102 Pelvic and perineal pain: Secondary | ICD-10-CM

## 2018-12-04 DIAGNOSIS — Z975 Presence of (intrauterine) contraceptive device: Secondary | ICD-10-CM

## 2018-12-04 DIAGNOSIS — N926 Irregular menstruation, unspecified: Secondary | ICD-10-CM

## 2018-12-04 DIAGNOSIS — F419 Anxiety disorder, unspecified: Secondary | ICD-10-CM

## 2018-12-04 DIAGNOSIS — F32A Depression, unspecified: Secondary | ICD-10-CM

## 2018-12-04 DIAGNOSIS — F329 Major depressive disorder, single episode, unspecified: Secondary | ICD-10-CM
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
## 2018-12-04 NOTE — Progress Notes (Signed)
Pt present for pre-op/possbile Nexplanon removal.  Pt stated that she wanted her Nexplanon removed due to having prolong bleeding. Pt is thinking about trying a different type of birth control.
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
## 2018-12-04 NOTE — Patient Instructions (Addendum)
Drink preoperative beverage at 4:30 a.m. the day of your surgery and wash abdomen with cleanse.  Nothing to eat or drink  (including medications) after midnight the day before your surgery.    Preparing for Surgery Preparing for surgery is an important part of your care. It can make things go more smoothly and help you avoid complications. The steps leading up to surgery may vary among hospitals. Follow all instructions given to you by your health care providers. Ask questions if you do not understand something. Talk about any concerns that you have. Here are some questions to consider asking before your surgery:  If my surgery is not an emergency (is elective), when would be the best time to have the surgery?  What arrangements do I need to make for work, home, or school?  What will my recovery be like? How long will it be before I can return to normal activities?  Will I need to prepare my home? Will I need to arrange care for me or my children?  Should I expect to have pain after surgery? What are my pain management options? Are there nonmedical options that I can try for pain? Tell a health care provider about:   Any allergies you have.  All medicines you are taking, including vitamins, herbs, eye drops, creams, and over-the-counter medicines.  Any problems you or family members have had with anesthetic medicines.  Any blood disorders you have.  Any surgeries you have had.  Any medical conditions you have.  Whether you are pregnant or may be pregnant. What are the risks? The risks and complications of surgery depend on the specific procedure that you have. Discuss all the risks with your health care providers before your surgery. Ask about common surgical complications, which may include:  Infection.  Bleeding or a need for blood replacement (transfusion).  Allergic reactions to medicines.  Damage to surrounding nerves, tissues, or structures.  A blood  clot.  Scarring.  Failure of the surgery to correct the problem. Follow these instructions before the procedure: Several days or weeks before your procedure  You may have a physical exam by your primary health care provider to make sure it is safe for you to have surgery.  You may have testing. This may include a chest X-ray, blood and urine tests, electrocardiogram (ECG), or other testing.  Ask your health care provider about: ? Changing or stopping your regular medicines. This is especially important if you are taking diabetes medicines or blood thinners. ? Taking medicines such as aspirin and ibuprofen. These medicines can thin your blood. Do not take these medicines unless your health care provider tells you to take them. ? Taking over-the-counter medicines, vitamins, herbs, and supplements.  Do not use any products that contain nicotine or tobacco, such as cigarettes and e-cigarettes. If you need help quitting, ask your health care provider.  Avoid alcohol, or limit your alcohol intake to no more than 1 drink a day for nonpregnant women and 2 drinks a day for men. One drink equals 12 oz of beer, 5 oz of wine, or 1 oz of hard liquor.  Ask your health care provider if there are exercises you can do to prepare for surgery.  Eat a healthy diet. A healthy diet includes low-fat dairy products, low-fat (lean) meats, and fiber from whole grains, beans, and lots of fruits and vegetables.  Plan to have someone take you home from the hospital or clinic.  Plan to have a responsible adult  care for you for at least 24 hours after you leave the hospital or clinic. This is important. The day before your procedure  You may be given antibiotic medicine to take by mouth to help prevent infection. Take it as told by your health care provider.  You may be asked to shower with a germ-killing soap.  Follow instructions from your health care provider about eating and drinking restrictions. The day  of your procedure  You may need to take another shower with a germ-killing soap before you leave home in the morning.  With a small sip of water, take only the medicines that you are told to take.  Do not wear any makeup, nail polish, powder, deodorant, lotion, jewelry, hair accessories, or anything on your skin or body except your clothes.  If you will be staying in the hospital, bring a case to hold your glasses, contacts, or dentures. You may also want to bring your robe and non-skid footwear.  If instructed by your health care provider, bring your sleep apnea device with you on the day of your surgery (if this applies to you).  Arrive at the hospital as scheduled.  Bring a friend or family member with you who can help to answer questions and be present while you meet with your health care provider. At the hospital  When you arrive at the hospital, you will likely: ? Go to the reception desk to notify staff that you have arrived. ? Move to the preoperative and preparation areas before going to the operating room.  You may have to wear compression stockings. These help to prevent blood clots and reduce swelling in your legs.  An IV may be inserted into one of your veins.  In the operating room, you may be given one or more of the following: ? A medicine to help you relax (sedative). ? A medicine to numb the area (local anesthetic). ? A medicine to make you fall asleep (general anesthetic). ? A medicine that is injected into an area of your body to numb everything below the injection site (regional anesthetic).  Your surgical site will be marked or identified.  You may be given an antibiotic through your IV to help prevent infection. Contact a health care provider if you:  Develop a fever of more than 100.85F (38C) or other feelings of illness during the 48 hours before your surgery.  Have symptoms that get worse.  Have questions or concerns about your  surgery. Summary  Preparing for surgery can make the procedure go more smoothly and lower your risk of complications.  Before surgery, make a list of questions and concerns to discuss with your surgeon. Ask about the risks and possible complications.  In the days or weeks before your surgery, follow all instructions from your health care provider. You may need to stop smoking, avoid alcohol, follow eating restrictions, and change or stop your regular medicines.  Contact your surgeon if you develop a fever or other signs of illness during the few days before your surgery. This information is not intended to replace advice given to you by your health care provider. Make sure you discuss any questions you have with your health care provider. Document Released: 01/03/2017 Document Revised: 03/03/2017 Document Reviewed: 01/03/2017 Elsevier Patient Education  2020 [employer].   How to Use Chlorhexidine for Bathing Chlorhexidine gluconate (CHG) is a germ-killing (antiseptic) solution that is used to clean the skin. It can get rid of the bacteria that normally live  on the skin and can keep them away for about 24 hours. To clean your skin with CHG, you may be given:  A CHG solution to use in the shower or as part of a sponge bath.  A prepackaged cloth that contains CHG. Cleaning your skin with CHG may help lower the risk for infection:  While you are staying in the intensive care unit of the hospital.  If you have a vascular access, such as a central line, to provide short-term or long-term access to your veins.  If you have a catheter to drain urine from your bladder.  If you are on a ventilator. A ventilator is a machine that helps you breathe by moving air in and out of your lungs.  After surgery. What are the risks? Risks of using CHG include:  A skin reaction.  Hearing loss, if CHG gets in your ears.  Eye injury, if CHG gets in your eyes and is not rinsed out.  The CHG product  catching fire. Make sure that you avoid smoking and flames after applying CHG to your skin. Do not use CHG:  If you have a chlorhexidine allergy or have previously reacted to chlorhexidine.  On babies younger than 462 months of age. How to use CHG solution  Use CHG only as told by your health care provider, and follow the instructions on the label.  Use the full amount of CHG as directed. Usually, this is one bottle. During a shower Follow these steps when using CHG solution during a shower (unless your health care provider gives you different instructions): 1. Start the shower. 2. Use your normal soap and shampoo to wash your face and hair. 3. Turn off the shower or move out of the shower stream. 4. Pour the CHG onto a clean washcloth. Do not use any type of brush or rough-edged sponge. 5. Starting at your neck, lather your body down to your toes. Make sure you follow these instructions: ? If you will be having surgery, pay special attention to the part of your body where you will be having surgery. Scrub this area for at least 1 minute. ? Do not use CHG on your head or face. If the solution gets into your ears or eyes, rinse them well with water. ? Avoid your genital area. ? Avoid any areas of skin that have broken skin, cuts, or scrapes. ? Scrub your back and under your arms. Make sure to wash skin folds. 6. Let the lather sit on your skin for 1-2 minutes or as long as told by your health care provider. 7. Thoroughly rinse your entire body in the shower. Make sure that all body creases and crevices are rinsed well. 8. Dry off with a clean towel. Do not put any substances on your body afterward--such as powder, lotion, or perfume--unless you are told to do so by your health care provider. Only use lotions that are recommended by the manufacturer. 9. Put on clean clothes or pajamas. 10. If it is the night before your surgery, sleep in clean sheets.  During a sponge bath Follow these  steps when using CHG solution during a sponge bath (unless your health care provider gives you different instructions): 1. Use your normal soap and shampoo to wash your face and hair. 2. Pour the CHG onto a clean washcloth. 3. Starting at your neck, lather your body down to your toes. Make sure you follow these instructions: ? If you will be having surgery, pay special  attention to the part of your body where you will be having surgery. Scrub this area for at least 1 minute. ? Do not use CHG on your head or face. If the solution gets into your ears or eyes, rinse them well with water. ? Avoid your genital area. ? Avoid any areas of skin that have broken skin, cuts, or scrapes. ? Scrub your back and under your arms. Make sure to wash skin folds. 4. Let the lather sit on your skin for 1-2 minutes or as long as told by your health care provider. 5. Using a different clean, wet washcloth, thoroughly rinse your entire body. Make sure that all body creases and crevices are rinsed well. 6. Dry off with a clean towel. Do not put any substances on your body afterward--such as powder, lotion, or perfume--unless you are told to do so by your health care provider. Only use lotions that are recommended by the manufacturer. 7. Put on clean clothes or pajamas. 8. If it is the night before your surgery, sleep in clean sheets. How to use CHG prepackaged cloths  Only use CHG cloths as told by your health care provider, and follow the instructions on the label.  Use the CHG cloth on clean, dry skin.  Do not use the CHG cloth on your head or face unless your health care provider tells you to.  When washing with the CHG cloth: ? Avoid your genital area. ? Avoid any areas of skin that have broken skin, cuts, or scrapes. Before surgery Follow these steps when using a CHG cloth to clean before surgery (unless your health care provider gives you different instructions): 1. Using the CHG cloth, vigorously scrub the  part of your body where you will be having surgery. Scrub using a back-and-forth motion for 3 minutes. The area on your body should be completely wet with CHG when you are done scrubbing. 2. Do not rinse. Discard the cloth and let the area air-dry. Do not put any substances on the area afterward, such as powder, lotion, or perfume. 3. Put on clean clothes or pajamas. 4. If it is the night before your surgery, sleep in clean sheets.  For general bathing Follow these steps when using CHG cloths for general bathing (unless your health care provider gives you different instructions). 1. Use a separate CHG cloth for each area of your body. Make sure you wash between any folds of skin and between your fingers and toes. Wash your body in the following order, switching to a new cloth after each step: ? The front of your neck, shoulders, and chest. ? Both of your arms, under your arms, and your hands. ? Your stomach and groin area, avoiding the genitals. ? Your right leg and foot. ? Your left leg and foot. ? The back of your neck, your back, and your buttocks. 2. Do not rinse. Discard the cloth and let the area air-dry. Do not put any substances on your body afterward--such as powder, lotion, or perfume--unless you are told to do so by your health care provider. Only use lotions that are recommended by the manufacturer. 3. Put on clean clothes or pajamas. Contact a health care provider if:  Your skin gets irritated after scrubbing.  You have questions about using your solution or cloth. Get help right away if:  Your eyes become very red or swollen.  Your eyes itch badly.  Your skin itches badly and is red or swollen.  Your hearing changes.  You have trouble seeing.  You have swelling or tingling in your mouth or throat.  You have trouble breathing.  You swallow any chlorhexidine. Summary  Chlorhexidine gluconate (CHG) is a germ-killing (antiseptic) solution that is used to clean the  skin. Cleaning your skin with CHG may help to lower your risk for infection.  You may be given CHG to use for bathing. It may be in a bottle or in a prepackaged cloth to use on your skin. Carefully follow your health care provider's instructions and the instructions on the product label.  Do not use CHG if you have a chlorhexidine allergy.  Contact your health care provider if your skin gets irritated after scrubbing. This information is not intended to replace advice given to you by your health care provider. Make sure you discuss any questions you have with your health care provider. Document Released: 11/23/2011 Document Revised: 05/17/2018 Document Reviewed: 01/26/2017 Elsevier Patient Education  2020 [employer]   Diagnostic Laparoscopy Diagnostic laparoscopy is a procedure to diagnose diseases in the abdomen. It might be done for a variety of reasons, such as to look for scar tissue, cancer, or a reason for abdomen (abdominal) pain. During the procedure, a thin, flexible tube that has a light and a camera on the end (laparoscope) is inserted through an incision in the abdomen. The image from the camera is shown on a monitor to help your surgeon see inside your body. Tell a health care provider about:  Any allergies you have.  All medicines you are taking, including vitamins, herbs, eye drops, creams, and over-the-counter medicines.  Any problems you or family members have had with anesthetic medicines.  Any blood disorders you have.  Any surgeries you have had.  Any medical conditions you have. What are the risks? Generally, this is a safe procedure. However, problems may occur, including:  Infection.  Bleeding.  Allergic reactions to medicines or dyes.  Damage to abdominal structures or organs, such as the intestines, liver, stomach, or spleen. What happens before the procedure? Medicines  Ask your health care provider about: ? Changing or stopping your regular  medicines. This is especially important if you are taking diabetes medicines or blood thinners. ? Taking medicines such as aspirin and ibuprofen. These medicines can thin your blood. Do not take these medicines unless your health care provider tells you to take them. ? Taking over-the-counter medicines, vitamins, herbs, and supplements.  You may be given antibiotic medicine to help prevent infection. Staying hydrated Follow instructions from your health care provider about hydration, which may include:  Up to 2 hours before the procedure - you may continue to drink clear liquids, such as water, clear fruit juice, black coffee, and plain tea. Eating and drinking restrictions Follow instructions from your health care provider about eating and drinking, which may include:  8 hours before the procedure - stop eating heavy meals or foods such as meat, fried foods, or fatty foods.  6 hours before the procedure - stop eating light meals or foods, such as toast or cereal.  6 hours before the procedure - stop drinking milk or drinks that contain milk.  2 hours before the procedure - stop drinking clear liquids. General instructions  Ask your health care provider how your surgical site will be marked or identified.  You may be asked to shower with a germ-killing soap.  Plan to have someone take you home from the hospital or clinic.  Plan to have a responsible adult care  for you for at least 24 hours after you leave the hospital or clinic. This is important. What happens during the procedure?   To lower your risk of infection: ? Your health care team will wash or sanitize their hands. ? Hair may be removed from the surgical area. ? Your skin will be washed with soap.  An IV will be inserted into one of your veins.  You will be given a medicine to make you fall asleep (general anesthetic). You may also be given a medicine to help you relax (sedative).  A breathing tube will be placed down  your throat to help you breathe during the procedure.  Your abdomen will be filled with an air-like gas so it expands. This will give the surgeon more room to operate and will make your organs easier to see.  Many small incisions will be made in your abdomen.  A laparoscope and other surgical instruments will be inserted into your abdomen through the incisions.  A tissue sample may be removed from an organ for examination (biopsy). This will depend on the reason why you are having this procedure.  The laparoscope and other instruments will be removed from your abdomen.  The gas will be released.  Your incisions will be closed with stitches (sutures) and covered with a bandage (dressing).  Your breathing tube will be removed. The procedure may vary among health care providers and hospitals. What happens after the procedure?   Your blood pressure, heart rate, breathing rate, and blood oxygen level will be monitored until the medicines you were given have worn off.  Do not drive for 24 hours if you were given a sedative during your procedure.  It is up to you to get the results of your procedure. Ask your health care provider, or the department that is doing the procedure, when your results will be ready. Summary  Diagnostic laparoscopy is a way to look for problems in the abdomen using small incisions.  Follow instructions from your health care provider about how to prepare for the procedure.  Plan to have a responsible adult care for you for at least 24 hours after you leave the hospital or clinic. This is important. This information is not intended to replace advice given to you by your health care provider. Make sure you discuss any questions you have with your health care provider. Document Released: 06/06/2000 Document Revised: 02/10/2017 Document Reviewed: 08/24/2016 Elsevier Patient Education  2020 [employer].

## 2018-12-05 ENCOUNTER — Other Ambulatory Visit: Payer: Self-pay

## 2018-12-05 ENCOUNTER — Encounter
Admission: RE | Admit: 2018-12-05 | Discharge: 2018-12-05 | Disposition: A | Discharge status: Home / Self Care | Payer: No Typology Code available for payment source | Source: Ambulatory Visit | Attending: Obstetrics and Gynecology | Admitting: Obstetrics and Gynecology

## 2018-12-05 HISTORY — DX: Other injury of unspecified body region, initial encounter: T14.8XXA

## 2018-12-05 HISTORY — DX: Anxiety disorder, unspecified: F41.9

## 2018-12-05 NOTE — Pre-Procedure Instructions (Signed)
Pt to get covid test on 12/06/2018 and have CBC drawn and get copy of instructions- pt still has CHG soap and Ensure drink from previous PAT appt(surgery was postponed)

## 2018-12-05 NOTE — Patient Instructions (Signed)
Your procedure is scheduled on: December 10, 2018 [place] [facility] to Day Surgery on the 2nd floor of the [facility]. To find out your arrival time, please call [phone] between 1PM - 3PM on: Friday December 07, 2018  REMEMBER: Instructions that are not followed completely may result in serious medical risk, up to and including death; or upon the discretion of your surgeon and anesthesiologist your surgery may need to be rescheduled.  Do not eat food after midnight the night before surgery.  No gum chewing, lozengers or hard candies. NOTHING TO DRINK AFTER MIDNIGHT EXCEPT ENSURE DRINK- FINISH BY 4:30 AM THEN NOTHING BY MOUTH.  Do NOT drink anything that is not on this list.  No Alcohol for 24 hours before or after surgery.  No Smoking including e-cigarettes for 24 hours prior to surgery.  No chewable tobacco products for at least 6 hours prior to surgery.  No nicotine patches on the day of surgery.  On the morning of surgery brush your teeth with toothpaste and water, you may rinse your mouth with mouthwash if you wish. Do not swallow any toothpaste or mouthwash.  Notify your doctor if there is any change in your medical condition (cold, fever, infection).  Do not wear jewelry, make-up, hairpins, clips or nail polish.  Do not wear lotions, powders, or perfumes.   Do not shave 48 hours prior to surgery.   Contacts and dentures may not be worn into surgery.  Do not bring valuables to the hospital, including drivers license, insurance or credit cards.  [facility] is not responsible for any belongings or valuables.   TAKE THESE MEDICATIONS THE MORNING OF SURGERY: BUSPAR CITALOPRAM XANAX IF NEEDED   Use CHG Soap  as directed on instruction sheet.  Stop Anti-inflammatories (NSAIDS) such as Advil, Aleve, Ibuprofen, Motrin, Naproxen, Naprosyn and Aspirin based products such as Excedrin, Goodys Powder, BC Powder. (May take Tylenol or Acetaminophen if needed.)  Stop ANY  OVER THE COUNTER supplements until after surgery. (May continue Vitamin D, Vitamin B, and multivitamin.)  Wear comfortable clothing (specific to your surgery type) to the hospital.  Plan for stool softeners for home use.  If you are being admitted to the hospital overnight, leave your suitcase in the car. After surgery it may be brought to your room.  If you are being discharged the day of surgery, you will not be allowed to drive home. You will need a responsible adult to drive you home and stay with you that night.   If you are taking public transportation, you will need to have a responsible adult with you. Please confirm with your physician that it is acceptable to use public transportation.   Please call [phone] if you have any questions about these instructions.

## 2018-12-06 ENCOUNTER — Other Ambulatory Visit
Admission: RE | Admit: 2018-12-06 | Discharge: 2018-12-06 | Disposition: A | Discharge status: Home / Self Care | Payer: PRIVATE HEALTH INSURANCE | Source: Ambulatory Visit | Attending: Obstetrics and Gynecology | Admitting: Obstetrics and Gynecology

## 2018-12-06 DIAGNOSIS — Z01812 Encounter for preprocedural laboratory examination: Secondary | ICD-10-CM | POA: Diagnosis present

## 2018-12-06 DIAGNOSIS — Z20828 Contact with and (suspected) exposure to other viral communicable diseases: Secondary | ICD-10-CM | POA: Insufficient documentation

## 2018-12-06 LAB — CBC
HCT: 41 % (ref 36.0–46.0)
Hemoglobin: 13.7 g/dL (ref 12.0–15.0)
MCH: 28.5 pg (ref 26.0–34.0)
MCHC: 33.4 g/dL (ref 30.0–36.0)
MCV: 85.4 fL (ref 80.0–100.0)
Platelets: 203 10*3/uL (ref 150–400)
RBC: 4.8 MIL/uL (ref 3.87–5.11)
RDW: 13.1 % (ref 11.5–15.5)
WBC: 5.4 10*3/uL (ref 4.0–10.5)
nRBC: 0 % (ref 0.0–0.2)

## 2018-12-07 LAB — SARS CORONAVIRUS 2 (TAT 6-24 HRS): SARS Coronavirus 2: NEGATIVE

## 2018-12-10 ENCOUNTER — Ambulatory Visit: Payer: PRIVATE HEALTH INSURANCE | Admitting: Certified Registered Nurse Anesthetist

## 2018-12-10 ENCOUNTER — Encounter: Payer: Self-pay | Admitting: *Deleted

## 2018-12-10 ENCOUNTER — Ambulatory Visit
Admission: RE | Admit: 2018-12-10 | Discharge: 2018-12-10 | Disposition: A | Discharge status: Home / Self Care | Payer: PRIVATE HEALTH INSURANCE | Attending: Obstetrics and Gynecology | Admitting: Obstetrics and Gynecology

## 2018-12-10 ENCOUNTER — Other Ambulatory Visit: Payer: Self-pay

## 2018-12-10 ENCOUNTER — Encounter
Admission: RE | Disposition: A | Discharge status: Home / Self Care | Payer: Self-pay | Source: Home / Self Care | Attending: Obstetrics and Gynecology

## 2018-12-10 DIAGNOSIS — Z3049 Encounter for surveillance of other contraceptives: Secondary | ICD-10-CM | POA: Insufficient documentation

## 2018-12-10 DIAGNOSIS — F329 Major depressive disorder, single episode, unspecified: Secondary | ICD-10-CM | POA: Diagnosis not present

## 2018-12-10 DIAGNOSIS — Z79899 Other long term (current) drug therapy: Secondary | ICD-10-CM | POA: Insufficient documentation

## 2018-12-10 DIAGNOSIS — F419 Anxiety disorder, unspecified: Secondary | ICD-10-CM | POA: Insufficient documentation

## 2018-12-10 DIAGNOSIS — R102 Pelvic and perineal pain: Secondary | ICD-10-CM

## 2018-12-10 DIAGNOSIS — Z3046 Encounter for surveillance of implantable subdermal contraceptive: Secondary | ICD-10-CM

## 2018-12-10 DIAGNOSIS — N736 Female pelvic peritoneal adhesions (postinfective): Secondary | ICD-10-CM | POA: Diagnosis not present

## 2018-12-10 DIAGNOSIS — K828 Other specified diseases of gallbladder: Secondary | ICD-10-CM

## 2018-12-10 DIAGNOSIS — N938 Other specified abnormal uterine and vaginal bleeding: Secondary | ICD-10-CM | POA: Insufficient documentation

## 2018-12-10 DIAGNOSIS — Z9889 Other specified postprocedural states: Secondary | ICD-10-CM

## 2018-12-10 DIAGNOSIS — Z975 Presence of (intrauterine) contraceptive device: Secondary | ICD-10-CM

## 2018-12-10 DIAGNOSIS — N946 Dysmenorrhea, unspecified: Secondary | ICD-10-CM | POA: Insufficient documentation

## 2018-12-10 DIAGNOSIS — N941 Unspecified dyspareunia: Secondary | ICD-10-CM

## 2018-12-10 DIAGNOSIS — F17210 Nicotine dependence, cigarettes, uncomplicated: Secondary | ICD-10-CM | POA: Insufficient documentation

## 2018-12-10 DIAGNOSIS — N83292 Other ovarian cyst, left side: Secondary | ICD-10-CM | POA: Diagnosis not present

## 2018-12-10 HISTORY — PX: LAPAROSCOPY: SHX197

## 2018-12-10 HISTORY — PX: REMOVAL OF NON VAGINAL CONTRACEPTIVE DEVICE: SHX6219

## 2018-12-10 LAB — TYPE AND SCREEN
ABO/RH(D): B NEG
Antibody Screen: NEGATIVE

## 2018-12-10 LAB — POCT PREGNANCY, URINE: Preg Test, Ur: NEGATIVE

## 2018-12-10 SURGERY — LAPAROSCOPY OPERATIVE
Anesthesia: General | Site: Arm Upper | Laterality: Right

## 2018-12-10 MED ORDER — SUCCINYLCHOLINE CHLORIDE 20 MG/ML IJ SOLN
INTRAMUSCULAR | Status: DC | PRN
  Administered 2018-12-10: 100 mg via INTRAVENOUS

## 2018-12-10 MED ORDER — OXYCODONE HCL 5 MG/5ML PO SOLN: 5.0000 mg | Freq: Once | ORAL | Status: DC | PRN

## 2018-12-10 MED ORDER — FENTANYL CITRATE (PF) 100 MCG/2ML IJ SOLN
INTRAMUSCULAR | Status: AC
  Filled 2018-12-10: qty 2

## 2018-12-10 MED ORDER — GABAPENTIN 300 MG PO CAPS
ORAL_CAPSULE | ORAL | Status: AC
  Administered 2018-12-10: 300 mg via ORAL
  Filled 2018-12-10: qty 1

## 2018-12-10 MED ORDER — MIDAZOLAM HCL 2 MG/2ML IJ SOLN
INTRAMUSCULAR | Status: AC
  Filled 2018-12-10: qty 2

## 2018-12-10 MED ORDER — DEXAMETHASONE SODIUM PHOSPHATE 10 MG/ML IJ SOLN
INTRAMUSCULAR | Status: DC | PRN
  Administered 2018-12-10: 10 mg via INTRAVENOUS

## 2018-12-10 MED ORDER — LIDOCAINE HCL (PF) 2 % IJ SOLN
INTRAMUSCULAR | Status: AC
  Filled 2018-12-10: qty 10

## 2018-12-10 MED ORDER — DEXAMETHASONE SODIUM PHOSPHATE 10 MG/ML IJ SOLN
INTRAMUSCULAR | Status: AC
  Filled 2018-12-10: qty 1

## 2018-12-10 MED ORDER — SUGAMMADEX SODIUM 200 MG/2ML IV SOLN
INTRAVENOUS | Status: DC | PRN
  Administered 2018-12-10: 150 mg via INTRAVENOUS

## 2018-12-10 MED ORDER — FENTANYL CITRATE (PF) 100 MCG/2ML IJ SOLN
25.0000 ug | INTRAMUSCULAR | Status: DC | PRN
  Administered 2018-12-10 (×2): 25 ug via INTRAVENOUS

## 2018-12-10 MED ORDER — SUGAMMADEX SODIUM 200 MG/2ML IV SOLN
INTRAVENOUS | Status: AC
  Filled 2018-12-10: qty 2

## 2018-12-10 MED ORDER — OXYCODONE-ACETAMINOPHEN 5-325 MG PO TABS
ORAL_TABLET | ORAL | Status: AC
  Filled 2018-12-10: qty 1

## 2018-12-10 MED ORDER — ACETAMINOPHEN 500 MG PO TABS
1000.0000 mg | ORAL_TABLET | ORAL | Status: AC
  Administered 2018-12-10: 11:00:00 1000 mg via ORAL

## 2018-12-10 MED ORDER — FENTANYL CITRATE (PF) 100 MCG/2ML IJ SOLN
INTRAMUSCULAR | Status: DC | PRN
  Administered 2018-12-10: 100 ug via INTRAVENOUS

## 2018-12-10 MED ORDER — MIDAZOLAM HCL 2 MG/2ML IJ SOLN
INTRAMUSCULAR | Status: DC | PRN
  Administered 2018-12-10: 2 mg via INTRAVENOUS

## 2018-12-10 MED ORDER — PROPOFOL 10 MG/ML IV BOLUS
INTRAVENOUS | Status: DC | PRN
  Administered 2018-12-10: 30 mg via INTRAVENOUS
  Administered 2018-12-10: 160 mg via INTRAVENOUS

## 2018-12-10 MED ORDER — LACTATED RINGERS IV SOLN: INTRAVENOUS | Status: DC

## 2018-12-10 MED ORDER — ONDANSETRON HCL 4 MG/2ML IJ SOLN
INTRAMUSCULAR | Status: DC | PRN
  Administered 2018-12-10: 4 mg via INTRAVENOUS

## 2018-12-10 MED ORDER — ROCURONIUM BROMIDE 100 MG/10ML IV SOLN
INTRAVENOUS | Status: DC | PRN
  Administered 2018-12-10: 5 mg via INTRAVENOUS
  Administered 2018-12-10: 35 mg via INTRAVENOUS
  Administered 2018-12-10: 10 mg via INTRAVENOUS

## 2018-12-10 MED ORDER — ROCURONIUM BROMIDE 50 MG/5ML IV SOLN
INTRAVENOUS | Status: AC
  Filled 2018-12-10: qty 1

## 2018-12-10 MED ORDER — OXYCODONE-ACETAMINOPHEN 5-325 MG PO TABS: 1.0000 | ORAL_TABLET | ORAL | 0 refills | Status: DC | PRN

## 2018-12-10 MED ORDER — SUCCINYLCHOLINE CHLORIDE 20 MG/ML IJ SOLN
INTRAMUSCULAR | Status: AC
  Filled 2018-12-10: qty 1

## 2018-12-10 MED ORDER — LIDOCAINE HCL (CARDIAC) PF 100 MG/5ML IV SOSY
PREFILLED_SYRINGE | INTRAVENOUS | Status: DC | PRN
  Administered 2018-12-10: 100 mg via INTRAVENOUS

## 2018-12-10 MED ORDER — PROMETHAZINE HCL 25 MG/ML IJ SOLN: 6.2500 mg | INTRAMUSCULAR | Status: DC | PRN

## 2018-12-10 MED ORDER — PROPOFOL 10 MG/ML IV BOLUS
INTRAVENOUS | Status: AC
  Filled 2018-12-10: qty 40

## 2018-12-10 MED ORDER — SCOPOLAMINE 1 MG/3DAYS TD PT72
MEDICATED_PATCH | TRANSDERMAL | Status: AC
  Administered 2018-12-10: 11:00:00 1.5 mg via TRANSDERMAL
  Filled 2018-12-10: qty 1

## 2018-12-10 MED ORDER — BUPIVACAINE HCL 0.5 % IJ SOLN
INTRAMUSCULAR | Status: DC | PRN
  Administered 2018-12-10: 14 mL
  Administered 2018-12-10: 1 mL

## 2018-12-10 MED ORDER — ONDANSETRON HCL 4 MG/2ML IJ SOLN
INTRAMUSCULAR | Status: AC
  Filled 2018-12-10: qty 2

## 2018-12-10 MED ORDER — MEPERIDINE HCL 50 MG/ML IJ SOLN: 6.2500 mg | INTRAMUSCULAR | Status: DC | PRN

## 2018-12-10 MED ORDER — SCOPOLAMINE 1 MG/3DAYS TD PT72
1.0000 | MEDICATED_PATCH | TRANSDERMAL | Status: DC
  Administered 2018-12-10: 11:00:00 1.5 mg via TRANSDERMAL

## 2018-12-10 MED ORDER — ACETAMINOPHEN 500 MG PO TABS
ORAL_TABLET | ORAL | Status: AC
  Administered 2018-12-10: 1000 mg via ORAL
  Filled 2018-12-10: qty 2

## 2018-12-10 MED ORDER — SUGAMMADEX SODIUM 200 MG/2ML IV SOLN: INTRAVENOUS | Status: DC | PRN

## 2018-12-10 MED ORDER — LACTATED RINGERS IV SOLN
INTRAVENOUS | Status: DC
  Administered 2018-12-10: 11:00:00 via INTRAVENOUS

## 2018-12-10 MED ORDER — OXYCODONE-ACETAMINOPHEN 5-325 MG PO TABS
1.0000 | ORAL_TABLET | ORAL | Status: DC | PRN
  Administered 2018-12-10: 1 via ORAL

## 2018-12-10 MED ORDER — OXYCODONE HCL 5 MG PO TABS: 5.0000 mg | ORAL_TABLET | Freq: Once | ORAL | Status: DC | PRN

## 2018-12-10 MED ORDER — SIMETHICONE 80 MG PO CHEW: 80.0000 mg | CHEWABLE_TABLET | Freq: Four times a day (QID) | ORAL | 1 refills | Status: DC | PRN

## 2018-12-10 MED ORDER — FAMOTIDINE 20 MG PO TABS
20.0000 mg | ORAL_TABLET | Freq: Once | ORAL | Status: AC
  Administered 2018-12-10: 11:00:00 20 mg via ORAL

## 2018-12-10 MED ORDER — DEXMEDETOMIDINE HCL 200 MCG/2ML IV SOLN
INTRAVENOUS | Status: DC | PRN
  Administered 2018-12-10: 8 ug via INTRAVENOUS
  Administered 2018-12-10: 4 ug via INTRAVENOUS

## 2018-12-10 MED ORDER — FENTANYL CITRATE (PF) 100 MCG/2ML IJ SOLN
INTRAMUSCULAR | Status: AC
  Administered 2018-12-10: 25 ug via INTRAVENOUS
  Filled 2018-12-10: qty 2

## 2018-12-10 MED ORDER — GABAPENTIN 300 MG PO CAPS
300.0000 mg | ORAL_CAPSULE | ORAL | Status: AC
  Administered 2018-12-10: 11:00:00 300 mg via ORAL

## 2018-12-10 MED ORDER — FAMOTIDINE 20 MG PO TABS
ORAL_TABLET | ORAL | Status: AC
  Administered 2018-12-10: 11:00:00 20 mg via ORAL
  Filled 2018-12-10: qty 1

## 2018-12-10 MED ORDER — IBUPROFEN 800 MG PO TABS: 800.0000 mg | ORAL_TABLET | Freq: Three times a day (TID) | ORAL | 1 refills | Status: DC | PRN

## 2018-12-10 MED ORDER — XULANE 150-35 MCG/24HR TD PTWK: 1.0000 | MEDICATED_PATCH | TRANSDERMAL | 12 refills | Status: DC

## 2018-12-10 SURGICAL SUPPLY — 41 items
BENZOIN TINCTURE PRP APPL 2/3 (GAUZE/BANDAGES/DRESSINGS) ×4 IMPLANT
BLADE SURG SZ11 CARB STEEL (BLADE) ×8 IMPLANT
BNDG COHESIVE 4X5 TAN STRL (GAUZE/BANDAGES/DRESSINGS) ×4 IMPLANT
CANISTER SUCT 1200ML W/VALVE (MISCELLANEOUS) ×4 IMPLANT
CATH ROBINSON RED A/P 16FR (CATHETERS) ×4 IMPLANT
CHLORAPREP W/TINT 26 (MISCELLANEOUS) ×4 IMPLANT
CORD MONOPOLAR M/FML 12FT (MISCELLANEOUS) IMPLANT
COVER WAND RF STERILE (DRAPES) IMPLANT
DERMABOND ADVANCED (GAUZE/BANDAGES/DRESSINGS) ×2
DERMABOND ADVANCED .7 DNX12 (GAUZE/BANDAGES/DRESSINGS) ×2 IMPLANT
GLOVE BIO SURGEON STRL SZ 6.5 (GLOVE) ×3 IMPLANT
GLOVE BIO SURGEON STRL SZ8 (GLOVE) ×4 IMPLANT
GLOVE BIO SURGEONS STRL SZ 6.5 (GLOVE) ×1
GLOVE INDICATOR 7.0 STRL GRN (GLOVE) ×4 IMPLANT
GOWN STRL REUS W/ TWL LRG LVL3 (GOWN DISPOSABLE) ×4 IMPLANT
GOWN STRL REUS W/TWL LRG LVL3 (GOWN DISPOSABLE) ×4
GOWN STRL REUS W/TWL XL LVL4 (GOWN DISPOSABLE) ×4 IMPLANT
GRASPER SUT TROCAR 14GX15 (MISCELLANEOUS) IMPLANT
IRRIGATION STRYKERFLOW (MISCELLANEOUS) IMPLANT
IRRIGATOR STRYKERFLOW (MISCELLANEOUS)
IV LACTATED RINGERS 1000ML (IV SOLUTION) IMPLANT
KIT PINK PAD W/HEAD ARE REST (MISCELLANEOUS) ×4
KIT PINK PAD W/HEAD ARM REST (MISCELLANEOUS) ×2 IMPLANT
KIT TURNOVER CYSTO (KITS) ×4 IMPLANT
NS IRRIG 500ML POUR BTL (IV SOLUTION) ×4 IMPLANT
PACK GYN LAPAROSCOPIC (MISCELLANEOUS) ×4 IMPLANT
PAD OB MATERNITY 4.3X12.25 (PERSONAL CARE ITEMS) ×4 IMPLANT
PAD PREP 24X41 OB/GYN DISP (PERSONAL CARE ITEMS) ×4 IMPLANT
POUCH ENDO CATCH 10MM SPEC (MISCELLANEOUS) IMPLANT
POUCH SPECIMEN RETRIEVAL 10MM (ENDOMECHANICALS) IMPLANT
SCISSORS METZENBAUM CVD 33 (INSTRUMENTS) ×4 IMPLANT
SET TUBE SMOKE EVAC HIGH FLOW (TUBING) ×4 IMPLANT
SHEARS HARMONIC ACE PLUS 36CM (ENDOMECHANICALS) IMPLANT
SLEEVE ENDOPATH XCEL 5M (ENDOMECHANICALS) ×8 IMPLANT
SUT VIC AB 3-0 SH 27 (SUTURE) ×2
SUT VIC AB 3-0 SH 27X BRD (SUTURE) ×2 IMPLANT
SUT VICRYL 0 AB UR-6 (SUTURE) IMPLANT
TAPE STRIPS DRAPE STRL (GAUZE/BANDAGES/DRESSINGS) ×4 IMPLANT
TROCAR ENDO BLADELESS 11MM (ENDOMECHANICALS) IMPLANT
TROCAR XCEL NON-BLD 5MMX100MML (ENDOMECHANICALS) ×4 IMPLANT
TROCAR XCEL UNIV SLVE 11M 100M (ENDOMECHANICALS) IMPLANT

## 2018-12-10 NOTE — Transfer of Care (Signed)
Immediate Anesthesia Transfer of Care Note  Patient: Sandra Rivera  Procedure(s) Performed: LAPAROSCOPY OPERATIVE WITH BIOPSIES (N/A ) REMOVAL OF NON VAGINAL CONTRACEPTIVE DEVICE (Right Arm Upper)  Patient Location: PACU  Anesthesia Type:General  Level of Consciousness: sedated  Airway & Oxygen Therapy: Patient Spontanous Breathing and Patient connected to face mask oxygen  Post-op Assessment: Report given to RN and Post -op Vital signs reviewed and stable  Post vital signs: Reviewed and stable  Last Vitals:  Vitals Value Taken Time  BP 116/74 12/10/18 1318  Temp    Pulse 84 12/10/18 1319  Resp 15 12/10/18 1319  SpO2 100 % 12/10/18 1319  Vitals shown include unvalidated device data.  Last Pain:  Vitals:   12/10/18 1014  TempSrc: Oral  PainSc: 5          Complications: No apparent anesthesia complications

## 2018-12-10 NOTE — Interval H&P Note (Signed)
History and Physical Interval Note:  12/10/2018 11:30 AM  Sandra Rivera  has presented today for surgery, with the diagnosis of PELVIC PAIN,DYSPAREUNIA, DYSMENORRHEA, ABNORMAL MENSTRUAL PERIODS.  The various methods of treatment have been discussed with the patient and family. After consideration of risks, benefits and other options for treatment, the patient has consented to  Procedure(s): LAPAROSCOPY OPERATIVE WITH BIOPSIES (N/A) and [place] as a surgical intervention.  The patient's history has been reviewed, patient examined, no change in status, stable for surgery.  I have reviewed the patient's chart and labs.  Questions were answered to the patient's satisfaction.     [name], MD [facility]

## 2018-12-10 NOTE — Anesthesia Postprocedure Evaluation (Signed)
Anesthesia Post Note  Patient: Sandra Rivera  Procedure(s) Performed: LAPAROSCOPY OPERATIVE WITH BIOPSIES (N/A ) REMOVAL OF NON VAGINAL CONTRACEPTIVE DEVICE (Right Arm Upper)  Patient location during evaluation: PACU Anesthesia Type: General Level of consciousness: awake and alert Pain management: pain level controlled Vital Signs Assessment: post-procedure vital signs reviewed and stable Respiratory status: spontaneous breathing, nonlabored ventilation, respiratory function stable and patient connected to nasal cannula oxygen Cardiovascular status: blood pressure returned to baseline and stable Postop Assessment: no apparent nausea or vomiting Anesthetic complications: no     Last Vitals:  Vitals:   12/10/18 1348 12/10/18 1407  BP: 121/84 113/82  Pulse: 88 69  Resp: 14 16  Temp: (!) 36.2 C (!) 36.1 C  SpO2: 100% 100%    Last Pain:  Vitals:   12/10/18 1407  TempSrc: Temporal  PainSc: 8                  Martha Clan

## 2018-12-10 NOTE — Op Note (Signed)
Procedure(s): LAPAROSCOPY OPERATIVE WITH BIOPSIES REMOVAL OF NON VAGINAL CONTRACEPTIVE DEVICE Procedure Note  Sandra Rivera female 23 y.o. 12/10/2018  Indications: The patient is a 23 y.o. G51P1001 female with pelvic pain, dysmenorrhea, dyspareunia, and dysfunctional uterine bleeding s/p several failed medical therapies.  Also with Nexplanon in place for contraception desiring removal.   Pre-operative Diagnosis: Pelvic pain, dysmenorrhea, dyspareunia, dysfunctional uterine bleeding, and Nexplanon in place.  Post-operative Diagnosis: Same, with pelvic adhesions, gallbladder enlargement, simple left ovarian cyst.  Surgeon: Hildred Laser, MD  Assistants:  Brennan Bailey, MD. No other capable assistant available in surgery requiring a high level assistant.   Anesthesia: General endotracheal anesthesia   Findings: The uterus was normal size and appearance.  Fallopian tubes appeared normal.   Left ovary with small simple cyst. Right ovary normal appearing.  Posterior Cul-de-sac with several areas of pseudo-fenestrations and scarring.  Ovarian fossa bilaterally, bladder flap, and anterior cul-de-sac appeared normal.  Enlargement of gallbladder appreciated.  Remainder of upper abdomen appeared normal.  Normal appearing appendix.  Nexplanon in place in right forearm.   Procedure Details: The patient was seen in the Holding Room. The risks, benefits, complications, treatment options, and expected outcomes were discussed with the patient.  The patient concurred with the proposed plan, giving informed consent.  The site of surgery properly noted/marked. The patient was taken to the Operating Room, identified as Sandra Rivera and the procedure verified as Procedure(s) (LRB): LAPAROSCOPY OPERATIVE WITH BIOPSIES (N/A), REMOVAL OF NON VAGINAL CONTRACEPTIVE DEVICE -NEXPLANON (Right). A Time Out was held and the above information confirmed.  She was then placed under general anesthesia without  difficulty. She was placed in the dorsal lithotomy position, and was prepped and draped in a sterile manner.  A straight catheterization was performed. A sterile speculum was inserted into the vagina and the cervix was grasped at the anterior lip using a single-toothed tenaculum.  A Hulka clamp was placed for uterine manipulation.  The speculum and tenaculum were then removed. After an adequate timeout was performed, attention was turned to the abdomen where an umbilical incision was made with the scalpel.  The Optiview 5-mm trocar and sleeve were then advanced without difficulty with the laparoscope under direct visualization into the abdomen.  The abdomen was then insufflated with carbon dioxide gas and adequate pneumoperitoneum was obtained. Two 5-mm bilateral lower quadrant ports were then placed under direct visualization.  A survey of the patient's pelvis and abdomen revealed the findings as above. Adhesions of the small bowel were noted to the right pelvic sidewall and right adnexa were present, lysed using cold shears.  A thick adhesion band was noted between the large bowel and left pelvic sidewall which was also lysed using cold shears.  Next, several biopsies of the posterior cul-de-sac were obtained at the sites where pseudo-fenestrations and fibrosis were present. Good hemostasis was then achieved.  All trocars were removed under direct visualization, and the abdomen which was desufflated.  All skin incisions were closed with Dermbond.  Prior to closure, the incisions were injected with a total of 7 cc of 5% Sensorcaine.  The Hulka clamp was removed from the uterus.  Next, attention was turned to the patient's right arm. The Nexplanon site was identified.  The area was prepped in usual sterile fashon. One ml of 1% lidocaine was used to anesthetize the area at the distal end of the implant. A small stab incision was made right beside the implant on the distal portion.  The Nexplanon rod  was grasped  using hemostats and removed without difficulty.  There was minimal blood loss. There were no complications. Steri-strips were applied over the small incision.  A pressure bandage was applied to reduce any bruising.  The patient is planning to use contraceptive patch Marilu Favre) for contraception.   The patient tolerated the procedures well.  All instruments, needles, and sponge counts were correct x 2. The patient was taken to the recovery room awake, extubated and in stable condition.    Estimated Blood Loss: minimal      Drains: straight catheterization prior to procedure with  20 ml of clear urine         Total IV Fluids:  Total I/O In: -  Out: 20 [Urine:20]  ml  Specimens: Biopsies of posterior cul-de-sac.         Implants: None         Complications:  None; patient tolerated the procedure well.         Disposition: PACU - hemodynamically stable.         Condition: stable   [name], MD [facility]

## 2018-12-10 NOTE — Anesthesia Post-op Follow-up Note (Signed)
Anesthesia QCDR form completed.        

## 2018-12-10 NOTE — Anesthesia Procedure Notes (Signed)
Procedure Name: Intubation Performed by: Demetrius Charity, CRNA Pre-anesthesia Checklist: Patient identified, Patient being monitored, Timeout performed, Emergency Drugs available and Suction available Patient Re-evaluated:Patient Re-evaluated prior to induction Oxygen Delivery Method: Circle system utilized Preoxygenation: Pre-oxygenation with 100% oxygen Induction Type: IV induction Ventilation: Mask ventilation without difficulty Laryngoscope Size: Mac and 3 Grade View: Grade I Tube type: Oral Tube size: 7.0 mm Number of attempts: 1 Airway Equipment and Method: Stylet Placement Confirmation: ETT inserted through vocal cords under direct vision,  positive ETCO2 and breath sounds checked- equal and bilateral Secured at: 21 cm Tube secured with: Tape Dental Injury: Teeth and Oropharynx as per pre-operative assessment
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
## 2018-12-10 NOTE — Discharge Instructions (Addendum)
Nexplanon Removal Instructions  1. Remove arm bandage in the morning after showering.  2. You may use Ibuprofen or Tylenol for your arm pain as needed.  3. If transitioning to another form of contraception, use a back up method for 2 weeks.       Diagnostic Laparoscopy, Care After This sheet gives you information about how to care for yourself after your procedure. Your health care provider may also give you more specific instructions. If you have problems or questions, contact your health care provider. What can I expect after the procedure? After the procedure, it is common to have:  Mild discomfort in the abdomen.  Sore throat. Women who have laparoscopy with pelvic examination may have mild cramping and fluid coming from the vagina for a few days after the procedure. Follow these instructions at home: Medicines  Take over-the-counter and prescription medicines only as told by your health care provider.  If you were prescribed an antibiotic medicine, take it as told by your health care provider. Do not stop taking the antibiotic even if you start to feel better. Driving  Do not drive for 24 hours if you were given a medicine to help you relax (sedative) during your procedure.  Do not drive or use heavy machinery while taking prescription pain medicine. Bathing  Do not take baths, swim, or use a hot tub until your health care provider approves. You may take showers. Incision care   Follow instructions from your health care provider about how to take care of your incisions. Make sure you: ? Wash your hands with soap and water before you change your bandage (dressing). If soap and water are not available, use hand sanitizer. ? Change your dressing as told by your health care provider. ? Leave stitches (sutures), skin glue, or adhesive strips in place. These skin closures may need to stay in place for 2 weeks or longer. If adhesive strip edges start to loosen and curl up, you  may trim the loose edges. Do not remove adhesive strips completely unless your health care provider tells you to do that.  Check your incision areas every day for signs of infection. Check for: ? Redness, swelling, or pain. ? Fluid or blood. ? Warmth. ? Pus or a bad smell. Activity  Return to your normal activities as told by your health care provider. Ask your health care provider what activities are safe for you.  Do not lift anything that is heavier than 10 lb (4.5 kg), or the limit that you are told, until your health care provider says that it is safe. General instructions  To prevent or treat constipation while you are taking prescription pain medicine, your health care provider may recommend that you: ? Drink enough fluid to keep your urine pale yellow. ? Take over-the-counter or prescription medicines. ? Eat foods that are high in fiber, such as fresh fruits and vegetables, whole grains, and beans. ? Limit foods that are high in fat and processed sugars, such as fried and sweet foods.  Do not use any products that contain nicotine or tobacco, such as cigarettes and e-cigarettes. If you need help quitting, ask your health care provider.  Keep all follow-up visits as told by your health care provider. This is important. Contact a health care provider if:  You develop shoulder pain.  You feel lightheaded or faint.  You are unable to pass gas or have a bowel movement.  You feel nauseous or you vomit.  You develop a  rash.  You have redness, swelling, or pain around any incision.  You have fluid or blood coming from any incision.  Any incision feels warm to the touch.  You have pus or a bad smell coming from any incision.  You have a fever or chills. Get help right away if:  You have severe pain.  You have vomiting that does not go away.  You have heavy bleeding from the vagina.  Any incision opens.  You have trouble breathing.  You have chest  pain. Summary  After the procedure, it is common to have mild discomfort in the abdomen and a sore throat.  Check your incision areas every day for signs of infection.  Return to your normal activities as told by your health care provider. Ask your health care provider what activities are safe for you. This information is not intended to replace advice given to you by your health care provider. Make sure you discuss any questions you have with your health care provider. Document Released: 02/09/2015 Document Revised: 02/10/2017 Document Reviewed: 08/24/2016 Elsevier Patient Education  2020 [place]   1) The drugs that you were given will stay in your system until tomorrow so for the next 24 hours you should not:  A) Drive an automobile B) Make any legal decisions C) Drink any alcoholic beverage   2) You may resume regular meals tomorrow.  Today it is better to start with liquids and gradually work up to solid foods.  You may eat anything you prefer, but it is better to start with liquids, then soup and crackers, and gradually work up to solid foods.   3) Please notify your doctor immediately if you have any unusual bleeding, trouble breathing, redness and pain at the surgery site, drainage, fever, or pain not relieved by medication.    4) Additional Instructions:        Please contact your physician with any problems or Same Day Surgery at [phone], Monday through Friday 6 am to 4 pm, or [place] at [facility] number at [phone].

## 2018-12-11 ENCOUNTER — Encounter: Payer: Self-pay | Admitting: Obstetrics and Gynecology

## 2018-12-12 LAB — SURGICAL PATHOLOGY

## 2018-12-19 ENCOUNTER — Encounter: Payer: Self-pay | Admitting: Obstetrics and Gynecology

## 2018-12-19 ENCOUNTER — Other Ambulatory Visit: Payer: Self-pay

## 2018-12-19 ENCOUNTER — Ambulatory Visit (INDEPENDENT_AMBULATORY_CARE_PROVIDER_SITE_OTHER): Payer: PRIVATE HEALTH INSURANCE | Admitting: Obstetrics and Gynecology

## 2018-12-19 ENCOUNTER — Other Ambulatory Visit (HOSPITAL_COMMUNITY)
Admission: RE | Admit: 2018-12-19 | Discharge: 2018-12-19 | Disposition: A | Discharge status: Home / Self Care | Payer: PRIVATE HEALTH INSURANCE | Source: Ambulatory Visit | Attending: Obstetrics and Gynecology | Admitting: Obstetrics and Gynecology

## 2018-12-19 VITALS — BP 133/81 | HR 120 | Ht 67.0 in | Wt 161.7 lb

## 2018-12-19 DIAGNOSIS — Z124 Encounter for screening for malignant neoplasm of cervix: Secondary | ICD-10-CM

## 2018-12-19 DIAGNOSIS — K828 Other specified diseases of gallbladder: Secondary | ICD-10-CM

## 2018-12-19 DIAGNOSIS — Z8619 Personal history of other infectious and parasitic diseases: Secondary | ICD-10-CM

## 2018-12-19 DIAGNOSIS — Z4889 Encounter for other specified surgical aftercare: Secondary | ICD-10-CM

## 2018-12-19 DIAGNOSIS — K5909 Other constipation: Secondary | ICD-10-CM

## 2018-12-19 HISTORY — DX: Personal history of other infectious and parasitic diseases: Z86.19

## 2018-12-19 MED ORDER — ONDANSETRON 4 MG PO TBDP: 4.0000 mg | ORAL_TABLET | Freq: Three times a day (TID) | ORAL | 0 refills | Status: DC | PRN

## 2018-12-19 NOTE — Progress Notes (Signed)
OBSTETRICS/GYNECOLOGY POST-OPERATIVE CLINIC VISIT  Subjective:     Sandra Rivera is a 23 y.o. female who presents to the clinic 1 weeks status post diagnostic laparoscopy with biopsies for pelvic pain and dysmenorrhea, menorrhagia, for assessment of possible endometriosis. She also had her Nexplanon removed.  Just initiated birth control patch for contraception and management of her cycles last week. Eating a regular diet without difficulty (however still requires use of daily nausea medications to tolerate food, however this has been an ongoing issue prior to her surgery). Bowel movements are abnormal with constipation, however this has also been an issue since prior to her surgery.   Pain is controlled with current analgesics. Medications being used: prescription NSAID's including Motrin.    The following portions of the patient's history were reviewed and updated as appropriate: allergies, current medications, past family history, past medical history, past social history, past surgical history and problem list.  Review of Systems Pertinent items noted in HPI and remainder of comprehensive ROS otherwise negative.    Objective:    BP 133/81   Pulse (!) 120   Ht 5\' 7"  (1.702 m)   Wt 161 lb 11.2 oz (73.3 kg)   LMP  (LMP Unknown)   BMI 25.33 kg/m  General:  alert and no distress  Abdomen: soft, bowel sounds active, non-tender  Incision:   healing well, no drainage, no erythema, no hernia, no seroma, no swelling, no dehiscence, incision well approximated    Pathology:   DIAGNOSIS:  A. POSTERIOR CUL-DE-SAC; BIOPSY:  - BENIGN FIBROADIPOSE TISSUE WITH FIBROSIS/SCAR AND REACTIVE MESOTHELIAL  LINING.  - NO ENDOMETRIAL GLANDS OR STROMA IDENTIFIED.  - NEGATIVE FOR MALIGNANCY.  Assessment:   Post-operatively doing well.  Chronic constipation Enlarged gallbladder History of chlamydia infection Cervical cancer screening.    Plan:   1. Continue any current medications as  needed.  Patient asked for a refill of her nausea medicine as she has not had a chance to see her neurologist who was the one that normally prescribed for her.  Will give a one-time refill.  However I also discussed with her that chronic use of Zofran can be a potential worsening factor for her constipation. 2. Wound care discussed.  Overall healing well. 3. Operative findings again reviewed. Pathology report discussed.  Also no endometriosis advised that we can still treat her as such and see if there is a change noted with current hormonal contraception Xulane.  Also discussed some of the adhesive disease noted on her bowels.  Patient denies ever having any abdominal surgeries.  Discussed that adhesions can also be from infections (GI or pelvic).  After further discussion finally admits to a history of being diagnosed with chlamydia last year for which she received treatment.  Also for patient that she may notice some improvement in her bowel habits from having the adhesions we will also refer patient to GI as she has reported a history of chronic constipation, persistent nausea, and also findings of her enlarged gallbladder on surgical procedure. 4. Activity restrictions: none 5. Anticipated return to work: now, if applicable. 6.  Patient is overdue for, thinks that she has had 1 by her PCP however review of care everywhere does not note any Pap smear results.  Patient has been difficult to get in for annual exam follow-up appointments in the past, will proceed with performing pap smear today, also with GC/CT testing per age based screening guidelines and prior history. Follow up: if symptoms persist or  fail to improve.     [name], MD [facility]

## 2018-12-25 ENCOUNTER — Ambulatory Visit (INDEPENDENT_AMBULATORY_CARE_PROVIDER_SITE_OTHER): Payer: PRIVATE HEALTH INSURANCE | Admitting: Obstetrics and Gynecology

## 2018-12-25 ENCOUNTER — Telehealth: Payer: Self-pay | Admitting: Obstetrics and Gynecology

## 2018-12-25 ENCOUNTER — Other Ambulatory Visit: Payer: Self-pay

## 2018-12-25 ENCOUNTER — Telehealth: Payer: Self-pay

## 2018-12-25 ENCOUNTER — Encounter: Payer: Self-pay | Admitting: Obstetrics and Gynecology

## 2018-12-25 VITALS — BP 123/76 | HR 79 | Ht 67.0 in | Wt 159.0 lb

## 2018-12-25 DIAGNOSIS — N939 Abnormal uterine and vaginal bleeding, unspecified: Secondary | ICD-10-CM

## 2018-12-25 DIAGNOSIS — R42 Dizziness and giddiness: Secondary | ICD-10-CM

## 2018-12-25 LAB — HEMOGLOBIN AND HEMATOCRIT, BLOOD
Hematocrit: 42.4 % (ref 34.0–46.6)
Hemoglobin: 13.7 g/dL (ref 11.1–15.9)

## 2018-12-25 NOTE — Progress Notes (Signed)
Please inform patient that her blood count is still normal (s 13.7 (unchanged from 2 weeks ago) despite her bleeding.  She is not anemic currently.

## 2018-12-25 NOTE — Telephone Encounter (Signed)
The patients mother called and stated the patient had surgery with Marcelline Mates on 9/28. Pt is experiencing abnormal bleeding and it is effecting the patients mobility. The pts mother stated the patient has not been able to get in touch with Marcelline Mates, or her nurse in regards to issue and concerns. Pts mother stated pt is going to [place] for urgent issue and should be seeing Dr. Marcelline Mates instead. Pts mother was speaking about different topics/subjects at once difficult to understand what was needed. Please advise.

## 2018-12-25 NOTE — Telephone Encounter (Signed)
Please see another phone encounter.  

## 2018-12-25 NOTE — Telephone Encounter (Signed)
Spoke with pt and went over information given by [facility] which was the information [facility] gave pt at her pre-op visit which was that since she was stopping one form and birth control and starting another that she would notice some changes in her cycle and possible heavy bleeding. Pt has an appointment at 11am to see [facility].

## 2018-12-25 NOTE — Progress Notes (Signed)
Pt stated that her cycle is heavy noticing heavy bleeding that is making her weak, dizzy and cramping. Pt stated changing her pad and tampon every hour pt stated that it has increased since yesterday after talking to nurse.

## 2018-12-25 NOTE — Telephone Encounter (Signed)
Patient called stating she has not heard back from previous message. She stated she is still bleeding and would like a call back.Thanks

## 2018-12-25 NOTE — Patient Instructions (Signed)
Dysfunctional Uterine Bleeding Dysfunctional uterine bleeding is abnormal bleeding from the uterus. Dysfunctional uterine bleeding includes:  A menstrual period that comes earlier or later than usual.  A menstrual period that is lighter or heavier than usual, or has large blood clots.  Vaginal bleeding between menstrual periods.  Skipping one or more menstrual periods.  Vaginal bleeding after sex.  Vaginal bleeding after menopause. Follow these instructions at home: Eating and drinking   Eat well-balanced meals. Include foods that are high in iron, such as liver, meat, shellfish, green leafy vegetables, and eggs.  To prevent or treat constipation, your health care provider may recommend that you: ? Drink enough fluid to keep your urine pale yellow. ? Take over-the-counter or prescription medicines. ? Eat foods that are high in fiber, such as beans, whole grains, and fresh fruits and vegetables. ? Limit foods that are high in fat and processed sugars, such as fried or sweet foods. Medicines  Take over-the-counter and prescription medicines only as told by your health care provider.  Do not change medicines without talking with your health care provider.  Aspirin or medicines that contain aspirin may make the bleeding worse. Do not take those medicines: ? During the week before your menstrual period. ? During your menstrual period.  If you were prescribed iron pills, take them as told by your health care provider. Iron pills help to replace iron that your body loses because of this condition. Activity  If you need to change your sanitary pad or tampon more than one time every 2 hours: ? Lie in bed with your feet raised (elevated). ? Place a cold pack on your lower abdomen. ? Rest as much as possible until the bleeding stops or slows down.  Do not try to lose weight until the bleeding has stopped and your blood iron level is back to normal. General instructions   For two  months, write down: ? When your menstrual period starts. ? When your menstrual period ends. ? When any abnormal vaginal bleeding occurs. ? What problems you notice.  Keep all follow up visits as told by your health care provider. This is important. Contact a health care provider if you:  Feel light-headed or weak.  Have nausea and vomiting.  Cannot eat or drink without vomiting.  Feel dizzy or have diarrhea while you are taking medicines.  Are taking birth control pills or hormones, and you want to change them or stop taking them. Get help right away if:  You develop a fever or chills.  You need to change your sanitary pad or tampon more than one time per hour.  Your vaginal bleeding becomes heavier, or your flow contains clots more often.  You develop pain in your abdomen.  You lose consciousness.  You develop a rash. Summary  Dysfunctional uterine bleeding is abnormal bleeding from the uterus.  It includes menstrual bleeding of abnormal duration, volume, or regularity.  Bleeding after sex and after menopause are also considered dysfunctional uterine bleeding. This information is not intended to replace advice given to you by your health care provider. Make sure you discuss any questions you have with your health care provider. Document Released: 02/26/2000 Document Revised: 08/09/2017 Document Reviewed: 08/09/2017 Elsevier Patient Education  2020 [employer]  

## 2018-12-25 NOTE — Telephone Encounter (Signed)
Pts mom Sandra Rivera aware that Sandra Rivera an Sandra Rivera have has a conversation about what Sandra Rivera needs to do.   The will call her when they get a break with pts this am. Pts mom states Tamaras BF is taking her to [place].

## 2018-12-26 ENCOUNTER — Encounter: Payer: Self-pay | Admitting: Obstetrics and Gynecology

## 2018-12-26 NOTE — Progress Notes (Signed)
    GYNECOLOGY PROGRESS NOTE  Subjective:    Patient ID: Sandra Rivera, female    DOB: [birth year], 23 y.o.   MRN: [id number]  HPI  Patient is a 23 y.o. G27P1001 female who presents for complaints of heavy bleeding with her cycle for the past 2-3 days.  She reports that she is having to change a pad/tampon every 1- 2 hours. She reports that she is also having cramping, and feeling dizzy with pelvic cramping. She is currently using Xulane for contraception, recently changed from [place] 2 weeks ago (during recent surgery had the rod removed).   Of note, patient recently had a diagnostic laparoscopy (2 weeks ago) for complaints of heavy irregular bleeding, pelvic pain, dysmenorrhea, however she was negative for endometriosis on biopsy. Reported some bleeding after this surgery but was lighter.   The following portions of the patient's history were reviewed and updated as appropriate: allergies, current medications, past family history, past medical history, past social history, past surgical history and problem list.  Review of Systems Pertinent items noted in HPI and remainder of comprehensive ROS otherwise negative.   Objective:   Blood pressure 123/76, pulse 79, height 5\' 7"  (1.702 m), weight 159 lb (72.1 kg), last menstrual period 12/13/2018. General appearance: alert and no distress Abdomen: soft, non-tender; bowel sounds normal; no masses,  no organomegaly Pelvic: external genitalia normal, rectovaginal septum normal.  Vagina with small amount of dark red blood in vaginal vault.  Cervix normal appearing, no lesions and no motion tenderness.  Uterus mobile, nontender, normal shape and size.  Adnexae non-palpable, nontender bilaterally.    CBC Latest Ref Rng & Units 12/06/2018 09/06/2018  WBC 4.0 - 10.5 K/uL 5.4 5.7  Hemoglobin 11.1 - 15.9 g/dL 13.7 13.7  Hematocrit 34.0 - 46.6 % 41.0 41.6  Platelets 150 - 400 K/uL 203 208    Assessment:   Menorrhagia  Plan:   - Reiterated to  patient that her cycle may be irregular due to changing of her contraception mid-cycle as well as her recent surgery.  Will also check Hgb to assess for anemia. Advised on increasing hydration, resting for today. Can take a daily iron tablet. To continue Xulane. Also given a sample of Lo-Loestrin to take for 1 week.  - During discussion of bleeding, patient also notes that she has a history of easy bruising. Notes she has never been worked up. Will also check bleeding profile.  - To return if symptoms worsen or do not improve.    [name], MD [facility]

## 2018-12-27 LAB — VON WILLEBRAND PANEL
Factor VIII Activity: 124 % (ref 56–140)
Von Willebrand Ag: 169 % (ref 50–200)
Von Willebrand Factor: 168 % (ref 50–200)

## 2018-12-27 LAB — PROTIME-INR
INR: 1 (ref 0.9–1.2)
Prothrombin Time: 10.3 s (ref 9.1–12.0)

## 2018-12-27 LAB — COAG STUDIES INTERP REPORT

## 2018-12-27 LAB — TSH: TSH: 1.1 u[IU]/mL (ref 0.450–4.500)

## 2018-12-27 LAB — APTT: aPTT: 25 s (ref 24–33)

## 2018-12-28 ENCOUNTER — Ambulatory Visit (INDEPENDENT_AMBULATORY_CARE_PROVIDER_SITE_OTHER): Payer: PRIVATE HEALTH INSURANCE | Admitting: Surgery

## 2018-12-28 ENCOUNTER — Other Ambulatory Visit: Payer: Self-pay

## 2018-12-28 ENCOUNTER — Encounter: Payer: Self-pay | Admitting: Surgery

## 2018-12-28 DIAGNOSIS — R109 Unspecified abdominal pain: Secondary | ICD-10-CM
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
## 2018-12-28 NOTE — Patient Instructions (Addendum)
Patient may continue to take over the counter Miralax twice a day and to try Dulcolax once a day to help with constipation. Dr.Piscoya recommend patient to take over the counter Prilosec once a day to help with heartburn.    Patient is [facility]19 at 10:00 am. [id number], [place], [place] [address]. Patient is not to eat anything six hours prior to exam.   Patient will have follow up appointment with Dr. Hampton Abbot on 01/17/19 at 10:00 am.

## 2018-12-31 LAB — CYTOLOGY - PAP
Chlamydia: NEGATIVE
Comment: NEGATIVE
Comment: NORMAL
Diagnosis: NEGATIVE
Neisseria Gonorrhea: NEGATIVE

## 2019-01-02 NOTE — Progress Notes (Signed)
Please see if she is taking the pills as prescribed in addition to her patches.  If no relief from her bleeding, she may  need to stop the patches at the end of her 3 weeks and do an OCP taper.

## 2019-01-03 ENCOUNTER — Other Ambulatory Visit: Payer: Self-pay

## 2019-01-03 ENCOUNTER — Ambulatory Visit
Admission: RE | Admit: 2019-01-03 | Discharge: 2019-01-03 | Disposition: A | Discharge status: Home / Self Care | Payer: PRIVATE HEALTH INSURANCE | Source: Ambulatory Visit | Attending: Surgery | Admitting: Surgery

## 2019-01-03 DIAGNOSIS — R109 Unspecified abdominal pain: Secondary | ICD-10-CM

## 2019-01-07 ENCOUNTER — Ambulatory Visit: Admission: RE | Admit: 2019-01-07 | Payer: PRIVATE HEALTH INSURANCE | Source: Ambulatory Visit

## 2019-01-10 ENCOUNTER — Ambulatory Visit
Admission: RE | Admit: 2019-01-10 | Discharge: 2019-01-10 | Disposition: A | Discharge status: Home / Self Care | Payer: PRIVATE HEALTH INSURANCE | Source: Ambulatory Visit | Attending: Surgery | Admitting: Surgery

## 2019-01-10 ENCOUNTER — Other Ambulatory Visit: Payer: Self-pay

## 2019-01-10 ENCOUNTER — Telehealth: Payer: Self-pay

## 2019-01-10 DIAGNOSIS — R109 Unspecified abdominal pain: Secondary | ICD-10-CM | POA: Insufficient documentation

## 2019-01-10 DIAGNOSIS — K8020 Calculus of gallbladder without cholecystitis without obstruction: Secondary | ICD-10-CM

## 2019-01-10 NOTE — Telephone Encounter (Signed)
Per Dr.Piscoya patient is scheduled for HIDA scan with EF on 01/23/19 at [facility] arrival time at 8:00am and procedure will start at 8:30am. Patient was notified of her negative ultrasound results per Dr.Piscoya. Patient will follow up with Dr.Piscoya 01/23/19 at 11:00am to discuss scan results. Patient verbalizes understanding.

## 2019-01-10 NOTE — Telephone Encounter (Signed)
-----   Message from Olean Ree, MD sent at 01/10/2019 10:44 AM EDT ----- Regarding: new HIDA order Hi Sandra Rivera,  We saw this patient together for her abdominal pain.  U/S was just done and was negative.  We had talked to her in the office that if the U/S was negative, we would get a HIDA scan to evaluate her gallbladder function.  Can you please order a HIDA scan with EF for her?  Depening on when it's able to be scheduled, we may have to move her follow up appointment with me, which is on 11/6.  Thanks!  [employer]

## 2019-01-18 ENCOUNTER — Ambulatory Visit: Payer: PRIVATE HEALTH INSURANCE | Admitting: Surgery

## 2019-01-23 ENCOUNTER — Encounter
Admission: RE | Admit: 2019-01-23 | Discharge: 2019-01-23 | Disposition: A | Discharge status: Home / Self Care | Payer: PRIVATE HEALTH INSURANCE | Source: Ambulatory Visit | Attending: Surgery | Admitting: Surgery

## 2019-01-23 ENCOUNTER — Ambulatory Visit: Payer: Self-pay | Admitting: Surgery

## 2019-01-23 ENCOUNTER — Other Ambulatory Visit: Payer: Self-pay

## 2019-01-23 DIAGNOSIS — K8020 Calculus of gallbladder without cholecystitis without obstruction: Secondary | ICD-10-CM

## 2019-01-23 MED ORDER — TECHNETIUM TC 99M MEBROFENIN IV KIT
5.2440 | PACK | Freq: Once | INTRAVENOUS | Status: AC | PRN
  Administered 2019-01-23: 5.244 via INTRAVENOUS

## 2019-01-25 ENCOUNTER — Ambulatory Visit: Payer: Self-pay | Admitting: Surgery

## 2019-01-28 ENCOUNTER — Other Ambulatory Visit: Payer: Self-pay

## 2019-01-28 DIAGNOSIS — K219 Gastro-esophageal reflux disease without esophagitis: Secondary | ICD-10-CM

## 2019-01-28 NOTE — Progress Notes (Signed)
Referral to GI sent at this time.

## 2019-02-01 ENCOUNTER — Encounter: Payer: Self-pay | Admitting: *Deleted

## 2019-02-04 ENCOUNTER — Telehealth: Payer: Self-pay

## 2019-02-04 NOTE — Telephone Encounter (Signed)
GI referral was sent over by Glorianne Manchester on 01/28/19.

## 2019-02-04 NOTE — Telephone Encounter (Signed)
-----   Message from Olean Ree, MD sent at 01/25/2019  9:55 AM EST ----- Regarding: referral Hi Sandra Rivera,  I spoke with [name] this morning and discussed her U/S and HIDA scan results with her.  Can you please order a referral to Gastroenterology for heartburn and constipation?  Thanks!  [employer]

## 2019-02-19 ENCOUNTER — Telehealth: Payer: Self-pay | Admitting: Obstetrics and Gynecology

## 2019-02-19 NOTE — Telephone Encounter (Signed)
Pt called in she took a at home preg. Test she has appt this thur. Pt is consider about meds she is on. Pt is requesting a call from the nurse. Please advise

## 2019-02-19 NOTE — Telephone Encounter (Signed)
Pt called to speak more about her call to the office. Pt stated that she took 2 pregnant test and both of them were positive. Pt stated that she has not skipped any of her birth control patches and her last cycle was about 3 weeks ago. Pt has an appointment on 02/21/19.

## 2019-02-21 ENCOUNTER — Other Ambulatory Visit: Payer: Self-pay

## 2019-02-21 ENCOUNTER — Encounter: Payer: Self-pay | Admitting: Obstetrics and Gynecology

## 2019-02-21 ENCOUNTER — Ambulatory Visit (INDEPENDENT_AMBULATORY_CARE_PROVIDER_SITE_OTHER): Payer: PRIVATE HEALTH INSURANCE | Admitting: Obstetrics and Gynecology

## 2019-02-21 VITALS — BP 113/75 | HR 99 | Ht 67.0 in | Wt 156.4 lb

## 2019-02-21 DIAGNOSIS — Z3687 Encounter for antenatal screening for uncertain dates: Secondary | ICD-10-CM

## 2019-02-21 DIAGNOSIS — O09891 Supervision of other high risk pregnancies, first trimester: Secondary | ICD-10-CM | POA: Diagnosis not present

## 2019-02-21 DIAGNOSIS — F419 Anxiety disorder, unspecified: Secondary | ICD-10-CM | POA: Diagnosis not present

## 2019-02-21 DIAGNOSIS — N926 Irregular menstruation, unspecified: Secondary | ICD-10-CM

## 2019-02-21 DIAGNOSIS — G43009 Migraine without aura, not intractable, without status migrainosus: Secondary | ICD-10-CM

## 2019-02-21 DIAGNOSIS — F329 Major depressive disorder, single episode, unspecified: Secondary | ICD-10-CM

## 2019-02-21 LAB — POCT URINE PREGNANCY: Preg Test, Ur: POSITIVE — AB

## 2019-02-21 NOTE — Patient Instructions (Signed)
First Trimester of Pregnancy ° °The first trimester of pregnancy is from week 1 until the end of week 13 (months 1 through 3). During this time, your baby will begin to develop inside you. At 6-8 weeks, the eyes and face are formed, and the heartbeat can be seen on ultrasound. At the end of 12 weeks, all the baby's organs are formed. Prenatal care is all the medical care you receive before the birth of your baby. Make sure you get good prenatal care and follow all of your doctor's instructions. °Follow these instructions at home: °Medicines °· Take over-the-counter and prescription medicines only as told by your doctor. Some medicines are safe and some medicines are not safe during pregnancy. °· Take a prenatal vitamin that contains at least 600 micrograms (mcg) of folic acid. °· If you have trouble pooping (constipation), take medicine that will make your stool soft (stool softener) if your doctor approves. °Eating and drinking ° °· Eat regular, healthy meals. °· Your doctor will tell you the amount of weight gain that is right for you. °· Avoid raw meat and uncooked cheese. °· If you feel sick to your stomach (nauseous) or throw up (vomit): °? Eat 4 or 5 small meals a day instead of 3 large meals. °? Try eating a few soda crackers. °? Drink liquids between meals instead of during meals. °· To prevent constipation: °? Eat foods that are high in fiber, like fresh fruits and vegetables, whole grains, and beans. °? Drink enough fluids to keep your pee (urine) clear or pale yellow. °Activity °· Exercise only as told by your doctor. Stop exercising if you have cramps or pain in your lower belly (abdomen) or low back. °· Do not exercise if it is too hot, too humid, or if you are in a place of great height (high altitude). °· Try to avoid standing for long periods of time. Move your legs often if you must stand in one place for a long time. °· Avoid heavy lifting. °· Wear low-heeled shoes. Sit and stand up  straight. °· You can have sex unless your doctor tells you not to. °Relieving pain and discomfort °· Wear a good support bra if your breasts are sore. °· Take warm water baths (sitz baths) to soothe pain or discomfort caused by hemorrhoids. Use hemorrhoid cream if your doctor says it is okay. °· Rest with your legs raised if you have leg cramps or low back pain. °· If you have puffy, bulging veins (varicose veins) in your legs: °? Wear support hose or compression stockings as told by your doctor. °? Raise (elevate) your feet for 15 minutes, 3-4 times a day. °? Limit salt in your food. °Prenatal care °· Schedule your prenatal visits by the twelfth week of pregnancy. °· Write down your questions. Take them to your prenatal visits. °· Keep all your prenatal visits as told by your doctor. This is important. °Safety °· Wear your seat belt at all times when driving. °· Make a list of emergency phone numbers. The list should include numbers for family, friends, the hospital, and police and fire departments. °General instructions °· Ask your doctor for a referral to a local prenatal class. Begin classes no later than at the start of month 6 of your pregnancy. °· Ask for help if you need counseling or if you need help with nutrition. Your doctor can give you advice or tell you where to go for help. °· Do not use hot tubs, steam   rooms, or saunas. °· Do not douche or use tampons or scented sanitary pads. °· Do not cross your legs for long periods of time. °· Avoid all herbs and alcohol. Avoid drugs that are not approved by your doctor. °· Do not use any tobacco products, including cigarettes, chewing tobacco, and electronic cigarettes. If you need help quitting, ask your doctor. You may get counseling or other support to help you quit. °· Avoid cat litter boxes and soil used by cats. These carry germs that can cause birth defects in the baby and can cause a loss of your baby (miscarriage) or stillbirth. °· Visit your dentist.  At home, brush your teeth with a soft toothbrush. Be gentle when you floss. °Contact a doctor if: °· You are dizzy. °· You have mild cramps or pressure in your lower belly. °· You have a nagging pain in your belly area. °· You continue to feel sick to your stomach, you throw up, or you have watery poop (diarrhea). °· You have a bad smelling fluid coming from your vagina. °· You have pain when you pee (urinate). °· You have increased puffiness (swelling) in your face, hands, legs, or ankles. °Get help right away if: °· You have a fever. °· You are leaking fluid from your vagina. °· You have spotting or bleeding from your vagina. °· You have very bad belly cramping or pain. °· You gain or lose weight rapidly. °· You throw up blood. It may look like coffee grounds. °· You are around people who have German measles, fifth disease, or chickenpox. °· You have a very bad headache. °· You have shortness of breath. °· You have any kind of trauma, such as from a fall or a car accident. °Summary °· The first trimester of pregnancy is from week 1 until the end of week 13 (months 1 through 3). °· To take care of yourself and your unborn baby, you will need to eat healthy meals, take medicines only if your doctor tells you to do so, and do activities that are safe for you and your baby. °· Keep all follow-up visits as told by your doctor. This is important as your doctor will have to ensure that your baby is healthy and growing well. °This information is not intended to replace advice given to you by your health care provider. Make sure you discuss any questions you have with your health care provider. °Document Released: 08/17/2007 Document Revised: 06/21/2018 Document Reviewed: 03/08/2016 °Elsevier Patient Education © 2020 [employer] Inc[employer] ° °

## 2019-02-21 NOTE — Progress Notes (Signed)
GYNECOLOGY CLINIC PROGRESS NOTE Subjective:    Sandra Rivera is a 23 y.o. G53P1001 female who presents for evaluation of amenorrhea. She believes she could be pregnant. Pregnancy is unplanned, but she desires to keep the pregnancy Sexual Activity: single partner, contraception: OCP (estrogen/progesterone) and Ortho-Evra patches weekly. Current symptoms also include: morning sickness, nausea and positive home pregnancy test. Last period was unknown as she has irregular menstrual bleeding on her birth control.    The following portions of the patient's history were reviewed and updated as appropriate: allergies, current medications, past family history, past medical history, past social history, past surgical history and problem list.  Current Outpatient Medications on File Prior to Visit  Medication Sig Dispense Refill  . ALPRAZolam (XANAX) 0.25 MG tablet Take 1 tablet (0.25 mg total) by mouth 2 (two) times daily as needed for anxiety. 30 tablet 0  . busPIRone (BUSPAR) 5 MG tablet Take 5 mg by mouth 2 (two) times daily.    . butalbital-acetaminophen-caffeine (FIORICET, ESGIC) 50-325-40 MG tablet Take 1 tablet by mouth every 6 (six) hours as needed for headache or migraine.     . citalopram (CELEXA) 10 MG tablet Take 10 mg by mouth daily.    . Fremanezumab-vfrm (AJOVY) 225 MG/1.5ML SOSY Inject 225 mg into the skin every 30 (thirty) days.    Marland Kitchen gabapentin (NEURONTIN) 300 MG capsule Take 300 mg by mouth at bedtime.     Marland Kitchen ibuprofen (ADVIL) 800 MG tablet Take 1 tablet (800 mg total) by mouth every 8 (eight) hours as needed for mild pain or moderate pain. 60 tablet 1  . norelgestromin-ethinyl estradiol [employer]ico) 150-35 MCG/24HR transdermal patch Place 1 patch onto the skin once a week. 3 patch 12  . ondansetron (ZOFRAN-ODT) 4 MG disintegrating tablet Take 1 tablet (4 mg total) by mouth every 8 (eight) hours as needed for nausea or vomiting. 30 tablet 0  . phentermine 30 MG capsule Take 30 mg by mouth  every morning.    . SUMAtriptan (IMITREX) 100 MG tablet Take 100 mg by mouth every 2 (two) hours as needed for migraine or headache.      No current facility-administered medications on file prior to visit.    Review of Systems Pertinent items noted in HPI and remainder of comprehensive ROS otherwise negative.     Objective:    BP 113/75   Pulse 99   Ht 5\' 7"  (1.702 m)   Wt 156 lb 6.4 oz (70.9 kg)   LMP  (Within Weeks)   BMI 24.50 kg/m  General: alert, no distress and no acute distress    Lab Review Urine HCG: positive    Assessment:   Absence of menstruation.   Unsure LMP Multiple medication use in first trimester      Anxiety and depression Migraines   Plan:   - Pregnancy Test: Positive: EDC: unknown due to unknown LMP. Briefly discussed pre-natal care options.  Encouraged well-balanced diet, plenty of rest when needed, pre-natal vitamins daily and walking for exercise. Discussed self-help for nausea, avoiding OTC medications until consulting provider or pharmacist, other than Tylenol as needed, minimal caffeine (1-2 cups daily) and avoiding alcohol.  As patient is unsure of her dates, will order hCG level to assess where she may be in the pregnancy, could be anywhere between 4 to [redacted] weeks pregnant.  Based on patient review level, can schedule dating and viability ultrasound accordingly. -Discussed the need for cessation of several of the medications the patient is currently  taking, including Imitrex, ibuprofen, Xanax, Ajovy, gabapentin, and phentermine.  Already discontinued the birth control at discovery of pregnancy.  Patient is okay to continue BuSpar and Celexa for history of depression and anxiety.  These medications are considered Category C medications.  Discussed risk versus benefits of continued use versus cessation of medications.  Patient currently stable on her meds, so will not discontinue at this time.  -For migraines, patient can continue taking the Fioricet,  however she discontinued Imitrex at this time.  Discussed that migraines can have the potential to stay the same, get better, or get worse during pregnancy.  Will follow symptoms, and if referral back to neurology is needed can do so.  [name], MD [facility]

## 2019-02-21 NOTE — Progress Notes (Signed)
Pt stated taking several home pregancy test that were positive. UPT- Positive. Pt stated that she was using her birth control as prescribed. Pt unsure of LMP but stated that it was 3 weeks ago.

## 2019-02-22 LAB — BETA HCG QUANT (REF LAB): hCG Quant: 1285 m[IU]/mL

## 2019-02-25 ENCOUNTER — Telehealth: Payer: Self-pay

## 2019-02-25 NOTE — Telephone Encounter (Signed)
Pt called in wanting to her results for her blood work test. Pt is very upset. Pt is requesting a call back. Please advise

## 2019-02-25 NOTE — Telephone Encounter (Signed)
I spoke with the patient and let her know that Dr. Marcelline Mates would be in the office tomorrow and will review the labs. I explained that it would be tomorrow afternoon before someone would call her back due to seeing patients in the clinic.

## 2019-02-25 NOTE — Telephone Encounter (Signed)
Pt called for pregnancy lab results to know how far along she is. Please call pt.

## 2019-02-26 NOTE — Telephone Encounter (Signed)
Patient is calling to find out abt her results from blood test

## 2019-02-27 NOTE — Telephone Encounter (Signed)
Pt is aware of test results.  

## 2019-03-02 ENCOUNTER — Other Ambulatory Visit: Payer: Self-pay | Admitting: Obstetrics and Gynecology

## 2019-03-05 ENCOUNTER — Ambulatory Visit (INDEPENDENT_AMBULATORY_CARE_PROVIDER_SITE_OTHER): Payer: PRIVATE HEALTH INSURANCE

## 2019-03-05 ENCOUNTER — Other Ambulatory Visit: Payer: Self-pay

## 2019-03-05 ENCOUNTER — Other Ambulatory Visit: Payer: Self-pay | Admitting: Obstetrics and Gynecology

## 2019-03-05 DIAGNOSIS — Z3A01 Less than 8 weeks gestation of pregnancy: Secondary | ICD-10-CM

## 2019-03-05 DIAGNOSIS — Z789 Other specified health status: Secondary | ICD-10-CM

## 2019-03-05 DIAGNOSIS — N8311 Corpus luteum cyst of right ovary: Secondary | ICD-10-CM | POA: Diagnosis not present

## 2019-03-05 DIAGNOSIS — O3481 Maternal care for other abnormalities of pelvic organs, first trimester: Secondary | ICD-10-CM | POA: Diagnosis not present

## 2019-03-15 NOTE — L&D Delivery Note (Signed)
Delivery Summary for Sandra Rivera  Labor Events:   Preterm labor: No data found  Rupture date: 10/18/2019  Rupture time: 7:31 AM  Rupture type: Artificial  Fluid Color: Clear  Induction: No data found  Augmentation: No data found  Complications: No data found  Cervical ripening: No data found No data found   No data found     Delivery:   Episiotomy: No data found  Lacerations: No data found  Repair suture: No data found  Repair # of packets: No data found  Blood loss (ml): 325    Information for the patient's newborn:  Katryna, Tschirhart [[id number]]    Delivery 10/18/2019 10:12 AM by  Vaginal, Spontaneous Sex:  female Gestational Age: [redacted]w[redacted]d Delivery Clinician:   Living?:         APGARS  One minute Five minutes Ten minutes  Skin color:        Heart rate:        Grimace:        Muscle tone:        Breathing:        Totals: 8  9      Presentation/position:      Resuscitation:   Cord information:    Disposition of cord blood:     Blood gases sent?  Complications:   Placenta: Delivered:       appearance Newborn Measurements: Weight: 6 lb 7 oz (2920 g)  Height: 18.5"  Head circumference:    Chest circumference:    Other providers:    Additional  information: Forceps:   Vacuum:   Breech:   Observed anomalies        Delivery Note At 10:12 AM a viable and healthy female was delivered via Vaginal, Spontaneous (Presentation: Vertex; Left Occiput Anterior).  APGAR: 8, 9; weight 6 lb 7 oz (2920 g).   Placenta status: Spontaneous, Intact.  Cord: 3 vessels with the following complications: None.  Cord pH: not obtained.  Delayed cord clamping observed. Cord blood collected for Rh negative blood type.   Anesthesia: None Episiotomy: None Lacerations: None Suture Repair: None Est. Blood Loss (mL): 325   Mom to postpartum.  Baby to Couplet care / Skin to Skin.  Hildred Laser, MD 10/18/2019, 12:59 PM

## 2019-03-21 ENCOUNTER — Other Ambulatory Visit: Payer: Self-pay

## 2019-03-21 ENCOUNTER — Ambulatory Visit (INDEPENDENT_AMBULATORY_CARE_PROVIDER_SITE_OTHER): Payer: PRIVATE HEALTH INSURANCE | Admitting: Obstetrics and Gynecology

## 2019-03-21 VITALS — BP 123/76 | HR 99 | Ht 67.0 in | Wt 161.2 lb

## 2019-03-21 DIAGNOSIS — Z3481 Encounter for supervision of other normal pregnancy, first trimester: Secondary | ICD-10-CM

## 2019-03-21 LAB — OB RESULTS CONSOLE VARICELLA ZOSTER ANTIBODY, IGG: Varicella: NON-IMMUNE/NOT IMMUNE

## 2019-03-21 LAB — OB RESULTS CONSOLE GC/CHLAMYDIA: Gonorrhea: NEGATIVE

## 2019-03-21 NOTE — Progress Notes (Signed)
Sandra Rivera presents for NOB nurse interview visit. Pregnancy confirmation done 02/21/19 Dr Valentino Saxon.  G-2 .  P- 1   . Pregnancy education material explained and given.0 cats in the home. NOB labs ordered.  HIV labs and Drug screen were explained optional and she declined. Drug screen declined. PNV encouraged. Genetic screening options discussed. Genetic testing: Patient will return 04/15/19 for blood draw. Pt. To follow up with provider in 4 weeks for NOB physical.  All questions answered. Neither patient nor partner will be using FMLA. Financial responsibility paper reviewed.

## 2019-03-22 ENCOUNTER — Encounter: Payer: Self-pay | Admitting: Obstetrics and Gynecology

## 2019-03-22 LAB — MICROSCOPIC EXAMINATION
Casts: NONE SEEN /lpf
Epithelial Cells (non renal): >10 /hpf — AB (ref 0–10)
RBC, Urine: NONE SEEN /hpf (ref 0–2)

## 2019-03-22 LAB — GC/CHLAMYDIA PROBE AMP
Chlamydia trachomatis, NAA: NEGATIVE
Neisseria Gonorrhoeae by PCR: NEGATIVE

## 2019-03-22 LAB — URINALYSIS, ROUTINE W REFLEX MICROSCOPIC
Bilirubin, UA: NEGATIVE
Glucose, UA: NEGATIVE
Nitrite, UA: NEGATIVE
Protein,UA: NEGATIVE
RBC, UA: NEGATIVE
Specific Gravity, UA: 1.026 (ref 1.005–1.030)
Urobilinogen, Ur: 1 mg/dL (ref 0.2–1.0)
pH, UA: 7 (ref 5.0–7.5)

## 2019-03-23 LAB — URINE CULTURE

## 2019-03-23 LAB — RUBELLA SCREEN: Rubella Antibodies, IGG: 1 index (ref >0.99)

## 2019-03-23 LAB — VARICELLA ZOSTER ANTIBODY, IGG: Varicella zoster IgG: <135 index — ABNORMAL LOW (ref >165)

## 2019-03-23 LAB — ABO AND RH: Rh Factor: NEGATIVE

## 2019-03-23 LAB — ANTIBODY SCREEN: Antibody Screen: NEGATIVE

## 2019-03-23 LAB — RPR: RPR Ser Ql: NONREACTIVE

## 2019-03-23 LAB — HEPATITIS B SURFACE ANTIGEN: Hepatitis B Surface Ag: NEGATIVE

## 2019-04-12 ENCOUNTER — Telehealth: Payer: Self-pay | Admitting: Obstetrics and Gynecology

## 2019-04-12 NOTE — Telephone Encounter (Signed)
Pt called in and stated that she is having lower pelvic pain. I called back and talked to dr cherry and let her know that the pt is having pelvic pain. Dr Valentino Saxon stated is the pt  Bleeding,  pt isn't bleeding dr cherry advised the pt to take some tylenol for the pain and to watch for bleeding.   The pt stated that she is taking tylenol and that the pain is still there.  Pt is requesting a call back from the nurse. Please advise

## 2019-04-12 NOTE — Telephone Encounter (Signed)
Pt stated that she is having lower pelvic pain and stated that she was taking tylenol 500 mg and is not helping her. Pt stated that she is unable to have a BM. Pt stated that she feels full all the time even from drinking water. Pt stated that it is the same pain she was having before her surgery it was nothing new. Pt was given a list of medication verbal for her take to help with the constipation. Pt stated that she would try one of them to see if it would help.

## 2019-04-15 NOTE — Progress Notes (Signed)
Pt present for NOB-PE-Patient c/o "going through a lot of issues at home".  Patient will like to discuss starting medication.

## 2019-04-16 ENCOUNTER — Other Ambulatory Visit: Payer: Self-pay

## 2019-04-16 ENCOUNTER — Encounter: Payer: Self-pay | Admitting: Obstetrics and Gynecology

## 2019-04-16 ENCOUNTER — Ambulatory Visit (INDEPENDENT_AMBULATORY_CARE_PROVIDER_SITE_OTHER): Payer: PRIVATE HEALTH INSURANCE | Admitting: Obstetrics and Gynecology

## 2019-04-16 VITALS — BP 96/67 | HR 98 | Ht 67.0 in | Wt 163.0 lb

## 2019-04-16 DIAGNOSIS — Z3401 Encounter for supervision of normal first pregnancy, first trimester: Secondary | ICD-10-CM | POA: Diagnosis not present

## 2019-04-16 DIAGNOSIS — F419 Anxiety disorder, unspecified: Secondary | ICD-10-CM

## 2019-04-16 DIAGNOSIS — F329 Major depressive disorder, single episode, unspecified: Secondary | ICD-10-CM

## 2019-04-16 DIAGNOSIS — O26899 Other specified pregnancy related conditions, unspecified trimester: Secondary | ICD-10-CM

## 2019-04-16 DIAGNOSIS — Z1379 Encounter for other screening for genetic and chromosomal anomalies: Secondary | ICD-10-CM

## 2019-04-16 DIAGNOSIS — Z6791 Unspecified blood type, Rh negative: Secondary | ICD-10-CM

## 2019-04-16 DIAGNOSIS — Z8669 Personal history of other diseases of the nervous system and sense organs: Secondary | ICD-10-CM
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
## 2019-04-16 NOTE — Patient Instructions (Addendum)
You can resume Celexa 10 mg daily.  You can resume Buspar 5 mg daily.      Second Trimester of Pregnancy The second trimester is from week 14 through week 27 (months 4 through 6). The second trimester is often a time when you feel your best. Your body has adjusted to being pregnant, and you begin to feel better physically. Usually, morning sickness has lessened or quit completely, you may have more energy, and you may have an increase in appetite. The second trimester is also a time when the fetus is growing rapidly. At the end of the sixth month, the fetus is about 9 inches long and weighs about 1 pounds. You will likely begin to feel the baby move (quickening) between 16 and 20 weeks of pregnancy. Body changes during your second trimester Your body continues to go through many changes during your second trimester. The changes vary from woman to woman.  Your weight will continue to increase. You will notice your lower abdomen bulging out.  You may begin to get stretch marks on your hips, abdomen, and breasts.  You may develop headaches that can be relieved by medicines. The medicines should be approved by your health care provider.  You may urinate more often because the fetus is pressing on your bladder.  You may develop or continue to have heartburn as a result of your pregnancy.  You may develop constipation because certain hormones are causing the muscles that push waste through your intestines to slow down.  You may develop hemorrhoids or swollen, bulging veins (varicose veins).  You may have back pain. This is caused by: ? Weight gain. ? Pregnancy hormones that are relaxing the joints in your pelvis. ? A shift in weight and the muscles that support your balance.  Your breasts will continue to grow and they will continue to become tender.  Your gums may bleed and may be sensitive to brushing and flossing.  Dark spots or blotches (chloasma, mask of pregnancy) may develop on  your face. This will likely fade after the baby is born.  A dark line from your belly button to the pubic area (linea nigra) may appear. This will likely fade after the baby is born.  You may have changes in your hair. These can include thickening of your hair, rapid growth, and changes in texture. Some women also have hair loss during or after pregnancy, or hair that feels dry or thin. Your hair will most likely return to normal after your baby is born. What to expect at prenatal visits During a routine prenatal visit:  You will be weighed to make sure you and the fetus are growing normally.  Your blood pressure will be taken.  Your abdomen will be measured to track your baby's growth.  The fetal heartbeat will be listened to.  Any test results from the previous visit will be discussed. Your health care provider may ask you:  How you are feeling.  If you are feeling the baby move.  If you have had any abnormal symptoms, such as leaking fluid, bleeding, severe headaches, or abdominal cramping.  If you are using any tobacco products, including cigarettes, chewing tobacco, and electronic cigarettes.  If you have any questions. Other tests that may be performed during your second trimester include:  Blood tests that check for: ? Low iron levels (anemia). ? High blood sugar that affects pregnant women (gestational diabetes) between 71 and 28 weeks. ? Rh antibodies. This is to check for  a protein on red blood cells (Rh factor).  Urine tests to check for infections, diabetes, or protein in the urine.  An ultrasound to confirm the proper growth and development of the baby.  An amniocentesis to check for possible genetic problems.  Fetal screens for spina bifida and Down syndrome.  HIV (human immunodeficiency virus) testing. Routine prenatal testing includes screening for HIV, unless you choose not to have this test. Follow these instructions at home: Medicines  Follow your  health care provider's instructions regarding medicine use. Specific medicines may be either safe or unsafe to take during pregnancy.  Take a prenatal vitamin that contains at least 600 micrograms (mcg) of folic acid.  If you develop constipation, try taking a stool softener if your health care provider approves. Eating and drinking   Eat a balanced diet that includes fresh fruits and vegetables, whole grains, good sources of protein such as meat, eggs, or tofu, and low-fat dairy. Your health care provider will help you determine the amount of weight gain that is right for you.  Avoid raw meat and uncooked cheese. These carry germs that can cause birth defects in the baby.  If you have low calcium intake from food, talk to your health care provider about whether you should take a daily calcium supplement.  Limit foods that are high in fat and processed sugars, such as fried and sweet foods.  To prevent constipation: ? Drink enough fluid to keep your urine clear or pale yellow. ? Eat foods that are high in fiber, such as fresh fruits and vegetables, whole grains, and beans. Activity  Exercise only as directed by your health care provider. Most women can continue their usual exercise routine during pregnancy. Try to exercise for 30 minutes at least 5 days a week. Stop exercising if you experience uterine contractions.  Avoid heavy lifting, wear low heel shoes, and practice good posture.  A sexual relationship may be continued unless your health care provider directs you otherwise. Relieving pain and discomfort  Wear a good support bra to prevent discomfort from breast tenderness.  Take warm sitz baths to soothe any pain or discomfort caused by hemorrhoids. Use hemorrhoid cream if your health care provider approves.  Rest with your legs elevated if you have leg cramps or low back pain.  If you develop varicose veins, wear support hose. Elevate your feet for 15 minutes, 3-4 times a day.  Limit salt in your diet. Prenatal Care  Write down your questions. Take them to your prenatal visits.  Keep all your prenatal visits as told by your health care provider. This is important. Safety  Wear your seat belt at all times when driving.  Make a list of emergency phone numbers, including numbers for family, friends, the hospital, and police and fire departments. General instructions  Ask your health care provider for a referral to a local prenatal education class. Begin classes no later than the beginning of month 6 of your pregnancy.  Ask for help if you have counseling or nutritional needs during pregnancy. Your health care provider can offer advice or refer you to specialists for help with various needs.  Do not use hot tubs, steam rooms, or saunas.  Do not douche or use tampons or scented sanitary pads.  Do not cross your legs for long periods of time.  Avoid cat litter boxes and soil used by cats. These carry germs that can cause birth defects in the baby and possibly loss of the fetus by  miscarriage or stillbirth.  Avoid all smoking, herbs, alcohol, and unprescribed drugs. Chemicals in these products can affect the formation and growth of the baby.  Do not use any products that contain nicotine or tobacco, such as cigarettes and e-cigarettes. If you need help quitting, ask your health care provider.  Visit your dentist if you have not gone yet during your pregnancy. Use a soft toothbrush to brush your teeth and be gentle when you floss. Contact a health care provider if:  You have dizziness.  You have mild pelvic cramps, pelvic pressure, or nagging pain in the abdominal area.  You have persistent nausea, vomiting, or diarrhea.  You have a bad smelling vaginal discharge.  You have pain when you urinate. Get help right away if:  You have a fever.  You are leaking fluid from your vagina.  You have spotting or bleeding from your vagina.  You have severe  abdominal cramping or pain.  You have rapid weight gain or weight loss.  You have shortness of breath with chest pain.  You notice sudden or extreme swelling of your face, hands, ankles, feet, or legs.  You have not felt your baby move in over an hour.  You have severe headaches that do not go away when you take medicine.  You have vision changes. Summary  The second trimester is from week 14 through week 27 (months 4 through 6). It is also a time when the fetus is growing rapidly.  Your body goes through many changes during pregnancy. The changes vary from woman to woman.  Avoid all smoking, herbs, alcohol, and unprescribed drugs. These chemicals affect the formation and growth your baby.  Do not use any tobacco products, such as cigarettes, chewing tobacco, and e-cigarettes. If you need help quitting, ask your health care provider.  Contact your health care provider if you have any questions. Keep all prenatal visits as told by your health care provider. This is important. This information is not intended to replace advice given to you by your health care provider. Make sure you discuss any questions you have with your health care provider. Document Revised: 06/22/2018 Document Reviewed: 04/05/2016 Elsevier Patient Education  2020 [employer]    Perinatal Anxiety When a woman feels excessive tension or worry (anxiety) during pregnancy or during the first 12 months after she gives birth, she has a condition called perinatal anxiety. Anxiety can interfere with work, school, relationships, and other everyday activities. If it is not managed properly, it can also cause problems in the mother and her baby.  If you are pregnant and you have symptoms of an anxiety disorder, it is important to talk with your health care provider. What are the causes? The exact cause of this condition is not known. Hormonal changes during and after pregnancy may play a role in causing perinatal anxiety.  What increases the risk? You are more likely to develop this condition if:  You have a personal or family history of depression, anxiety, or mood disorders.  You experience a stressful life event during pregnancy, such as the death of a loved one.  You have a lot of regular life stress, such as being a single parent.  You have thyroid problems. What are the signs or symptoms? Perinatal anxiety can be different for everyone. It may include:  Panic attacks (panic disorder). These are intense episodes of fear or discomfort that may also cause sweating, nausea, shortness of breath, or fear of dying. They usually last 5-15  minutes.  Reliving an upsetting (traumatic) event through distressing thoughts, dreams, or flashbacks (post-traumatic stress disorder, or PTSD).  Excessive worry about multiple problems (generalized anxiety disorder).  Fear and stress about leaving certain people or loved ones (separation anxiety).  Performing repetitive tasks (compulsions) to relieve stress or worry (obsessive compulsive disorder, or OCD).  Fear of certain objects or situations (phobias).  Excessive worrying, such as a constant feeling that something bad is going to happen.  Inability to relax.  Difficulty concentrating.  Sleep problems.  Frequent nightmares or disturbing thoughts. How is this diagnosed? This condition is diagnosed based on a physical exam and mental evaluation. In some cases, your health care provider may use an anxiety screening tool. These tools include a list of questions that can help a health care provider diagnose anxiety. Your health care provider may refer you to a mental health expert who specializes in anxiety. How is this treated? This condition may be treated with:  Medicines. Your health care provider will only give you medicines that have been proven safe for pregnancy and breastfeeding.  Talk therapy with a mental health professional to help change your  patterns of thinking (cognitive behavioral therapy).  Mindfulness-based stress reduction.  Other relaxation therapies, such as deep breathing or guided muscle relaxation.  Support groups. Follow these instructions at home: Lifestyle  Do not use any products that contain nicotine or tobacco, such as cigarettes and e-cigarettes. If you need help quitting, ask your health care provider.  Do not use alcohol when you are pregnant. After your baby is born, limit alcohol intake to no more than 1 drink a day. One drink equals 12 oz of beer, 5 oz of wine, or 1 oz of hard liquor.  Consider joining a support group for new mothers. Ask your health care provider for recommendations.  Take good care of yourself. Make sure you: ? Get plenty of sleep. If you are having trouble sleeping, talk with your health care provider. ? Eat a healthy diet. This includes plenty of fruits and vegetables, whole grains, and lean proteins. ? Exercise regularly, as told by your health care provider. Ask your health care provider what exercises are safe for you. General instructions  Take over-the-counter and prescription medicines only as told by your health care provider.  Talk with your partner or family members about your feelings during pregnancy. Share any concerns or fears that you may have.  Ask for help with tasks or chores when you need it. Ask friends and family members to provide meals, watch your children, or help with cleaning.  Keep all follow-up visits as told by your health care provider. This is important. Contact a health care provider if:  You (or people close to you) notice that you have any symptoms of anxiety or depression.  You have anxiety and your symptoms get worse.  You experience side effects from medicines, such as nausea or sleep problems. Get help right away if:  You feel like hurting yourself, your baby, or someone else. If you ever feel like you may hurt yourself or others, or  have thoughts about taking your own life, get help right away. You can go to your nearest emergency department or call:  Your local emergency services (911 in the [place]  A suicide crisis helpline, such as the National Suicide Prevention Lifeline at (425) 594-0565. This is open 24 hours a day. Summary  Perinatal anxiety is when a woman feels excessive tension or worry during pregnancy or during the  first 12 months after she gives birth.  Perinatal anxiety may include panic attacks, post-traumatic stress disorder, separation anxiety, phobias, or generalized anxiety.  Perinatal anxiety can cause physical health problems in the mother and baby if not properly managed.  This condition is treated with medicines, talk therapy, stress reduction therapies, or a combination of two or more treatments.  Talk with your partner or family members about your concerns or fears. Do not be afraid to ask for help. This information is not intended to replace advice given to you by your health care provider. Make sure you discuss any questions you have with your health care provider. Document Revised: 03/03/2017 Document Reviewed: 04/27/2016 Elsevier Patient Education  [place].

## 2019-04-17 ENCOUNTER — Encounter: Payer: Self-pay | Admitting: Obstetrics and Gynecology

## 2019-04-17 LAB — CBC
Hematocrit: 38.8 % (ref 34.0–46.6)
Hemoglobin: 13.1 g/dL (ref 11.1–15.9)
MCH: 28.7 pg (ref 26.6–33.0)
MCHC: 33.8 g/dL (ref 31.5–35.7)
MCV: 85 fL (ref 79–97)
Platelets: 200 10*3/uL (ref 150–450)
RBC: 4.57 x10E6/uL (ref 3.77–5.28)
RDW: 12.8 % (ref 11.7–15.4)
WBC: 7.6 10*3/uL (ref 3.4–10.8)

## 2019-04-30 ENCOUNTER — Telehealth: Payer: Self-pay | Admitting: Obstetrics and Gynecology

## 2019-04-30 NOTE — Telephone Encounter (Signed)
The pt called in and stated that she is very sick she was tested for covid it came back negative. The pt is also wanting to know if her test results are back in. Pt is requesting a call back. Please advise

## 2019-04-30 NOTE — Telephone Encounter (Signed)
Pt called in again the pt is requesting a call back. Please advise.

## 2019-05-01 ENCOUNTER — Telehealth: Payer: Self-pay

## 2019-05-01 NOTE — Telephone Encounter (Signed)
Pt called and is aware that her genetic testing results are normal and the results of her test can be picked up at the front desk. Pt stated that she was on her way to pick the results.

## 2019-05-01 NOTE — Telephone Encounter (Signed)
Pt called no answer unable to LM due to VM being full. Was calling to inform her about genetic testing results were normal and test results are placed at front desk for pick up with sex of fetal.

## 2019-05-01 NOTE — Telephone Encounter (Signed)
Spoke with patient to let her know that she can take mucinex plain and  nasal saline to help with her congestion. Patient verbalized understanding.

## 2019-05-09 ENCOUNTER — Encounter: Payer: Self-pay | Admitting: Obstetrics and Gynecology

## 2019-05-09 DIAGNOSIS — D563 Thalassemia minor: Secondary | ICD-10-CM | POA: Insufficient documentation

## 2019-05-14 ENCOUNTER — Other Ambulatory Visit: Payer: Self-pay

## 2019-05-14 ENCOUNTER — Ambulatory Visit (INDEPENDENT_AMBULATORY_CARE_PROVIDER_SITE_OTHER): Payer: Medicaid Other | Admitting: Obstetrics and Gynecology

## 2019-05-14 ENCOUNTER — Encounter: Payer: Self-pay | Admitting: Obstetrics and Gynecology

## 2019-05-14 VITALS — BP 113/77 | HR 105 | Wt 166.1 lb

## 2019-05-14 DIAGNOSIS — Z3A16 16 weeks gestation of pregnancy: Secondary | ICD-10-CM

## 2019-05-14 DIAGNOSIS — Z3492 Encounter for supervision of normal pregnancy, unspecified, second trimester: Secondary | ICD-10-CM

## 2019-05-14 LAB — POCT URINALYSIS DIPSTICK OB
Bilirubin, UA: NEGATIVE
Blood, UA: NEGATIVE
Glucose, UA: NEGATIVE
Nitrite, UA: NEGATIVE
POC,PROTEIN,UA: NEGATIVE
Spec Grav, UA: 1.015 (ref 1.010–1.025)
Urobilinogen, UA: 0.2 E.U./dL
pH, UA: 7.5 (ref 5.0–8.0)

## 2019-05-14 NOTE — Progress Notes (Signed)
ROB-Patient c/o constant abdominal cramping, lower back pain and vaginal pain x1 week, takes Tylenol at least 2-3 times daily.  "I'm under a lot of stress at home".

## 2019-05-14 NOTE — Progress Notes (Signed)
ROB: Patient complains of severe stress at home-denies physical interaction.  States that she does have a safe place to go if necessary.  She and her partner will spend "some days apart".  Complains of contractions and feeling like she is in labor.  No contractions palpated, speculum examination reveals closed long cervix.  Rose to see today.  Desires AFP.  Ultrasound next visit.

## 2019-05-16 LAB — AFP TETRA
DIA Mom Value: 2.63
DIA Value (EIA): 404.71 pg/mL
DSR (By Age)    1 IN: 1052
DSR (Second Trimester) 1 IN: 429
Gestational Age: 16.4 WEEKS
MSAFP Mom: 1.08
MSAFP: 35.5 ng/mL
MSHCG Mom: 1.52
MSHCG: 53279 m[IU]/mL
Maternal Age At EDD: 24.5 yr
Osb Risk: 9540
T18 (By Age): 1:4098 {titer}
Test Results:: NEGATIVE
Weight: 166 [lb_av]
uE3 Mom: 0.96
uE3 Value: 0.97 ng/mL

## 2019-05-23 NOTE — Telephone Encounter (Signed)
error 

## 2019-06-04 ENCOUNTER — Telehealth: Payer: Self-pay | Admitting: Obstetrics and Gynecology

## 2019-06-04 NOTE — Telephone Encounter (Signed)
Hello Sandra Rivera, Dr. Valentino Saxon stated that if there is an opening on the u/s schedule that is fine with her she can have the u/s done.  Thanks [employer]

## 2019-06-04 NOTE — Telephone Encounter (Signed)
Patient called today and stated she has an app next week for an anatomy scan and a f/u with Dr.Cherry. Pt is wanting to know if there is anyway we can get her in for the scan this week and keep the app with cherry next week. Patient stated the father of the baby works out of town and will not be able to be present. I made her aware of the days our tech [place] is here and also that this was something I had to run by the provider. Patient also mentioned that even if it was a last min about they would make it work. They are trying to avoid the expense of having him fly back in town. Can you please give her a call and let her know if we can work with them. Thanks

## 2019-06-05 NOTE — Telephone Encounter (Signed)
Hello after our conversation were you able to contact the pt to reschedule her? Thanks [employer]

## 2019-06-05 NOTE — Telephone Encounter (Signed)
Per prior conversation , the only slots available are labeled as anatomy scans however are only 15 min slot. Will Dr Valentino Saxon approve this ?

## 2019-06-06 ENCOUNTER — Other Ambulatory Visit: Payer: Self-pay

## 2019-06-06 ENCOUNTER — Ambulatory Visit (INDEPENDENT_AMBULATORY_CARE_PROVIDER_SITE_OTHER): Payer: Medicaid Other

## 2019-06-06 DIAGNOSIS — Z3492 Encounter for supervision of normal pregnancy, unspecified, second trimester: Secondary | ICD-10-CM

## 2019-06-06 DIAGNOSIS — Z3A19 19 weeks gestation of pregnancy: Secondary | ICD-10-CM

## 2019-06-10 NOTE — Patient Instructions (Addendum)
Second Trimester of Pregnancy  The second trimester is from week 14 through week 27 (month 4 through 6). This is often the time in pregnancy that you feel your best. Often times, morning sickness has lessened or quit. You may have more energy, and you may get hungry more often. Your unborn baby is growing rapidly. At the end of the sixth month, he or she is about 9 inches long and weighs about 1 pounds. You will likely feel the baby move between 18 and 20 weeks of pregnancy. Follow these instructions at home: Medicines  Take over-the-counter and prescription medicines only as told by your doctor. Some medicines are safe and some medicines are not safe during pregnancy.  Take a prenatal vitamin that contains at least 600 micrograms (mcg) of folic acid.  If you have trouble pooping (constipation), take medicine that will make your stool soft (stool softener) if your doctor approves. Eating and drinking   Eat regular, healthy meals.  Avoid raw meat and uncooked cheese.  If you get low calcium from the food you eat, talk to your doctor about taking a daily calcium supplement.  Avoid foods that are high in fat and sugars, such as fried and sweet foods.  If you feel sick to your stomach (nauseous) or throw up (vomit): ? Eat 4 or 5 small meals a day instead of 3 large meals. ? Try eating a few soda crackers. ? Drink liquids between meals instead of during meals.  To prevent constipation: ? Eat foods that are high in fiber, like fresh fruits and vegetables, whole grains, and beans. ? Drink enough fluids to keep your pee (urine) clear or pale yellow. Activity  Exercise only as told by your doctor. Stop exercising if you start to have cramps.  Do not exercise if it is too hot, too humid, or if you are in a place of great height (high altitude).  Avoid heavy lifting.  Wear low-heeled shoes. Sit and stand up straight.  You can continue to have sex unless your doctor tells you not  to. Relieving pain and discomfort  Wear a good support bra if your breasts are tender.  Take warm water baths (sitz baths) to soothe pain or discomfort caused by hemorrhoids. Use hemorrhoid cream if your doctor approves.  Rest with your legs raised if you have leg cramps or low back pain.  If you develop puffy, bulging veins (varicose veins) in your legs: ? Wear support hose or compression stockings as told by your doctor. ? Raise (elevate) your feet for 15 minutes, 3-4 times a day. ? Limit salt in your food. Prenatal care  Write down your questions. Take them to your prenatal visits.  Keep all your prenatal visits as told by your doctor. This is important. Safety  Wear your seat belt when driving.  Make a list of emergency phone numbers, including numbers for family, friends, the hospital, and police and fire departments. General instructions  Ask your doctor about the right foods to eat or for help finding a counselor, if you need these services.  Ask your doctor about local prenatal classes. Begin classes before month 6 of your pregnancy.  Do not use hot tubs, steam rooms, or saunas.  Do not douche or use tampons or scented sanitary pads.  Do not cross your legs for long periods of time.  Visit your dentist if you have not done so. Use a soft toothbrush to brush your teeth. Floss gently.  Avoid all smoking, herbs,   and alcohol. Avoid drugs that are not approved by your doctor.  Do not use any products that contain nicotine or tobacco, such as cigarettes and e-cigarettes. If you need help quitting, ask your doctor.  Avoid cat litter boxes and soil used by cats. These carry germs that can cause birth defects in the baby and can cause a loss of your baby (miscarriage) or stillbirth. Contact a doctor if:  You have mild cramps or pressure in your lower belly.  You have pain when you pee (urinate).  You have bad smelling fluid coming from your vagina.  You continue to  feel sick to your stomach (nauseous), throw up (vomit), or have watery poop (diarrhea).  You have a nagging pain in your belly area.  You feel dizzy. Get help right away if:  You have a fever.  You are leaking fluid from your vagina.  You have spotting or bleeding from your vagina.  You have severe belly cramping or pain.  You lose or gain weight rapidly.  You have trouble catching your breath and have chest pain.  You notice sudden or extreme puffiness (swelling) of your face, hands, ankles, feet, or legs.  You have not felt the baby move in over an hour.  You have severe headaches that do not go away when you take medicine.  You have trouble seeing. Summary  The second trimester is from week 14 through week 27 (months 4 through 6). This is often the time in pregnancy that you feel your best.  To take care of yourself and your unborn baby, you will need to eat healthy meals, take medicines only if your doctor tells you to do so, and do activities that are safe for you and your baby.  Call your doctor if you get sick or if you notice anything unusual about your pregnancy. Also, call your doctor if you need help with the right food to eat, or if you want to know what activities are safe for you. This information is not intended to replace advice given to you by your health care provider. Make sure you discuss any questions you have with your health care provider. Document Revised: 06/22/2018 Document Reviewed: 04/05/2016 Elsevier Patient Education  2020 [employer].  Breastfeeding  Choosing to breastfeed is one of the best decisions you can make for yourself and your baby. A change in hormones during pregnancy causes your breasts to make breast milk in your milk-producing glands. Hormones prevent breast milk from being released before your baby is born. They also prompt milk flow after birth. Once breastfeeding has begun, thoughts of your baby, as well as his or her sucking or  crying, can stimulate the release of milk from your milk-producing glands. Benefits of breastfeeding Research shows that breastfeeding offers many health benefits for infants and mothers. It also offers a cost-free and convenient way to feed your baby. For your baby  Your first milk (colostrum) helps your baby's digestive system to function better.  Special cells in your milk (antibodies) help your baby to fight off infections.  Breastfed babies are less likely to develop asthma, allergies, obesity, or type 2 diabetes. They are also at lower risk for sudden infant death syndrome (SIDS).  Nutrients in breast milk are better able to meet your baby's needs compared to infant formula.  Breast milk improves your baby's brain development. For you  Breastfeeding helps to create a very special bond between you and your baby.  Breastfeeding is convenient. Breast milk  costs nothing and is always available at the correct temperature.  Breastfeeding helps to burn calories. It helps you to lose the weight that you gained during pregnancy.  Breastfeeding makes your uterus return faster to its size before pregnancy. It also slows bleeding (lochia) after you give birth.  Breastfeeding helps to lower your risk of developing type 2 diabetes, osteoporosis, rheumatoid arthritis, cardiovascular disease, and breast, ovarian, uterine, and endometrial cancer later in life. Breastfeeding basics Starting breastfeeding  Find a comfortable place to sit or lie down, with your neck and back well-supported.  Place a pillow or a rolled-up blanket under your baby to bring him or her to the level of your breast (if you are seated). Nursing pillows are specially designed to help support your arms and your baby while you breastfeed.  Make sure that your baby's tummy (abdomen) is facing your abdomen.  Gently massage your breast. With your fingertips, massage from the outer edges of your breast inward toward the nipple.  This encourages milk flow. If your milk flows slowly, you may need to continue this action during the feeding.  Support your breast with 4 fingers underneath and your thumb above your nipple (make the letter "C" with your hand). Make sure your fingers are well away from your nipple and your baby's mouth.  Stroke your baby's lips gently with your finger or nipple.  When your baby's mouth is open wide enough, quickly bring your baby to your breast, placing your entire nipple and as much of the areola as possible into your baby's mouth. The areola is the colored area around your nipple. ? More areola should be visible above your baby's upper lip than below the lower lip. ? Your baby's lips should be opened and extended outward (flanged) to ensure an adequate, comfortable latch. ? Your baby's tongue should be between his or her lower gum and your breast.  Make sure that your baby's mouth is correctly positioned around your nipple (latched). Your baby's lips should create a seal on your breast and be turned out (everted).  It is common for your baby to suck about 2-3 minutes in order to start the flow of breast milk. Latching Teaching your baby how to latch onto your breast properly is very important. An improper latch can cause nipple pain, decreased milk supply, and poor weight gain in your baby. Also, if your baby is not latched onto your nipple properly, he or she may swallow some air during feeding. This can make your baby fussy. Burping your baby when you switch breasts during the feeding can help to get rid of the air. However, teaching your baby to latch on properly is still the best way to prevent fussiness from swallowing air while breastfeeding. Signs that your baby has successfully latched onto your nipple  Silent tugging or silent sucking, without causing you pain. Infant's lips should be extended outward (flanged).  Swallowing heard between every 3-4 sucks once your milk has started to  flow (after your let-down milk reflex occurs).  Muscle movement above and in front of his or her ears while sucking. Signs that your baby has not successfully latched onto your nipple  Sucking sounds or smacking sounds from your baby while breastfeeding.  Nipple pain. If you think your baby has not latched on correctly, slip your finger into the corner of your baby's mouth to break the suction and place it between your baby's gums. Attempt to start breastfeeding again. Signs of successful breastfeeding Signs from  your baby  Your baby will gradually decrease the number of sucks or will completely stop sucking.  Your baby will fall asleep.  Your baby's body will relax.  Your baby will retain a small amount of milk in his or her mouth.  Your baby will let go of your breast by himself or herself. Signs from you  Breasts that have increased in firmness, weight, and size 1-3 hours after feeding.  Breasts that are softer immediately after breastfeeding.  Increased milk volume, as well as a change in milk consistency and color by the fifth day of breastfeeding.  Nipples that are not sore, cracked, or bleeding. Signs that your baby is getting enough milk  Wetting at least 1-2 diapers during the first 24 hours after birth.  Wetting at least 5-6 diapers every 24 hours for the first week after birth. The urine should be clear or pale yellow by the age of 5 days.  Wetting 6-8 diapers every 24 hours as your baby continues to grow and develop.  At least 3 stools in a 24-hour period by the age of 5 days. The stool should be soft and yellow.  At least 3 stools in a 24-hour period by the age of 7 days. The stool should be seedy and yellow.  No loss of weight greater than 10% of birth weight during the first 3 days of life.  Average weight gain of 4-7 oz (113-198 g) per week after the age of 4 days.  Consistent daily weight gain by the age of 5 days, without weight loss after the age of 2  weeks. After a feeding, your baby may spit up a small amount of milk. This is normal. Breastfeeding frequency and duration Frequent feeding will help you make more milk and can prevent sore nipples and extremely full breasts (breast engorgement). Breastfeed when you feel the need to reduce the fullness of your breasts or when your baby shows signs of hunger. This is called "breastfeeding on demand." Signs that your baby is hungry include:  Increased alertness, activity, or restlessness.  Movement of the head from side to side.  Opening of the mouth when the corner of the mouth or cheek is stroked (rooting).  Increased sucking sounds, smacking lips, cooing, sighing, or squeaking.  Hand-to-mouth movements and sucking on fingers or hands.  Fussing or crying. Avoid introducing a pacifier to your baby in the first 4-6 weeks after your baby is born. After this time, you may choose to use a pacifier. Research has shown that pacifier use during the first year of a baby's life decreases the risk of sudden infant death syndrome (SIDS). Allow your baby to feed on each breast as long as he or she wants. When your baby unlatches or falls asleep while feeding from the first breast, offer the second breast. Because newborns are often sleepy in the first few weeks of life, you may need to awaken your baby to get him or her to feed. Breastfeeding times will vary from baby to baby. However, the following rules can serve as a guide to help you make sure that your baby is properly fed:  Newborns (babies 77 weeks of age or younger) may breastfeed every 1-3 hours.  Newborns should not go without breastfeeding for longer than 3 hours during the day or 5 hours during the night.  You should breastfeed your baby a minimum of 8 times in a 24-hour period. Breast milk pumping     Pumping and storing breast  milk allows you to make sure that your baby is exclusively fed your breast milk, even at times when you are  unable to breastfeed. This is especially important if you go back to work while you are still breastfeeding, or if you are not able to be present during feedings. Your lactation consultant can help you find a method of pumping that works best for you and give you guidelines about how long it is safe to store breast milk. Caring for your breasts while you breastfeed Nipples can become dry, cracked, and sore while breastfeeding. The following recommendations can help keep your breasts moisturized and healthy:  Avoid using soap on your nipples.  Wear a supportive bra designed especially for nursing. Avoid wearing underwire-style bras or extremely tight bras (sports bras).  Air-dry your nipples for 3-4 minutes after each feeding.  Use only cotton bra pads to absorb leaked breast milk. Leaking of breast milk between feedings is normal.  Use lanolin on your nipples after breastfeeding. Lanolin helps to maintain your skin's normal moisture barrier. Pure lanolin is not harmful (not toxic) to your baby. You may also hand express a few drops of breast milk and gently massage that milk into your nipples and allow the milk to air-dry. In the first few weeks after giving birth, some women experience breast engorgement. Engorgement can make your breasts feel heavy, warm, and tender to the touch. Engorgement peaks within 3-5 days after you give birth. The following recommendations can help to ease engorgement:  Completely empty your breasts while breastfeeding or pumping. You may want to start by applying warm, moist heat (in the shower or with warm, water-soaked hand towels) just before feeding or pumping. This increases circulation and helps the milk flow. If your baby does not completely empty your breasts while breastfeeding, pump any extra milk after he or she is finished.  Apply ice packs to your breasts immediately after breastfeeding or pumping, unless this is too uncomfortable for you. To do this: ? Put  ice in a plastic bag. ? Place a towel between your skin and the bag. ? Leave the ice on for 20 minutes, 2-3 times a day.  Make sure that your baby is latched on and positioned properly while breastfeeding. If engorgement persists after 48 hours of following these recommendations, contact your health care provider or a Advertising copywriter. Overall health care recommendations while breastfeeding  Eat 3 healthy meals and 3 snacks every day. Well-nourished mothers who are breastfeeding need an additional 450-500 calories a day. You can meet this requirement by increasing the amount of a balanced diet that you eat.  Drink enough water to keep your urine pale yellow or clear.  Rest often, relax, and continue to take your prenatal vitamins to prevent fatigue, stress, and low vitamin and mineral levels in your body (nutrient deficiencies).  Do not use any products that contain nicotine or tobacco, such as cigarettes and e-cigarettes. Your baby may be harmed by chemicals from cigarettes that pass into breast milk and exposure to secondhand smoke. If you need help quitting, ask your health care provider.  Avoid alcohol.  Do not use illegal drugs or marijuana.  Talk with your health care provider before taking any medicines. These include over-the-counter and prescription medicines as well as vitamins and herbal supplements. Some medicines that may be harmful to your baby can pass through breast milk.  It is possible to become pregnant while breastfeeding. If birth control is desired, ask your health care provider  about options that will be safe while breastfeeding your baby. Where to find more information: [employer]: www.llli.org Contact a health care provider if:  You feel like you want to stop breastfeeding or have become frustrated with breastfeeding.  Your nipples are cracked or bleeding.  Your breasts are red, tender, or warm.  You have: ? Painful breasts or  nipples. ? A swollen area on either breast. ? A fever or chills. ? Nausea or vomiting. ? Drainage other than breast milk from your nipples.  Your breasts do not become full before feedings by the fifth day after you give birth.  You feel sad and depressed.  Your baby is: ? Too sleepy to eat well. ? Having trouble sleeping. ? More than 21 week old and wetting fewer than 6 diapers in a 24-hour period. ? Not gaining weight by 28 days of age.  Your baby has fewer than 3 stools in a 24-hour period.  Your baby's skin or the white parts of his or her eyes become yellow. Get help right away if:  Your baby is overly tired (lethargic) and does not want to wake up and feed.  Your baby develops an unexplained fever. Summary  Breastfeeding offers many health benefits for infant and mothers.  Try to breastfeed your infant when he or she shows early signs of hunger.  Gently tickle or stroke your baby's lips with your finger or nipple to allow the baby to open his or her mouth. Bring the baby to your breast. Make sure that much of the areola is in your baby's mouth. Offer one side and burp the baby before you offer the other side.  Talk with your health care provider or lactation consultant if you have questions or you face problems as you breastfeed. This information is not intended to replace advice given to you by your health care provider. Make sure you discuss any questions you have with your health care provider. Document Revised: 05/25/2017 Document Reviewed: 04/01/2016 Elsevier Patient Education  2020 Else[employer] Inc.   [employer] of the uterus can occur throughout pregnancy, but they are not always a sign that you are in labor. You may have practice contractions called Braxton Hicks contractions. These false labor contractions are sometimes confused with true labor. What are [place] contractions? Braxton Hicks contractions are tightening movements that  occur in the muscles of the uterus before labor. Unlike true labor contractions, these contractions do not result in opening (dilation) and thinning of the cervix. Toward the end of pregnancy (32-34 weeks), Braxton Hicks contractions can happen more often and may become stronger. These contractions are sometimes difficult to tell apart from true labor because they can be very uncomfortable. You should not feel embarrassed if you go to the hospital with false labor. Sometimes, the only way to tell if you are in true labor is for your health care provider to look for changes in the cervix. The health care provider will do a physical exam and may monitor your contractions. If you are not in true labor, the exam should show that your cervix is not dilating and your water has not broken. If there are no other health problems associated with your pregnancy, it is completely safe for you to be sent home with false labor. You may continue to have Braxton Hicks contractions until you go into true labor. How to tell the difference between true labor and false labor True labor  Contractions last 30-70 seconds.  Contractions become very regular.  Discomfort is usually felt in the top of the uterus, and it spreads to the lower abdomen and low back.  Contractions do not go away with walking.  Contractions usually become more intense and increase in frequency.  The cervix dilates and gets thinner. False labor  Contractions are usually shorter and not as strong as true labor contractions.  Contractions are usually irregular.  Contractions are often felt in the front of the lower abdomen and in the groin.  Contractions may go away when you walk around or change positions while lying down.  Contractions get weaker and are shorter-lasting as time goes on.  The cervix usually does not dilate or become thin. Follow these instructions at home:   Take over-the-counter and prescription medicines only as  told by your health care provider.  Keep up with your usual exercises and follow other instructions from your health care provider.  Eat and drink lightly if you think you are going into labor.  If Braxton Hicks contractions are making you uncomfortable: ? Change your position from lying down or resting to walking, or change from walking to resting. ? Sit and rest in a tub of warm water. ? Drink enough fluid to keep your urine pale yellow. Dehydration may cause these contractions. ? Do slow and deep breathing several times an hour.  Keep all follow-up prenatal visits as told by your health care provider. This is important. Contact a health care provider if:  You have a fever.  You have continuous pain in your abdomen. Get help right away if:  Your contractions become stronger, more regular, and closer together.  You have fluid leaking or gushing from your vagina.  You pass blood-tinged mucus (bloody show).  You have bleeding from your vagina.  You have low back pain that you never had before.  You feel your baby's head pushing down and causing pelvic pressure.  Your baby is not moving inside you as much as it used to. Summary  Contractions that occur before labor are called Braxton Hicks contractions, false labor, or practice contractions.  Braxton Hicks contractions are usually shorter, weaker, farther apart, and less regular than true labor contractions. True labor contractions usually become progressively stronger and regular, and they become more frequent.  Manage discomfort from [facility] by changing position, resting in a warm bath, drinking plenty of water, or practicing deep breathing. This information is not intended to replace advice given to you by your health care provider. Make sure you discuss any questions you have with your health care provider. Document Revised: 02/10/2017 Document Reviewed: 07/14/2016 Elsevier Patient Education  2020  [employer]

## 2019-06-10 NOTE — Progress Notes (Signed)
ROB-Pt present for routine prenatal care. Pt c/o of braxton hicks contraction. Pt stated that the baby feels low and in her vaginal area.

## 2019-06-11 ENCOUNTER — Other Ambulatory Visit: Payer: Medicaid Other

## 2019-06-11 ENCOUNTER — Encounter: Payer: Self-pay | Admitting: Obstetrics and Gynecology

## 2019-06-11 ENCOUNTER — Ambulatory Visit (INDEPENDENT_AMBULATORY_CARE_PROVIDER_SITE_OTHER): Payer: Medicaid Other | Admitting: Obstetrics and Gynecology

## 2019-06-11 ENCOUNTER — Other Ambulatory Visit: Payer: Self-pay

## 2019-06-11 VITALS — BP 98/63 | HR 103 | Wt 170.6 lb

## 2019-06-11 DIAGNOSIS — Z3A20 20 weeks gestation of pregnancy: Secondary | ICD-10-CM

## 2019-06-11 DIAGNOSIS — O479 False labor, unspecified: Secondary | ICD-10-CM

## 2019-06-11 DIAGNOSIS — Z3482 Encounter for supervision of other normal pregnancy, second trimester: Secondary | ICD-10-CM

## 2019-06-11 LAB — POCT URINALYSIS DIPSTICK OB
Bilirubin, UA: NEGATIVE
Blood, UA: NEGATIVE
Glucose, UA: NEGATIVE
Ketones, UA: NEGATIVE
Nitrite, UA: NEGATIVE
POC,PROTEIN,UA: NEGATIVE
Spec Grav, UA: 1.02 (ref 1.010–1.025)
Urobilinogen, UA: 0.2 E.U./dL
pH, UA: 7.5 (ref 5.0–8.0)

## 2019-06-11 NOTE — Progress Notes (Signed)
ROB: Patient complains of pelvic/vaginal pain and notes Deberah Pelton. Discussed management.  Also notes feeling lightheaded and flushed from time to time. Discussed normal physiologic changes in vascularity, hypotensino during second trimester. Advised on increasing hydration. Normal anatomy scan noted. RTC in 4 weeks.

## 2019-06-26 ENCOUNTER — Other Ambulatory Visit: Payer: Self-pay

## 2019-06-26 ENCOUNTER — Other Ambulatory Visit: Payer: Medicaid Other

## 2019-06-26 ENCOUNTER — Telehealth: Payer: Self-pay | Admitting: Obstetrics and Gynecology

## 2019-06-26 DIAGNOSIS — N898 Other specified noninflammatory disorders of vagina: Secondary | ICD-10-CM

## 2019-06-26 NOTE — Telephone Encounter (Signed)
Patient stated that her partner is having green discharge and bleeding. I am having her come in to drop off a urine sample to be tested for STD per Dr. Valentino Saxon.

## 2019-06-26 NOTE — Telephone Encounter (Signed)
Patient called in wanting to know if she was testing for stds when she had her lab work done for her pregnancy.

## 2019-06-28 ENCOUNTER — Other Ambulatory Visit: Payer: Self-pay | Admitting: Surgical

## 2019-06-28 LAB — GC/CHLAMYDIA PROBE AMP
Chlamydia trachomatis, NAA: POSITIVE — AB
Neisseria Gonorrhoeae by PCR: NEGATIVE

## 2019-06-28 MED ORDER — AZITHROMYCIN 500 MG PO TABS: 1000.0000 mg | ORAL_TABLET | Freq: Once | ORAL | 1 refills | Status: AC

## 2019-06-28 NOTE — Progress Notes (Signed)
A 

## 2019-07-08 ENCOUNTER — Telehealth: Payer: Self-pay | Admitting: Obstetrics and Gynecology

## 2019-07-08 NOTE — Telephone Encounter (Signed)
Please advise 

## 2019-07-08 NOTE — Telephone Encounter (Signed)
Patient called in requesting a DNA test be done on her baby. I informed patient I didn't know if we did those or not but I would send a message to the nurse to find out. Could you please advise?

## 2019-07-08 NOTE — Telephone Encounter (Signed)
Pt called no answer and no VM picked up.

## 2019-07-09 ENCOUNTER — Ambulatory Visit (INDEPENDENT_AMBULATORY_CARE_PROVIDER_SITE_OTHER): Payer: Medicaid Other | Admitting: Obstetrics and Gynecology

## 2019-07-09 ENCOUNTER — Other Ambulatory Visit: Payer: Self-pay

## 2019-07-09 ENCOUNTER — Encounter: Payer: Self-pay | Admitting: Obstetrics and Gynecology

## 2019-07-09 VITALS — BP 114/73 | HR 103 | Wt 173.8 lb

## 2019-07-09 DIAGNOSIS — Z3482 Encounter for supervision of other normal pregnancy, second trimester: Secondary | ICD-10-CM

## 2019-07-09 NOTE — Progress Notes (Signed)
ROB: Patient doing well.  Has found low income housing.  Recently treated for chlamydia.  States that she is no longer sexually active with that person. Needs TOC later in pregnancy.  1 hour GCT next visit

## 2019-07-17 ENCOUNTER — Telehealth: Payer: Self-pay | Admitting: Obstetrics and Gynecology

## 2019-07-17 NOTE — Telephone Encounter (Signed)
Spoke with patient and she is having pelvic pain from moving furniture. I advised per Dr. Logan Bores to alternate between heat and ice. Patient is already taking tylenol. I told her that if the pain is unbearable that she can go to labor and deliver to be seen.

## 2019-07-17 NOTE — Telephone Encounter (Signed)
Pt called stating she is experiencing pelvic pain. She was moving around furniture and feels that she may have strained she is needing medical advice before going to the er. Thanks

## 2019-08-06 ENCOUNTER — Ambulatory Visit (INDEPENDENT_AMBULATORY_CARE_PROVIDER_SITE_OTHER): Payer: Medicaid Other | Admitting: Obstetrics and Gynecology

## 2019-08-06 ENCOUNTER — Encounter: Payer: Self-pay | Admitting: Obstetrics and Gynecology

## 2019-08-06 ENCOUNTER — Other Ambulatory Visit: Payer: Self-pay

## 2019-08-06 ENCOUNTER — Other Ambulatory Visit: Payer: Medicaid Other

## 2019-08-06 VITALS — BP 96/61 | HR 96 | Wt 183.6 lb

## 2019-08-06 DIAGNOSIS — O26899 Other specified pregnancy related conditions, unspecified trimester: Secondary | ICD-10-CM

## 2019-08-06 DIAGNOSIS — K219 Gastro-esophageal reflux disease without esophagitis: Secondary | ICD-10-CM

## 2019-08-06 DIAGNOSIS — Z23 Encounter for immunization: Secondary | ICD-10-CM | POA: Diagnosis not present

## 2019-08-06 DIAGNOSIS — Z6791 Unspecified blood type, Rh negative: Secondary | ICD-10-CM | POA: Diagnosis not present

## 2019-08-06 DIAGNOSIS — O99613 Diseases of the digestive system complicating pregnancy, third trimester: Secondary | ICD-10-CM

## 2019-08-06 DIAGNOSIS — Z3483 Encounter for supervision of other normal pregnancy, third trimester: Secondary | ICD-10-CM

## 2019-08-06 DIAGNOSIS — Z3A28 28 weeks gestation of pregnancy: Secondary | ICD-10-CM

## 2019-08-06 DIAGNOSIS — Z8619 Personal history of other infectious and parasitic diseases: Secondary | ICD-10-CM

## 2019-08-06 LAB — POCT URINALYSIS DIPSTICK OB
Bilirubin, UA: NEGATIVE
Blood, UA: NEGATIVE
Glucose, UA: NEGATIVE
Ketones, UA: NEGATIVE
Leukocytes, UA: NEGATIVE
Nitrite, UA: NEGATIVE
Spec Grav, UA: 1.02 (ref 1.010–1.025)
Urobilinogen, UA: 0.2 E.U./dL
pH, UA: 7 (ref 5.0–8.0)

## 2019-08-06 MED ORDER — RHO D IMMUNE GLOBULIN 1500 UNIT/2ML IJ SOSY
300.0000 ug | PREFILLED_SYRINGE | Freq: Once | INTRAMUSCULAR | Status: AC
  Administered 2019-08-06: 300 ug via INTRAMUSCULAR

## 2019-08-06 MED ORDER — TETANUS-DIPHTH-ACELL PERTUSSIS 5-2.5-18.5 LF-MCG/0.5 IM SUSP
0.5000 mL | Freq: Once | INTRAMUSCULAR | Status: AC
  Administered 2019-08-06: 0.5 mL via INTRAMUSCULAR

## 2019-08-06 MED ORDER — PANTOPRAZOLE SODIUM 20 MG PO TBEC: 20.0000 mg | DELAYED_RELEASE_TABLET | Freq: Two times a day (BID) | ORAL | 3 refills | Status: DC

## 2019-08-06 NOTE — Progress Notes (Signed)
ROB-Pt present for routine prenatal care. Pt had rhogram, tdap and 1 hour gct. Pt stated having vaginal pain and upper abd pain.

## 2019-08-06 NOTE — Patient Instructions (Signed)
Third Trimester of Pregnancy The third trimester is from week 28 through week 40 (months 7 through 9). The third trimester is a time when the unborn baby (fetus) is growing rapidly. At the end of the ninth month, the fetus is about 20 inches in length and weighs 6-10 pounds. Body changes during your third trimester Your body will continue to go through many changes during pregnancy. The changes vary from woman to woman. During the third trimester:  Your weight will continue to increase. You can expect to gain 25-35 pounds (11-16 kg) by the end of the pregnancy.  You may begin to get stretch marks on your hips, abdomen, and breasts.  You may urinate more often because the fetus is moving lower into your pelvis and pressing on your bladder.  You may develop or continue to have heartburn. This is caused by increased hormones that slow down muscles in the digestive tract.  You may develop or continue to have constipation because increased hormones slow digestion and cause the muscles that push waste through your intestines to relax.  You may develop hemorrhoids. These are swollen veins (varicose veins) in the rectum that can itch or be painful.  You may develop swollen, bulging veins (varicose veins) in your legs.  You may have increased body aches in the pelvis, back, or thighs. This is due to weight gain and increased hormones that are relaxing your joints.  You may have changes in your hair. These can include thickening of your hair, rapid growth, and changes in texture. Some women also have hair loss during or after pregnancy, or hair that feels dry or thin. Your hair will most likely return to normal after your baby is born.  Your breasts will continue to grow and they will continue to become tender. A yellow fluid (colostrum) may leak from your breasts. This is the first milk you are producing for your baby.  Your belly button may stick out.  You may notice more swelling in your hands,  face, or ankles.  You may have increased tingling or numbness in your hands, arms, and legs. The skin on your belly may also feel numb.  You may feel short of breath because of your expanding uterus.  You may have more problems sleeping. This can be caused by the size of your belly, increased need to urinate, and an increase in your body's metabolism.  You may notice the fetus "dropping," or moving lower in your abdomen (lightening).  You may have increased vaginal discharge.  You may notice your joints feel loose and you may have pain around your pelvic bone. What to expect at prenatal visits You will have prenatal exams every 2 weeks until week 36. Then you will have weekly prenatal exams. During a routine prenatal visit:  You will be weighed to make sure you and the baby are growing normally.  Your blood pressure will be taken.  Your abdomen will be measured to track your baby's growth.  The fetal heartbeat will be listened to.  Any test results from the previous visit will be discussed.  You may have a cervical check near your due date to see if your cervix has softened or thinned (effaced).  You will be tested for Group B streptococcus. This happens between 35 and 37 weeks. Your health care provider may ask you:  What your birth plan is.  How you are feeling.  If you are feeling the baby move.  If you have had any abnormal   symptoms, such as leaking fluid, bleeding, severe headaches, or abdominal cramping.  If you are using any tobacco products, including cigarettes, chewing tobacco, and electronic cigarettes.  If you have any questions. Other tests or screenings that may be performed during your third trimester include:  Blood tests that check for low iron levels (anemia).  Fetal testing to check the health, activity level, and growth of the fetus. Testing is done if you have certain medical conditions or if there are problems during the pregnancy.  Nonstress test  (NST). This test checks the health of your baby to make sure there are no signs of problems, such as the baby not getting enough oxygen. During this test, a belt is placed around your belly. The baby is made to move, and its heart rate is monitored during movement. What is false labor? False labor is a condition in which you feel small, irregular tightenings of the muscles in the womb (contractions) that usually go away with rest, changing position, or drinking water. These are called Braxton Hicks contractions. Contractions may last for hours, days, or even weeks before true labor sets in. If contractions come at regular intervals, become more frequent, increase in intensity, or become painful, you should see your health care provider. What are the signs of labor?  Abdominal cramps.  Regular contractions that start at 10 minutes apart and become stronger and more frequent with time.  Contractions that start on the top of the uterus and spread down to the lower abdomen and back.  Increased pelvic pressure and dull back pain.  A watery or bloody mucus discharge that comes from the vagina.  Leaking of amniotic fluid. This is also known as your "water breaking." It could be a slow trickle or a gush. Let your health care provider know if it has a color or strange odor. If you have any of these signs, call your health care provider right away, even if it is before your due date. Follow these instructions at home: Medicines  Follow your health care provider's instructions regarding medicine use. Specific medicines may be either safe or unsafe to take during pregnancy.  Take a prenatal vitamin that contains at least 600 micrograms (mcg) of folic acid.  If you develop constipation, try taking a stool softener if your health care provider approves. Eating and drinking   Eat a balanced diet that includes fresh fruits and vegetables, whole grains, good sources of protein such as meat, eggs, or tofu,  and low-fat dairy. Your health care provider will help you determine the amount of weight gain that is right for you.  Avoid raw meat and uncooked cheese. These carry germs that can cause birth defects in the baby.  If you have low calcium intake from food, talk to your health care provider about whether you should take a daily calcium supplement.  Eat four or five small meals rather than three large meals a day.  Limit foods that are high in fat and processed sugars, such as fried and sweet foods.  To prevent constipation: ? Drink enough fluid to keep your urine clear or pale yellow. ? Eat foods that are high in fiber, such as fresh fruits and vegetables, whole grains, and beans. Activity  Exercise only as directed by your health care provider. Most women can continue their usual exercise routine during pregnancy. Try to exercise for 30 minutes at least 5 days a week. Stop exercising if you experience uterine contractions.  Avoid heavy lifting.  Do   not exercise in extreme heat or humidity, or at high altitudes.  Wear low-heel, comfortable shoes.  Practice good posture.  You may continue to have sex unless your health care provider tells you otherwise. Relieving pain and discomfort  Take frequent breaks and rest with your legs elevated if you have leg cramps or low back pain.  Take warm sitz baths to soothe any pain or discomfort caused by hemorrhoids. Use hemorrhoid cream if your health care provider approves.  Wear a good support bra to prevent discomfort from breast tenderness.  If you develop varicose veins: ? Wear support pantyhose or compression stockings as told by your healthcare provider. ? Elevate your feet for 15 minutes, 3-4 times a day. Prenatal care  Write down your questions. Take them to your prenatal visits.  Keep all your prenatal visits as told by your health care provider. This is important. Safety  Wear your seat belt at all times when driving.  Make  a list of emergency phone numbers, including numbers for family, friends, the hospital, and police and fire departments. General instructions  Avoid cat litter boxes and soil used by cats. These carry germs that can cause birth defects in the baby. If you have a cat, ask someone to clean the litter box for you.  Do not travel far distances unless it is absolutely necessary and only with the approval of your health care provider.  Do not use hot tubs, steam rooms, or saunas.  Do not drink alcohol.  Do not use any products that contain nicotine or tobacco, such as cigarettes and e-cigarettes. If you need help quitting, ask your health care provider.  Do not use any medicinal herbs or unprescribed drugs. These chemicals affect the formation and growth of the baby.  Do not douche or use tampons or scented sanitary pads.  Do not cross your legs for long periods of time.  To prepare for the arrival of your baby: ? Take prenatal classes to understand, practice, and ask questions about labor and delivery. ? Make a trial run to the hospital. ? Visit the hospital and tour the maternity area. ? Arrange for maternity or paternity leave through employers. ? Arrange for family and friends to take care of pets while you are in the hospital. ? Purchase a rear-facing car seat and make sure you know how to install it in your car. ? Pack your hospital bag. ? Prepare the baby's nursery. Make sure to remove all pillows and stuffed animals from the baby's crib to prevent suffocation.  Visit your dentist if you have not gone during your pregnancy. Use a soft toothbrush to brush your teeth and be gentle when you floss. Contact a health care provider if:  You are unsure if you are in labor or if your water has broken.  You become dizzy.  You have mild pelvic cramps, pelvic pressure, or nagging pain in your abdominal area.  You have lower back pain.  You have persistent nausea, vomiting, or  diarrhea.  You have an unusual or bad smelling vaginal discharge.  You have pain when you urinate. Get help right away if:  Your water breaks before 37 weeks.  You have regular contractions less than 5 minutes apart before 37 weeks.  You have a fever.  You are leaking fluid from your vagina.  You have spotting or bleeding from your vagina.  You have severe abdominal pain or cramping.  You have rapid weight loss or weight gain.  You have   shortness of breath with chest pain.  You notice sudden or extreme swelling of your face, hands, ankles, feet, or legs.  Your baby makes fewer than 10 movements in 2 hours.  You have severe headaches that do not go away when you take medicine.  You have vision changes. Summary  The third trimester is from week 28 through week 40, months 7 through 9. The third trimester is a time when the unborn baby (fetus) is growing rapidly.  During the third trimester, your discomfort may increase as you and your baby continue to gain weight. You may have abdominal, leg, and back pain, sleeping problems, and an increased need to urinate.  During the third trimester your breasts will keep growing and they will continue to become tender. A yellow fluid (colostrum) may leak from your breasts. This is the first milk you are producing for your baby.  False labor is a condition in which you feel small, irregular tightenings of the muscles in the womb (contractions) that eventually go away. These are called Braxton Hicks contractions. Contractions may last for hours, days, or even weeks before true labor sets in.  Signs of labor can include: abdominal cramps; regular contractions that start at 10 minutes apart and become stronger and more frequent with time; watery or bloody mucus discharge that comes from the vagina; increased pelvic pressure and dull back pain; and leaking of amniotic fluid. This information is not intended to replace advice given to you by your  health care provider. Make sure you discuss any questions you have with your health care provider. Document Revised: 06/21/2018 Document Reviewed: 04/05/2016 Elsevier Patient Education  2020 [employer]  

## 2019-08-06 NOTE — Progress Notes (Signed)
ROB: Complains of vaginal pain/abdominal pain. Noting [employer].  Notes heartburn not relieved by Tums. Will prescribe Protonix.  For 28 week labs today.  Desires to breastfeed,  Desires unsure method for contraception. For Tdap today, rhoGam given. Signed blood consent. Had positive Chlamydia testing last visit, for TOC today. RTC in 2 weeks.    Marland Kitchen

## 2019-08-07 LAB — GC/CHLAMYDIA PROBE AMP
Chlamydia trachomatis, NAA: NEGATIVE
Neisseria Gonorrhoeae by PCR: NEGATIVE

## 2019-08-07 LAB — CBC
Hematocrit: 36.4 % (ref 34.0–46.6)
Hemoglobin: 11.7 g/dL (ref 11.1–15.9)
MCH: 27.5 pg (ref 26.6–33.0)
MCHC: 32.1 g/dL (ref 31.5–35.7)
MCV: 85 fL (ref 79–97)
Platelets: 255 10*3/uL (ref 150–450)
RBC: 4.26 x10E6/uL (ref 3.77–5.28)
RDW: 12.8 % (ref 11.7–15.4)
WBC: 10.7 10*3/uL (ref 3.4–10.8)

## 2019-08-07 LAB — GLUCOSE, 1 HOUR GESTATIONAL: Gestational Diabetes Screen: 105 mg/dL (ref 65–139)

## 2019-08-07 LAB — RPR: RPR Ser Ql: NONREACTIVE

## 2019-08-16 ENCOUNTER — Other Ambulatory Visit: Payer: Self-pay

## 2019-08-16 ENCOUNTER — Ambulatory Visit (INDEPENDENT_AMBULATORY_CARE_PROVIDER_SITE_OTHER): Payer: Medicaid Other | Admitting: Obstetrics and Gynecology

## 2019-08-16 ENCOUNTER — Encounter: Payer: Self-pay | Admitting: Obstetrics and Gynecology

## 2019-08-16 VITALS — BP 110/73 | HR 102 | Wt 187.2 lb

## 2019-08-16 DIAGNOSIS — Z3A30 30 weeks gestation of pregnancy: Secondary | ICD-10-CM

## 2019-08-16 DIAGNOSIS — Z3483 Encounter for supervision of other normal pregnancy, third trimester: Secondary | ICD-10-CM

## 2019-08-16 NOTE — Progress Notes (Signed)
ROB: Patient says Protonix helping some but she continues to eat spicy foods so she still has heartburn.  Reports active fetal movement.  Wants to sign tubal papers at next visit.  Test of cure for chlamydia negative.

## 2019-08-19 DIAGNOSIS — F32A Depression, unspecified: Secondary | ICD-10-CM | POA: Insufficient documentation

## 2019-08-19 MED ORDER — FAMOTIDINE 20 MG/2ML IV SOLN
20.00 | INTRAVENOUS | Status: DC
Start: 2019-08-19 — End: 2019-08-19

## 2019-08-19 MED ORDER — RHO D IMMUNE GLOBULIN 1500 UNITS IM SOSY
300.00 | PREFILLED_SYRINGE | INTRAMUSCULAR | Status: DC
Start: ? — End: 2019-08-19

## 2019-08-19 MED ORDER — ONDANSETRON HCL 4 MG/2ML IJ SOLN
4.00 | INTRAMUSCULAR | Status: DC
Start: ? — End: 2019-08-19

## 2019-08-19 MED ORDER — BUTALBITAL-APAP-CAFFEINE 50-325-40 MG PO TABS
1.00 | ORAL_TABLET | ORAL | Status: DC
Start: ? — End: 2019-08-19

## 2019-08-30 ENCOUNTER — Encounter: Payer: Medicaid Other | Admitting: Obstetrics and Gynecology

## 2019-08-30 ENCOUNTER — Ambulatory Visit (INDEPENDENT_AMBULATORY_CARE_PROVIDER_SITE_OTHER): Payer: Medicaid Other | Admitting: Obstetrics and Gynecology

## 2019-08-30 ENCOUNTER — Encounter: Payer: Self-pay | Admitting: Obstetrics and Gynecology

## 2019-08-30 ENCOUNTER — Other Ambulatory Visit: Payer: Self-pay

## 2019-08-30 VITALS — BP 97/67 | HR 94 | Wt 190.0 lb

## 2019-08-30 DIAGNOSIS — O4693 Antepartum hemorrhage, unspecified, third trimester: Secondary | ICD-10-CM

## 2019-08-30 DIAGNOSIS — Z3A32 32 weeks gestation of pregnancy: Secondary | ICD-10-CM

## 2019-08-30 DIAGNOSIS — O26899 Other specified pregnancy related conditions, unspecified trimester: Secondary | ICD-10-CM

## 2019-08-30 DIAGNOSIS — O4703 False labor before 37 completed weeks of gestation, third trimester: Secondary | ICD-10-CM

## 2019-08-30 DIAGNOSIS — Z3483 Encounter for supervision of other normal pregnancy, third trimester: Secondary | ICD-10-CM

## 2019-08-30 DIAGNOSIS — Z6791 Unspecified blood type, Rh negative: Secondary | ICD-10-CM

## 2019-08-30 LAB — POCT URINALYSIS DIPSTICK OB
Bilirubin, UA: NEGATIVE
Blood, UA: NEGATIVE
Glucose, UA: NEGATIVE
Ketones, UA: NEGATIVE
Nitrite, UA: NEGATIVE
POC,PROTEIN,UA: NEGATIVE
Spec Grav, UA: 1.01 (ref 1.010–1.025)
Urobilinogen, UA: 0.2 E.U./dL
pH, UA: 7.5 (ref 5.0–8.0)

## 2019-08-30 NOTE — Progress Notes (Signed)
ROB-Pt present for routine prenatal care.  Pt stated that having lower/upper abd pain and pressure and has not notice anymore vaginal bleeding since her visit to the ED.

## 2019-08-30 NOTE — Progress Notes (Signed)
ROB: Patient feeling better.  Was seen in the Emergency Room at [facility] on 08/17/2019 due to complaints of vaginal bleeding.  Notes that they did not have an Obstetrics Unit so she was transferred to a different hospital in the [facility] area 2 hrs away (cannot recall the name of the second hospital).  She was admitted and watched overnight, and given a course of antenatal steroids due to bleeding and preterm contractions, and was ruled out for placental abruption.  Of note, patient had had intercourse within the 24 hours prior to her presentation. She also received another dose of Rhogam while in the hospital. Records reviewed in Care Everywhere.   Today notes that she has not had any further issues. Patient inquires into tubal ligation, as she is sure she does not desire any more children. Will have a boy and a girl after current pregnancy is completed.  Does not do well on hormonal birth control (mood changes and irregular heavy bleeding on several different methods in the past).  Discussed risks/benefits, nature of procedure.  Signed Medicaid sterilization form. Patient notes that she does not desire an epidural during her labor this time.Continue to encourage pelvic rest until 36 weeks pregnancy. Patient asks if it is ok to travel for the upcoming holiday, discussed short trips (2-3 hrs or less) were ok.    RTC in 2 weeks.    Hildred Laser, MD [facility]

## 2019-09-10 ENCOUNTER — Other Ambulatory Visit: Payer: Self-pay

## 2019-09-10 ENCOUNTER — Ambulatory Visit (INDEPENDENT_AMBULATORY_CARE_PROVIDER_SITE_OTHER): Payer: Medicaid Other | Admitting: Obstetrics and Gynecology

## 2019-09-10 ENCOUNTER — Encounter: Payer: Self-pay | Admitting: Obstetrics and Gynecology

## 2019-09-10 VITALS — BP 106/68 | HR 101 | Wt 188.3 lb

## 2019-09-10 DIAGNOSIS — Z3A33 33 weeks gestation of pregnancy: Secondary | ICD-10-CM

## 2019-09-10 DIAGNOSIS — Z3483 Encounter for supervision of other normal pregnancy, third trimester: Secondary | ICD-10-CM

## 2019-09-10 LAB — POCT URINALYSIS DIPSTICK OB
Bilirubin, UA: NEGATIVE
Blood, UA: NEGATIVE
Glucose, UA: NEGATIVE
Ketones, UA: NEGATIVE
Leukocytes, UA: NEGATIVE
Nitrite, UA: NEGATIVE
POC,PROTEIN,UA: NEGATIVE
Spec Grav, UA: 1.015 (ref 1.010–1.025)
Urobilinogen, UA: 0.2 E.U./dL
pH, UA: 7 (ref 5.0–8.0)

## 2019-09-10 NOTE — Progress Notes (Signed)
Patient comes in today for ROB visit. No concerns today. 

## 2019-09-10 NOTE — Progress Notes (Signed)
ROB: Patient denies further vaginal bleeding.  Has stopped intercourse.  She is planning a trip to the beach again.  She has discussed this with Dr. Valentino Saxon in some detail.  Risks of travel reviewed.

## 2019-09-12 ENCOUNTER — Encounter: Payer: Medicaid Other | Admitting: Obstetrics and Gynecology

## 2019-09-24 ENCOUNTER — Other Ambulatory Visit: Payer: Self-pay

## 2019-09-24 ENCOUNTER — Ambulatory Visit (INDEPENDENT_AMBULATORY_CARE_PROVIDER_SITE_OTHER): Payer: Medicaid Other | Admitting: Obstetrics and Gynecology

## 2019-09-24 ENCOUNTER — Encounter: Payer: Self-pay | Admitting: Obstetrics and Gynecology

## 2019-09-24 VITALS — BP 127/81 | HR 96 | Wt 195.4 lb

## 2019-09-24 DIAGNOSIS — Z3483 Encounter for supervision of other normal pregnancy, third trimester: Secondary | ICD-10-CM

## 2019-09-24 DIAGNOSIS — O4703 False labor before 37 completed weeks of gestation, third trimester: Secondary | ICD-10-CM

## 2019-09-24 DIAGNOSIS — Z3A35 35 weeks gestation of pregnancy: Secondary | ICD-10-CM

## 2019-09-24 LAB — POCT URINALYSIS DIPSTICK OB
Bilirubin, UA: NEGATIVE
Blood, UA: NEGATIVE
Glucose, UA: NEGATIVE
Ketones, UA: NEGATIVE
Leukocytes, UA: NEGATIVE
Nitrite, UA: NEGATIVE
POC,PROTEIN,UA: NEGATIVE
Spec Grav, UA: <=1.005 — AB (ref 1.010–1.025)
Urobilinogen, UA: 0.2 E.U./dL
pH, UA: 7 (ref 5.0–8.0)

## 2019-09-24 LAB — OB RESULTS CONSOLE GC/CHLAMYDIA: Gonorrhea: NEGATIVE

## 2019-09-24 NOTE — Progress Notes (Signed)
ROB: Patient complains of pelvic pain and contractions that began yesterday, slowly intensifying today. Currently 3-4 min apart. Of note, had arrested PTL at 30 weeks, received course of antenatal steroids and Rhogam.  Cervix unchanged, given PTL precautions. Advised on use of Benadryl/Vistaril, warm baths, increasing hydration, pelvic rest. 36 week cultures done today. RTC in 1 week.

## 2019-09-24 NOTE — Progress Notes (Signed)
ROB-Pt present for routine prenatal care. Pt c/o sharp vaginal pain and contractions started yesterday 3-4 minutes apart sharp pain. 36 week cultures completed today.

## 2019-09-24 NOTE — Patient Instructions (Signed)

## 2019-09-26 LAB — GC/CHLAMYDIA PROBE AMP
Chlamydia trachomatis, NAA: NEGATIVE
Neisseria Gonorrhoeae by PCR: NEGATIVE

## 2019-09-26 LAB — STREP GP B NAA: Strep Gp B NAA: NEGATIVE

## 2019-10-02 ENCOUNTER — Encounter: Payer: Self-pay | Admitting: Obstetrics and Gynecology

## 2019-10-02 ENCOUNTER — Other Ambulatory Visit: Payer: Self-pay

## 2019-10-02 ENCOUNTER — Ambulatory Visit (INDEPENDENT_AMBULATORY_CARE_PROVIDER_SITE_OTHER): Payer: Medicaid Other | Admitting: Obstetrics and Gynecology

## 2019-10-02 VITALS — BP 108/74 | HR 108 | Wt 200.9 lb

## 2019-10-02 DIAGNOSIS — Z3483 Encounter for supervision of other normal pregnancy, third trimester: Secondary | ICD-10-CM

## 2019-10-02 DIAGNOSIS — Z3A36 36 weeks gestation of pregnancy: Secondary | ICD-10-CM

## 2019-10-02 NOTE — Progress Notes (Signed)
ROB: Signs and symptoms of labor discussed.  Postpartum tubal discussed.  Patient says contractions still the same as 2 weeks ago.  Cultures reviewed.

## 2019-10-03 ENCOUNTER — Telehealth: Payer: Self-pay | Admitting: Obstetrics and Gynecology

## 2019-10-03 NOTE — Telephone Encounter (Signed)
Spoke with patient and let her know that Dr. Valentino Saxon wants her to take a tylenol lay down to rest. I told her to stay well hydrated. If her symptoms get worse to go to L&D to be checked. Patient verbalized understanding.

## 2019-10-03 NOTE — Telephone Encounter (Signed)
Pt called in and stated that her stomach feels tight, also she  said that she is having some cramps, and a headache and her feet for swollen. I put pt on hold and Sent JW a chat to see if she wanted to talk to her now or send a message. JW said to send a message. The pt is requesting a call back. Please advise

## 2019-10-09 ENCOUNTER — Inpatient Hospital Stay: Payer: Medicaid Other

## 2019-10-09 ENCOUNTER — Encounter: Payer: Self-pay | Admitting: Obstetrics and Gynecology

## 2019-10-09 ENCOUNTER — Observation Stay
Admission: EM | Admit: 2019-10-09 | Discharge: 2019-10-09 | Disposition: A | Discharge status: Home / Self Care | Payer: Medicaid Other | Attending: Obstetrics and Gynecology | Admitting: Obstetrics and Gynecology

## 2019-10-09 DIAGNOSIS — R600 Localized edema: Secondary | ICD-10-CM | POA: Insufficient documentation

## 2019-10-09 DIAGNOSIS — Z3490 Encounter for supervision of normal pregnancy, unspecified, unspecified trimester: Secondary | ICD-10-CM

## 2019-10-09 DIAGNOSIS — M79604 Pain in right leg: Secondary | ICD-10-CM | POA: Insufficient documentation

## 2019-10-09 DIAGNOSIS — Z3A37 37 weeks gestation of pregnancy: Secondary | ICD-10-CM | POA: Insufficient documentation

## 2019-10-09 DIAGNOSIS — M7989 Other specified soft tissue disorders: Secondary | ICD-10-CM

## 2019-10-09 DIAGNOSIS — O26893 Other specified pregnancy related conditions, third trimester: Principal | ICD-10-CM | POA: Insufficient documentation

## 2019-10-09 NOTE — Discharge Instructions (Signed)

## 2019-10-09 NOTE — OB Triage Note (Addendum)
Recvd pt from ED. Pt states she noticed swelling in noth feet about one week ago. States she started cramping in right leg about 2-3 days ago and is now having sharp pain. Right leg is slightly more swollen than the left leg. No headache, no blurred/spotty vision, no epigastric pain. VS WNL. States contractions come and go, but nothing too painful. No intercourse in the past 24 hours. No LOF or vaginal bleeding. Feeling baby move well. Pt states that she feels like if someone would just pul her leg and stretch it, it would feel better.

## 2019-10-09 NOTE — Progress Notes (Signed)
Discharged instructions and labor precautions discussed with pt. All questions answered. Instructed to go to next scheduled appt. Pt discharged home with SO.

## 2019-10-10 NOTE — Discharge Summary (Signed)
    L&D OB Triage Note  SUBJECTIVE Sandra Rivera is a 24 y.o. G2P1001 female at [redacted]w[redacted]d, EDD Estimated Date of Delivery: 10/25/19 who presented to triage with complaints of leg swelling. Pt states she noticed swelling in both feet about one week ago. States she started cramping in right leg about 2-3 days ago and is now having sharp pain. Right leg is slightly more swollen than the left leg. No headache, no blurred/spotty vision, no epigastric pain.States contractions come and go, but nothing too painful. No intercourse in the past 24 hours. No LOF or vaginal bleeding. Feeling baby move well.    OB History  Gravida Para Term Preterm AB Living  2 1 1  0 0 1  SAB TAB Ectopic Multiple Live Births  0 0 0 0 1    # Outcome Date GA Lbr Len/2nd Weight Sex Delivery Anes PTL Lv  2 Current           1 Term 02/12/15 [redacted]w[redacted]d / 00:33 3800 g M Vag-Spont EPI  LIV     Name: Lahue,BOY Ovie     Apgar1: 9  Apgar5: 9    No medications prior to admission.     OBJECTIVE  Nursing Evaluation:   BP 122/81 (BP Location: Left Arm)   Pulse 105   Temp 98 F (36.7 C) (Oral)   Resp 16   Ht 5\' 6"  (1.676 m)   Wt 90.7 kg   LMP 01/21/2019 (Exact Date) Comment: Patient is on birth control patch and pill  BMI 32.28 kg/m    Findings:        reveals no evidence of DVT.     Baby reassuring on fetal monitor     Normal blood pressure      NST was performed and has been reviewed by me.  NST INTERPRETATION: Category I  Mode: External Baseline Rate (A): 125 bpm Variability: Moderate Accelerations: 15 x 15 Decelerations: None     Contraction Frequency (min): occasional UI (pt does not feel any contractions)  ASSESSMENT Impression:  1.  Pregnancy:  G2P1001 at [redacted]w[redacted]d , EDD Estimated Date of Delivery: 10/25/19 2.  Reassuring fetal and maternal status 3.  No evidence of DVT despite unilateral leg swelling  PLAN 1. Discussed current condition and above findings with patient and reassurance given.  All  questions answered. 2. Discharge home with standard labor precautions given to return to L&D or call the office for problems. 3. Continue routine prenatal care.

## 2019-10-11 ENCOUNTER — Encounter: Payer: Medicaid Other | Admitting: Obstetrics and Gynecology

## 2019-10-16 ENCOUNTER — Other Ambulatory Visit: Payer: Self-pay

## 2019-10-16 ENCOUNTER — Ambulatory Visit (INDEPENDENT_AMBULATORY_CARE_PROVIDER_SITE_OTHER): Payer: Medicaid Other | Admitting: Obstetrics and Gynecology

## 2019-10-16 ENCOUNTER — Encounter: Payer: Self-pay | Admitting: Obstetrics and Gynecology

## 2019-10-16 VITALS — BP 105/70 | HR 105 | Wt 209.0 lb

## 2019-10-16 DIAGNOSIS — Z3A38 38 weeks gestation of pregnancy: Secondary | ICD-10-CM

## 2019-10-16 DIAGNOSIS — Z3483 Encounter for supervision of other normal pregnancy, third trimester: Secondary | ICD-10-CM

## 2019-10-16 LAB — POCT URINALYSIS DIPSTICK OB
Bilirubin, UA: NEGATIVE
Blood, UA: NEGATIVE
Glucose, UA: NEGATIVE
Ketones, UA: NEGATIVE
Nitrite, UA: NEGATIVE
Spec Grav, UA: 1.015 (ref 1.010–1.025)
Urobilinogen, UA: 0.2 E.U./dL
pH, UA: 7 (ref 5.0–8.0)

## 2019-10-16 NOTE — Progress Notes (Signed)
ROB: Patient noting abdominal and pelvic pressure as well as vaginal pressure. Notes intermittent ctx but not painful. Reiterated labor precautions. Patient inquires as to what happens if she reaches her due date. Discussed IOL by 41 weeks.  RTC in 1 week.

## 2019-10-16 NOTE — Progress Notes (Signed)
Pt present for routine prenatal visit. Pt states feeling pressure in upper and lower abdominal area. Also, states feeling pressure vagina area.

## 2019-10-16 NOTE — Patient Instructions (Signed)

## 2019-10-17 ENCOUNTER — Inpatient Hospital Stay
Admission: EM | Admit: 2019-10-17 | Discharge: 2019-10-19 | DRG: 797 | Disposition: A | Discharge status: Home / Self Care | Payer: Medicaid Other | Attending: Obstetrics and Gynecology | Admitting: Obstetrics and Gynecology

## 2019-10-17 ENCOUNTER — Encounter: Payer: Self-pay | Admitting: Obstetrics and Gynecology

## 2019-10-17 ENCOUNTER — Other Ambulatory Visit: Payer: Self-pay

## 2019-10-17 DIAGNOSIS — O26893 Other specified pregnancy related conditions, third trimester: Principal | ICD-10-CM | POA: Diagnosis present

## 2019-10-17 DIAGNOSIS — Z302 Encounter for sterilization: Secondary | ICD-10-CM

## 2019-10-17 DIAGNOSIS — F32A Depression, unspecified: Secondary | ICD-10-CM | POA: Diagnosis present

## 2019-10-17 DIAGNOSIS — D563 Thalassemia minor: Secondary | ICD-10-CM | POA: Diagnosis present

## 2019-10-17 DIAGNOSIS — F419 Anxiety disorder, unspecified: Secondary | ICD-10-CM | POA: Diagnosis present

## 2019-10-17 DIAGNOSIS — Z6791 Unspecified blood type, Rh negative: Secondary | ICD-10-CM

## 2019-10-17 DIAGNOSIS — Z3A39 39 weeks gestation of pregnancy: Secondary | ICD-10-CM | POA: Diagnosis not present

## 2019-10-17 DIAGNOSIS — G43009 Migraine without aura, not intractable, without status migrainosus: Secondary | ICD-10-CM | POA: Diagnosis present

## 2019-10-17 DIAGNOSIS — O99344 Other mental disorders complicating childbirth: Secondary | ICD-10-CM | POA: Diagnosis present

## 2019-10-17 DIAGNOSIS — Z20822 Contact with and (suspected) exposure to covid-19: Secondary | ICD-10-CM | POA: Diagnosis present

## 2019-10-17 DIAGNOSIS — G43E09 Chronic migraine with aura, not intractable, without status migrainosus: Secondary | ICD-10-CM | POA: Diagnosis present

## 2019-10-17 DIAGNOSIS — O26899 Other specified pregnancy related conditions, unspecified trimester: Secondary | ICD-10-CM

## 2019-10-17 DIAGNOSIS — F329 Major depressive disorder, single episode, unspecified: Secondary | ICD-10-CM | POA: Diagnosis present

## 2019-10-17 DIAGNOSIS — Z3490 Encounter for supervision of normal pregnancy, unspecified, unspecified trimester: Secondary | ICD-10-CM

## 2019-10-17 DIAGNOSIS — F17210 Nicotine dependence, cigarettes, uncomplicated: Secondary | ICD-10-CM | POA: Diagnosis present

## 2019-10-17 DIAGNOSIS — O99334 Smoking (tobacco) complicating childbirth: Secondary | ICD-10-CM | POA: Diagnosis present

## 2019-10-17 DIAGNOSIS — O360130 Maternal care for anti-D [Rh] antibodies, third trimester, not applicable or unspecified: Secondary | ICD-10-CM | POA: Diagnosis not present

## 2019-10-17 DIAGNOSIS — O99354 Diseases of the nervous system complicating childbirth: Secondary | ICD-10-CM | POA: Diagnosis present

## 2019-10-17 DIAGNOSIS — G43709 Chronic migraine without aura, not intractable, without status migrainosus: Secondary | ICD-10-CM | POA: Diagnosis present

## 2019-10-17 DIAGNOSIS — O99343 Other mental disorders complicating pregnancy, third trimester: Secondary | ICD-10-CM | POA: Diagnosis not present

## 2019-10-17 DIAGNOSIS — O99013 Anemia complicating pregnancy, third trimester: Secondary | ICD-10-CM | POA: Diagnosis not present

## 2019-10-17 MED ORDER — LACTATED RINGERS IV SOLN: 500.0000 mL | INTRAVENOUS | Status: DC | PRN

## 2019-10-17 MED ORDER — LACTATED RINGERS IV SOLN: INTRAVENOUS | Status: DC

## 2019-10-17 MED ORDER — SOD CITRATE-CITRIC ACID 500-334 MG/5ML PO SOLN: 30.0000 mL | ORAL | Status: DC | PRN

## 2019-10-17 MED ORDER — OXYCODONE-ACETAMINOPHEN 5-325 MG PO TABS: 1.0000 | ORAL_TABLET | ORAL | Status: DC | PRN

## 2019-10-17 MED ORDER — LIDOCAINE HCL (PF) 1 % IJ SOLN
30.0000 mL | INTRAMUSCULAR | Status: DC | PRN
  Filled 2019-10-17: qty 30

## 2019-10-17 MED ORDER — BUTORPHANOL TARTRATE 1 MG/ML IJ SOLN: 1.0000 mg | INTRAMUSCULAR | Status: DC | PRN

## 2019-10-17 MED ORDER — OXYCODONE-ACETAMINOPHEN 5-325 MG PO TABS: 2.0000 | ORAL_TABLET | ORAL | Status: DC | PRN

## 2019-10-17 MED ORDER — OXYTOCIN-SODIUM CHLORIDE 30-0.9 UT/500ML-% IV SOLN
2.5000 [IU]/h | INTRAVENOUS | Status: DC
  Filled 2019-10-17: qty 1000

## 2019-10-17 MED ORDER — ACETAMINOPHEN 325 MG PO TABS: 650.0000 mg | ORAL_TABLET | ORAL | Status: DC | PRN

## 2019-10-17 MED ORDER — OXYTOCIN BOLUS FROM INFUSION: 333.0000 mL | Freq: Once | INTRAVENOUS | Status: DC

## 2019-10-17 MED ORDER — ONDANSETRON HCL 4 MG/2ML IJ SOLN: 4.0000 mg | Freq: Four times a day (QID) | INTRAMUSCULAR | Status: DC | PRN

## 2019-10-17 NOTE — OB Triage Note (Signed)
Pt is 24 yo G2P1 at 38.6 weeks presenting with UCs since MD visit yesterday. Increased UCs x last 45 min Q2-6 min. Pt states was 3.5cm in office yesterday. PTL around 30 weeks admitted in [place] for 3 days. No acute distress at present. FHR 145

## 2019-10-18 ENCOUNTER — Encounter: Payer: Self-pay | Admitting: Obstetrics and Gynecology

## 2019-10-18 DIAGNOSIS — Z3A39 39 weeks gestation of pregnancy: Secondary | ICD-10-CM

## 2019-10-18 DIAGNOSIS — O360130 Maternal care for anti-D [Rh] antibodies, third trimester, not applicable or unspecified: Secondary | ICD-10-CM

## 2019-10-18 DIAGNOSIS — O99013 Anemia complicating pregnancy, third trimester: Secondary | ICD-10-CM

## 2019-10-18 DIAGNOSIS — D563 Thalassemia minor: Secondary | ICD-10-CM

## 2019-10-18 DIAGNOSIS — F329 Major depressive disorder, single episode, unspecified: Secondary | ICD-10-CM

## 2019-10-18 DIAGNOSIS — O99343 Other mental disorders complicating pregnancy, third trimester: Secondary | ICD-10-CM

## 2019-10-18 DIAGNOSIS — F419 Anxiety disorder, unspecified: Secondary | ICD-10-CM

## 2019-10-18 LAB — TYPE AND SCREEN
ABO/RH(D): B NEG
Antibody Screen: POSITIVE

## 2019-10-18 LAB — RPR: RPR Ser Ql: NONREACTIVE

## 2019-10-18 LAB — CBC
HCT: 34.6 % — ABNORMAL LOW (ref 36.0–46.0)
HCT: 35.3 % — ABNORMAL LOW (ref 36.0–46.0)
Hemoglobin: 11.7 g/dL — ABNORMAL LOW (ref 12.0–15.0)
Hemoglobin: 11.8 g/dL — ABNORMAL LOW (ref 12.0–15.0)
MCH: 26.6 pg (ref 26.0–34.0)
MCH: 26.8 pg (ref 26.0–34.0)
MCHC: 33.4 g/dL (ref 30.0–36.0)
MCHC: 33.8 g/dL (ref 30.0–36.0)
MCV: 79.2 fL — ABNORMAL LOW (ref 80.0–100.0)
MCV: 79.7 fL — ABNORMAL LOW (ref 80.0–100.0)
Platelets: 229 10*3/uL (ref 150–400)
Platelets: 234 10*3/uL (ref 150–400)
RBC: 4.37 MIL/uL (ref 3.87–5.11)
RBC: 4.43 MIL/uL (ref 3.87–5.11)
RDW: 13.5 % (ref 11.5–15.5)
RDW: 13.8 % (ref 11.5–15.5)
WBC: 12 10*3/uL — ABNORMAL HIGH (ref 4.0–10.5)
WBC: 13.3 10*3/uL — ABNORMAL HIGH (ref 4.0–10.5)
nRBC: 0 % (ref 0.0–0.2)
nRBC: 0 % (ref 0.0–0.2)

## 2019-10-18 LAB — SARS CORONAVIRUS 2 BY RT PCR (HOSPITAL ORDER, PERFORMED IN ~~LOC~~ HOSPITAL LAB): SARS Coronavirus 2: NEGATIVE

## 2019-10-18 MED ORDER — LACTATED RINGERS IV SOLN: 125.0000 mL/h | INTRAVENOUS | Status: DC

## 2019-10-18 MED ORDER — LACTATED RINGERS IV SOLN
INTRAVENOUS | Status: DC
  Administered 2019-10-19: 20 mL/h via INTRAVENOUS

## 2019-10-18 MED ORDER — WITCH HAZEL-GLYCERIN EX PADS: 1.0000 "application " | MEDICATED_PAD | CUTANEOUS | Status: DC | PRN

## 2019-10-18 MED ORDER — SALINE SPRAY 0.65 % NA SOLN
1.0000 | NASAL | Status: DC | PRN
  Administered 2019-10-18: 1 via NASAL
  Filled 2019-10-18: qty 44

## 2019-10-18 MED ORDER — COCONUT OIL OIL: 1.0000 "application " | TOPICAL_OIL | Status: DC | PRN

## 2019-10-18 MED ORDER — BENZOCAINE-MENTHOL 20-0.5 % EX AERO
1.0000 "application " | INHALATION_SPRAY | CUTANEOUS | Status: DC | PRN
  Filled 2019-10-18: qty 56

## 2019-10-18 MED ORDER — IBUPROFEN 800 MG PO TABS
800.0000 mg | ORAL_TABLET | Freq: Four times a day (QID) | ORAL | Status: DC
  Administered 2019-10-18 – 2019-10-19 (×3): 800 mg via ORAL
  Filled 2019-10-18 (×3): qty 1

## 2019-10-18 MED ORDER — DIBUCAINE (PERIANAL) 1 % EX OINT: 1.0000 "application " | TOPICAL_OINTMENT | CUTANEOUS | Status: DC | PRN

## 2019-10-18 MED ORDER — SIMETHICONE 80 MG PO CHEW: 80.0000 mg | CHEWABLE_TABLET | ORAL | Status: DC | PRN

## 2019-10-18 MED ORDER — BUTORPHANOL TARTRATE 1 MG/ML IJ SOLN
1.0000 mg | INTRAMUSCULAR | Status: DC | PRN
  Administered 2019-10-18: 1 mg via INTRAVENOUS
  Filled 2019-10-18: qty 1

## 2019-10-18 MED ORDER — IBUPROFEN 800 MG PO TABS: 800.0000 mg | ORAL_TABLET | Freq: Four times a day (QID) | ORAL | Status: DC

## 2019-10-18 MED ORDER — OXYTOCIN 10 UNIT/ML IJ SOLN
INTRAMUSCULAR | Status: AC
  Filled 2019-10-18: qty 2

## 2019-10-18 MED ORDER — BUTALBITAL-APAP-CAFFEINE 50-325-40 MG PO TABS: 1.0000 | ORAL_TABLET | Freq: Four times a day (QID) | ORAL | Status: DC | PRN

## 2019-10-18 MED ORDER — ZOLPIDEM TARTRATE 5 MG PO TABS: 5.0000 mg | ORAL_TABLET | Freq: Every evening | ORAL | Status: DC | PRN

## 2019-10-18 MED ORDER — OXYTOCIN BOLUS FROM INFUSION
333.0000 mL | Freq: Once | INTRAVENOUS | Status: DC
  Administered 2019-10-18: 333 mL via INTRAVENOUS

## 2019-10-18 MED ORDER — BUTALBITAL-APAP-CAFFEINE 50-325-40 MG PO TABS
1.0000 | ORAL_TABLET | Freq: Four times a day (QID) | ORAL | Status: DC | PRN
  Administered 2019-10-18: 1 via ORAL
  Filled 2019-10-18 (×2): qty 1

## 2019-10-18 MED ORDER — LACTATED RINGERS IV BOLUS: 1000.0000 mL | Freq: Once | INTRAVENOUS | Status: DC

## 2019-10-18 MED ORDER — ONDANSETRON HCL 4 MG/2ML IJ SOLN: 4.0000 mg | Freq: Four times a day (QID) | INTRAMUSCULAR | Status: DC | PRN

## 2019-10-18 MED ORDER — LIDOCAINE HCL (PF) 1 % IJ SOLN: 30.0000 mL | INTRAMUSCULAR | Status: DC | PRN

## 2019-10-18 MED ORDER — OXYCODONE-ACETAMINOPHEN 5-325 MG PO TABS: 2.0000 | ORAL_TABLET | ORAL | Status: DC | PRN

## 2019-10-18 MED ORDER — FAMOTIDINE 20 MG PO TABS
40.0000 mg | ORAL_TABLET | Freq: Once | ORAL | Status: AC
  Administered 2019-10-19: 40 mg via ORAL
  Filled 2019-10-18: qty 2

## 2019-10-18 MED ORDER — ACETAMINOPHEN 325 MG PO TABS: 650.0000 mg | ORAL_TABLET | ORAL | Status: DC | PRN

## 2019-10-18 MED ORDER — DIPHENHYDRAMINE HCL 25 MG PO CAPS: 25.0000 mg | ORAL_CAPSULE | Freq: Four times a day (QID) | ORAL | Status: DC | PRN

## 2019-10-18 MED ORDER — OXYTOCIN-SODIUM CHLORIDE 30-0.9 UT/500ML-% IV SOLN
2.5000 [IU]/h | INTRAVENOUS | Status: DC
  Administered 2019-10-18: 2.5 [IU]/h via INTRAVENOUS

## 2019-10-18 MED ORDER — AMMONIA AROMATIC IN INHA
RESPIRATORY_TRACT | Status: AC
  Filled 2019-10-18: qty 10

## 2019-10-18 MED ORDER — PRENATAL MULTIVITAMIN CH
1.0000 | ORAL_TABLET | Freq: Every day | ORAL | Status: DC
  Administered 2019-10-18 – 2019-10-19 (×2): 1 via ORAL
  Filled 2019-10-18 (×2): qty 1

## 2019-10-18 MED ORDER — ACETAMINOPHEN 325 MG PO TABS
650.0000 mg | ORAL_TABLET | ORAL | Status: DC | PRN
  Administered 2019-10-18: 650 mg via ORAL
  Filled 2019-10-18: qty 2

## 2019-10-18 MED ORDER — METOCLOPRAMIDE HCL 10 MG PO TABS
10.0000 mg | ORAL_TABLET | Freq: Once | ORAL | Status: AC
  Administered 2019-10-19: 10 mg via ORAL
  Filled 2019-10-18: qty 1

## 2019-10-18 MED ORDER — SOD CITRATE-CITRIC ACID 500-334 MG/5ML PO SOLN: 30.0000 mL | ORAL | Status: DC | PRN

## 2019-10-18 MED ORDER — LACTATED RINGERS IV SOLN: 500.0000 mL | INTRAVENOUS | Status: DC | PRN

## 2019-10-18 MED ORDER — ONDANSETRON HCL 4 MG/2ML IJ SOLN: 4.0000 mg | INTRAMUSCULAR | Status: DC | PRN

## 2019-10-18 MED ORDER — MISOPROSTOL 200 MCG PO TABS
ORAL_TABLET | ORAL | Status: AC
  Filled 2019-10-18: qty 4

## 2019-10-18 MED ORDER — OXYCODONE-ACETAMINOPHEN 5-325 MG PO TABS
1.0000 | ORAL_TABLET | Freq: Four times a day (QID) | ORAL | Status: DC | PRN
  Administered 2019-10-18: 1 via ORAL
  Administered 2019-10-19 (×4): 2 via ORAL
  Filled 2019-10-18: qty 2
  Filled 2019-10-18: qty 1
  Filled 2019-10-18 (×3): qty 2

## 2019-10-18 MED ORDER — SENNOSIDES-DOCUSATE SODIUM 8.6-50 MG PO TABS: 2.0000 | ORAL_TABLET | ORAL | Status: DC

## 2019-10-18 MED ORDER — OXYCODONE-ACETAMINOPHEN 5-325 MG PO TABS: 1.0000 | ORAL_TABLET | ORAL | Status: DC | PRN

## 2019-10-18 MED ORDER — ONDANSETRON HCL 4 MG PO TABS
4.0000 mg | ORAL_TABLET | ORAL | Status: DC | PRN
  Filled 2019-10-18: qty 1

## 2019-10-18 MED ORDER — IBUPROFEN 800 MG PO TABS
ORAL_TABLET | ORAL | Status: AC
  Administered 2019-10-18: 800 mg
  Filled 2019-10-18: qty 1

## 2019-10-18 NOTE — Lactation Note (Addendum)
This note was copied from a baby's chart. Lactation Consultation Note  Patient Name: Sandra Rivera Date: 10/18/2019 Reason for consult: Initial assessment;1st time breastfeeding   Maternal Data Formula Feeding for Exclusion: No Does the patient have breastfeeding experience prior to this delivery?: Yes  Feeding Feeding Type: Breast Fed Baby breastfeeding on 2nd breast when room entered, baby rooting when she comes off breast LATCH Score Latch: Grasps breast easily, tongue down, lips flanged, rhythmical sucking.  Audible Swallowing: A few with stimulation  Type of Nipple: Everted at rest and after stimulation  Comfort (Breast/Nipple): Soft / non-tender  Hold (Positioning): No assistance needed to correctly position infant at breast.  LATCH Score: 9  Interventions Interventions: Breast feeding basics reviewed;Skin to skin  Lactation Tools Discussed/Used WIC Program: Yes   Consult Status Consult Status: Follow-up Date: 10/18/19 Follow-up type: In-patient    [name] 10/18/2019, 12:31 PM
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
## 2019-10-19 ENCOUNTER — Inpatient Hospital Stay: Payer: Medicaid Other | Admitting: Anesthesiology

## 2019-10-19 ENCOUNTER — Encounter: Payer: Self-pay | Admitting: Obstetrics and Gynecology

## 2019-10-19 ENCOUNTER — Ambulatory Visit: Payer: Self-pay

## 2019-10-19 ENCOUNTER — Encounter
Admission: EM | Disposition: A | Discharge status: Home / Self Care | Payer: Self-pay | Source: Home / Self Care | Attending: Obstetrics and Gynecology

## 2019-10-19 DIAGNOSIS — Z302 Encounter for sterilization: Secondary | ICD-10-CM

## 2019-10-19 HISTORY — PX: TUBAL LIGATION: SHX77

## 2019-10-19 LAB — CBC
HCT: 33.7 % — ABNORMAL LOW (ref 36.0–46.0)
Hemoglobin: 10.8 g/dL — ABNORMAL LOW (ref 12.0–15.0)
MCH: 26.1 pg (ref 26.0–34.0)
MCHC: 32 g/dL (ref 30.0–36.0)
MCV: 81.4 fL (ref 80.0–100.0)
Platelets: 193 10*3/uL (ref 150–400)
RBC: 4.14 MIL/uL (ref 3.87–5.11)
RDW: 13.9 % (ref 11.5–15.5)
WBC: 9 10*3/uL (ref 4.0–10.5)
nRBC: 0 % (ref 0.0–0.2)

## 2019-10-19 LAB — FETAL SCREEN: Fetal Screen: NEGATIVE

## 2019-10-19 SURGERY — LIGATION, FALLOPIAN TUBE, POSTPARTUM
Anesthesia: General | Laterality: Bilateral

## 2019-10-19 MED ORDER — ONDANSETRON HCL 4 MG/2ML IJ SOLN
INTRAMUSCULAR | Status: AC
  Filled 2019-10-19: qty 2

## 2019-10-19 MED ORDER — MIDAZOLAM HCL 2 MG/2ML IJ SOLN
INTRAMUSCULAR | Status: DC | PRN
  Administered 2019-10-19: 2 mg via INTRAVENOUS

## 2019-10-19 MED ORDER — OXYCODONE HCL 5 MG/5ML PO SOLN: 5.0000 mg | Freq: Once | ORAL | Status: DC | PRN

## 2019-10-19 MED ORDER — FENTANYL CITRATE (PF) 100 MCG/2ML IJ SOLN
INTRAMUSCULAR | Status: AC
  Filled 2019-10-19: qty 2

## 2019-10-19 MED ORDER — DEXAMETHASONE SODIUM PHOSPHATE 10 MG/ML IJ SOLN
INTRAMUSCULAR | Status: AC
  Filled 2019-10-19: qty 1

## 2019-10-19 MED ORDER — MIDAZOLAM HCL 2 MG/2ML IJ SOLN
INTRAMUSCULAR | Status: AC
  Filled 2019-10-19: qty 2

## 2019-10-19 MED ORDER — DOCUSATE SODIUM 100 MG PO CAPS
100.0000 mg | ORAL_CAPSULE | Freq: Every day | ORAL | 0 refills | Status: DC | PRN
Start: 2019-10-19 — End: 2019-12-05

## 2019-10-19 MED ORDER — ONDANSETRON HCL 4 MG/2ML IJ SOLN
INTRAMUSCULAR | Status: DC | PRN
  Administered 2019-10-19: 4 mg via INTRAVENOUS

## 2019-10-19 MED ORDER — SEVOFLURANE IN SOLN
RESPIRATORY_TRACT | Status: AC
  Filled 2019-10-19: qty 250

## 2019-10-19 MED ORDER — BUPIVACAINE HCL (PF) 0.5 % IJ SOLN
INTRAMUSCULAR | Status: AC
  Filled 2019-10-19: qty 30

## 2019-10-19 MED ORDER — KETOROLAC TROMETHAMINE 30 MG/ML IJ SOLN
INTRAMUSCULAR | Status: DC | PRN
  Administered 2019-10-19: 30 mg via INTRAVENOUS

## 2019-10-19 MED ORDER — PROPOFOL 10 MG/ML IV BOLUS
INTRAVENOUS | Status: AC
  Filled 2019-10-19: qty 20

## 2019-10-19 MED ORDER — SUGAMMADEX SODIUM 200 MG/2ML IV SOLN
INTRAVENOUS | Status: DC | PRN
  Administered 2019-10-19: 200 mg via INTRAVENOUS

## 2019-10-19 MED ORDER — LIDOCAINE HCL 4 % MT SOLN
OROMUCOSAL | Status: DC | PRN
  Administered 2019-10-19: 4 mL via TOPICAL

## 2019-10-19 MED ORDER — PROPOFOL 10 MG/ML IV BOLUS
INTRAVENOUS | Status: DC | PRN
  Administered 2019-10-19: 170 mg via INTRAVENOUS

## 2019-10-19 MED ORDER — OXYCODONE HCL 5 MG PO TABS: 5.0000 mg | ORAL_TABLET | Freq: Three times a day (TID) | ORAL | 0 refills | Status: DC | PRN

## 2019-10-19 MED ORDER — OXYCODONE HCL 5 MG PO TABS: 5.0000 mg | ORAL_TABLET | Freq: Once | ORAL | Status: DC | PRN

## 2019-10-19 MED ORDER — FENTANYL CITRATE (PF) 100 MCG/2ML IJ SOLN
INTRAMUSCULAR | Status: DC | PRN
  Administered 2019-10-19: 100 ug via INTRAVENOUS

## 2019-10-19 MED ORDER — BUPIVACAINE HCL 0.5 % IJ SOLN
INTRAMUSCULAR | Status: DC | PRN
  Administered 2019-10-19: 10 mL

## 2019-10-19 MED ORDER — DEXAMETHASONE SODIUM PHOSPHATE 10 MG/ML IJ SOLN
INTRAMUSCULAR | Status: DC | PRN
  Administered 2019-10-19: 10 mg via INTRAVENOUS

## 2019-10-19 MED ORDER — IBUPROFEN 800 MG PO TABS: 800.0000 mg | ORAL_TABLET | Freq: Three times a day (TID) | ORAL | 0 refills | Status: DC | PRN

## 2019-10-19 MED ORDER — KETOROLAC TROMETHAMINE 30 MG/ML IJ SOLN
INTRAMUSCULAR | Status: AC
  Filled 2019-10-19: qty 1

## 2019-10-19 MED ORDER — ROCURONIUM BROMIDE 100 MG/10ML IV SOLN
INTRAVENOUS | Status: DC | PRN
  Administered 2019-10-19: 50 mg via INTRAVENOUS

## 2019-10-19 MED ORDER — LIDOCAINE HCL (CARDIAC) PF 100 MG/5ML IV SOSY
PREFILLED_SYRINGE | INTRAVENOUS | Status: DC | PRN
  Administered 2019-10-19: 80 mg via INTRAVENOUS

## 2019-10-19 MED ORDER — FENTANYL CITRATE (PF) 100 MCG/2ML IJ SOLN
25.0000 ug | INTRAMUSCULAR | Status: DC | PRN
  Administered 2019-10-19 (×4): 50 ug via INTRAVENOUS

## 2019-10-19 MED ORDER — HYDROXYZINE HCL 25 MG PO TABS
25.0000 mg | ORAL_TABLET | Freq: Once | ORAL | Status: AC | PRN
  Administered 2019-10-19: 25 mg via ORAL
  Filled 2019-10-19 (×2): qty 1

## 2019-10-19 SURGICAL SUPPLY — 26 items
BLADE SURG SZ11 CARB STEEL (BLADE) ×3 IMPLANT
CHLORAPREP W/TINT 26 (MISCELLANEOUS) ×3 IMPLANT
CLIP FILSHIE TUBAL LIGA STRL (Clip) ×3 IMPLANT
COVER WAND RF STERILE (DRAPES) IMPLANT
DERMABOND ADVANCED (GAUZE/BANDAGES/DRESSINGS) ×2
DERMABOND ADVANCED .7 DNX12 (GAUZE/BANDAGES/DRESSINGS) ×1 IMPLANT
DRAPE LAPAROTOMY 100X77 ABD (DRAPES) ×3 IMPLANT
GLOVE BIO SURGEON STRL SZ 6.5 (GLOVE) ×2 IMPLANT
GLOVE BIO SURGEONS STRL SZ 6.5 (GLOVE) ×1
GLOVE INDICATOR 7.0 STRL GRN (GLOVE) ×3 IMPLANT
GOWN STRL REUS W/ TWL LRG LVL3 (GOWN DISPOSABLE) ×2 IMPLANT
GOWN STRL REUS W/TWL LRG LVL3 (GOWN DISPOSABLE) ×4
KIT TURNOVER CYSTO (KITS) ×3 IMPLANT
NEEDLE HYPO 25GX1X1/2 BEV (NEEDLE) ×3 IMPLANT
NS IRRIG 500ML POUR BTL (IV SOLUTION) ×3 IMPLANT
PACK BASIN MINOR (MISCELLANEOUS) ×3 IMPLANT
SUT MNCRL 4-0 (SUTURE) ×2
SUT MNCRL 4-0 27XMFL (SUTURE) ×1
SUT PLAIN GUT 0 (SUTURE) ×6 IMPLANT
SUT VIC AB 0 CT1 36 (SUTURE) IMPLANT
SUT VIC AB 0 SH 27 (SUTURE) IMPLANT
SUT VIC AB 3-0 SH 27 (SUTURE) ×2
SUT VIC AB 3-0 SH 27X BRD (SUTURE) ×1 IMPLANT
SUT VICRYL 0 AB UR-6 (SUTURE) ×6 IMPLANT
SUTURE MNCRL 4-0 27XMF (SUTURE) ×1 IMPLANT
SYR 10ML LL (SYRINGE) ×3 IMPLANT
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
## 2019-10-19 NOTE — Progress Notes (Addendum)
Post Partum Day # 1, s/p SVD  Subjective: no complaints, up ad lib, voiding and tolerating PO. Has been NPO since midnight.   Objective: Temp:  [97.7 F (36.5 C)-98.2 F (36.8 C)] 97.8 F (36.6 C) (08/07 0342) Pulse Rate:  [70-94] 82 (08/07 0342) Resp:  [18-20] 20 (08/07 0342) BP: (116-137)/(66-91) 120/77 (08/07 0342) SpO2:  [98 %-100 %] 98 % (08/07 0342)  Physical Exam:  General: alert and no distress  Lungs: clear to auscultation bilaterally Breasts: normal appearance, no masses or tenderness Heart: regular rate and rhythm, S1, S2 normal, no murmur, click, rub or gallop Abdomen: soft, non-tender; bowel sounds normal; no masses,  no organomegaly Pelvis: Lochia: appropriate, Uterine Fundus: firm Extremities: DVT Evaluation: No evidence of DVT seen on physical exam. Negative Homan's sign. No cords or calf tenderness. No significant calf/ankle edema.   Recent Labs    10/18/19 0015 10/18/19 0842  HGB 11.7* 11.8*  HCT 34.6* 35.3*     Edinburgh Score: Edinburgh Postnatal Depression Scale Screening Tool 10/19/2019  I have been able to laugh and see the funny side of things. 0  I have looked forward with enjoyment to things. 1  I have blamed myself unnecessarily when things went wrong. 3  I have been anxious or worried for no good reason. 2  I have felt scared or panicky for no good reason. 2  Things have been getting on top of me. 3  I have been so unhappy that I have had difficulty sleeping. 2  I have felt sad or miserable. 1  I have been so unhappy that I have been crying. 2  The thought of harming myself has occurred to me. 0  Edinburgh Postnatal Depression Scale Total 16        Assessment/Plan: - Doing well postpartum  - Breastfeeding, Lactation consult as needed - Contraception: postpartum BTL, scheduled for this morning.Other reversible forms of contraception were discussed with patient; she declines all other modalities. Risks of procedure discussed with  patient including but not limited to: risk of regret, permanence of method, bleeding, infection, injury to surrounding organs and need for additional procedures.  Failure risk of about 1% with increased risk of ectopic gestation if pregnancy occurs was also discussed with patient.  Also discussed possibility of post-tubal pain syndrome. Patient verbalized understanding of these risks and wants to proceed with sterilization.  Written informed consent obtained.  To OR when ready. - Continue Fioricet for migraines as needed - Rh negative, for Rhogam postpartum if indicated.  - Social work consult for elevated [place] screen.  - Plan for discharge home later today.    LOS: 2 days   Hildred Laser, MD [facility] 10/19/2019 7:40 AM

## 2019-10-19 NOTE — Op Note (Signed)
Procedure(s): POST PARTUM TUBAL LIGATION Filshie Clips Procedure Note  Sandra Rivera female 24 y.o. 10/19/2019  Indications: The patient is a 24 y.o. G36P2002 female with undesired fertility.  Is PPD#1 s/p NSVD.  Patient desires permanent sterilization.  Other reversible forms of contraception were discussed with patient; she declines all other modalities. Risks of procedure discussed with patient including but not limited to: risk of regret, permanence of method, bleeding, infection, injury to surrounding organs and need for additional procedures.  Failure risk of about 1% with increased risk of ectopic gestation if pregnancy occurs was also discussed with patient.  Also discussed possibility of post-tubal pain syndrome. Patient verbalized understanding of these risks and wants to proceed with sterilization.  Written informed consent obtained.    Pre-operative Diagnosis:  Multiparity, undesired fertility  Post-operative Diagnosis: Same  Surgeon: Hildred Laser, MD  Assistants:  April Greissinger, [place] PA-S  Anesthesia: General endotracheal anesthesia  Findings: Uterus, fallopian tubes and ovaries appeared normal.   Procedure Details: The patient was seen in the Holding Room. The risks, benefits, complications, treatment options, and expected outcomes were discussed with the patient.  The patient concurred with the proposed plan, giving informed consent.  The site of surgery properly noted/marked. The patient was taken to the Operating Room, identified as Sandra Rivera and the procedure verified as Procedure(s) (LRB): POST PARTUM TUBAL LIGATION Filshie Clips (Bilateral). A Time Out was held and the above information confirmed.  She was then placed under general anesthesia without difficulty. She was then placed in the dorsal supine position and prepped and draped in sterile fashion.  After an adequate timeout was performed, attention was turned to the patient's abdomen where a small transverse  skin incision was made under the umbilical fold. The incision was taken down to the layer of fascia using the scalpel, and fascia was incised, and extended bilaterally using Mayo scissors. The peritoneum was entered in a sharp fashion. Attention was then turned to the patient's uterus, and left fallopian tube was identified and followed out to the fimbriated end.  A Filshie clip was placed on the left fallopian tube about 3 cm from the cornual attachment, with care given to incorporate the underlying mesosalpinx.  A similar process was carried out on the right side allowing for bilateral tubal sterilization.  Good hemostasis was noted overall.The instruments were then removed from the patient's abdomen and the fascial incision was repaired with 0 Vicryl, and the skin was closed with a 4-0 Vicryl subcuticular stitch. The skin was injected with a total of 10 cc of 0.5% Marcaine. The patient tolerated the procedure well.  Instrument, sponge, and needle counts were correct times three.  The patient was then taken to the recovery room awake and in stable condition.   Estimated Blood Loss:        Drains: patient voided prior to procedure.          Total IV Fluids:  700 ml  Specimens: None         Implants: None         Complications:  None; patient tolerated the procedure well.         Disposition: PACU - hemodynamically stable.         Condition: stable   Hildred Laser, MD [facility]

## 2019-10-19 NOTE — Discharge Summary (Addendum)
Postpartum Discharge Summary      Patient Name: Sandra Rivera DOB: February 07, 1996 MRN: [id number]  Date of admission: 10/17/2019 Delivery date:10/18/2019  Delivering provider: [name]  Date of discharge: 10/19/2019  Admitting diagnosis: Term pregnancy [Z34.90] Labor and delivery indication for care or intervention [O75.9] Intrauterine pregnancy: [redacted]w[redacted]d    Secondary diagnosis:  Active Problems:   Migraine without aura   Anxiety and depression   Rh negative state in antepartum period   Alpha thalassemia trait   Term pregnancy   Labor and delivery indication for care or intervention  Additional problems: None    Discharge diagnosis: Term Pregnancy Delivered                                              Post partum procedures:postpartum tubal ligation. Patient declined Rhogam administration.  Augmentation: AROM Complications: None  Hospital course: Onset of Labor With Vaginal Delivery      24y.o. yo [id number] [id number] admitted in Active Labor on 10/17/2019. Patient had an uncomplicated labor course as follows:  Membrane Rupture Time/Date: 7:31 AM ,10/18/2019   Delivery Method:Vaginal, Spontaneous  Episiotomy: None  Lacerations:  None  Patient had an uncomplicated postpartum course.  She is ambulating, tolerating a regular diet, passing flatus, and urinating well. Patient is discharged home in stable condition on 10/19/19.  Newborn Data: Birth date:10/18/2019  Birth time:10:12 AM  Gender:Female  Living status:Living  Apgars:8 ,9  Weight:2920 g   Magnesium Sulfate received: No BMZ received: Yes (at [redacted] weeks gestation for PTL) Rhophylac:Yes MMR:No T-DaP:Given prenatally Flu: No Transfusion:No  Physical exam  Vitals:   10/19/19 0951 10/19/19 0955 10/19/19 1000 10/19/19 1003  BP: 132/82   (!) 143/94  Pulse: 81 77 94 78  Resp: '14 10 10 13  ' Temp:  (!) 97.4 F (36.3 C)    TempSrc:      SpO2: 94% 94% 94% 95%  Weight:      Height:       General: alert and no  distress Lochia: appropriate Uterine Fundus: firm Incision: N/A DVT Evaluation: No evidence of DVT seen on physical exam. Negative Homan's sign. No cords or calf tenderness. No significant calf/ankle edema.   Labs: Lab Results  Component Value Date   WBC 9.0 10/19/2019   HGB 10.8 (L) 10/19/2019   HCT 33.7 (L) 10/19/2019   MCV 81.4 10/19/2019   PLT 193 10/19/2019    CMP Latest Ref Rng & Units 09/03/2018  Glucose 70 - 99 mg/dL 125(H)  BUN 6 - 20 mg/dL 12  Creatinine 0.44 - 1.00 mg/dL 0.85  Sodium 135 - 145 mmol/L 138  Potassium 3.5 - 5.1 mmol/L 3.2(L)  Chloride 98 - 111 mmol/L 102  CO2 22 - 32 mmol/L 22  Calcium 8.9 - 10.3 mg/dL 8.9  Total Protein 6.5 - 8.1 g/dL 7.2  Total Bilirubin 0.3 - 1.2 mg/dL 0.6  Alkaline Phos 38 - 126 U/L 54  AST 15 - 41 U/L 22  ALT 0 - 44 U/L 20   Edinburgh Score: Edinburgh Postnatal Depression Scale Screening Tool 10/19/2019  I have been able to laugh and see the funny side of things. 0  I have looked forward with enjoyment to things. 1  I have blamed myself unnecessarily when things went wrong. 3  I have been anxious or worried for no good reason.  2  I have felt scared or panicky for no good reason. 2  Things have been getting on top of me. 3  I have been so unhappy that I have had difficulty sleeping. 2  I have felt sad or miserable. 1  I have been so unhappy that I have been crying. 2  The thought of harming myself has occurred to me. 0  Edinburgh Postnatal Depression Scale Total 16      After visit meds:  Allergies as of 10/19/2019   No Known Allergies     Medication List    STOP taking these medications   ondansetron 4 MG disintegrating tablet Commonly known as: ZOFRAN-ODT     TAKE these medications   butalbital-acetaminophen-caffeine 50-325-40 MG tablet Commonly known as: FIORICET Take 1 tablet by mouth every 6 (six) hours as needed for headache or migraine.   docusate sodium 100 MG capsule Commonly known as: Colace Take  1 capsule (100 mg total) by mouth daily as needed for mild constipation.   ibuprofen 800 MG tablet Commonly known as: ADVIL Take 1 tablet (800 mg total) by mouth every 8 (eight) hours as needed.   multivitamin-prenatal 27-0.8 MG Tabs tablet Take 1 tablet by mouth daily at 12 noon.   oxyCODONE 5 MG immediate release tablet Commonly known as: Roxicodone Take 1 tablet (5 mg total) by mouth every 8 (eight) hours as needed.   pantoprazole 20 MG tablet Commonly known as: Protonix Take 1 tablet (20 mg total) by mouth 2 (two) times daily before a meal.   TYLENOL 500 MG tablet Generic drug: acetaminophen Take 1,000 mg by mouth as needed.        Discharge home in stable condition Infant Feeding: Breast Infant Disposition:home with mother Discharge instruction: per After Visit Summary and Postpartum booklet. Activity: Advance as tolerated. Pelvic rest for 6 weeks.  Diet: routine diet Anticipated Birth Control: BTL done PP Postpartum Appointment:6 weeks Additional Postpartum F/U: Postpartum Depression checkup in 2 weeks (televisit) Future Appointments: Future Appointments  Date Time Provider [place]  10/24/2019  2:00 PM Harlin Heys, MD [facility] None   Follow up Visit:  Follow-up Information    Schedule an appointment as soon as possible for a visit with [name], MD.   Specialties: Obstetrics and Gynecology, Radiology Why: 2 week postpartum mood check (televisit). 6 weeks postpartum visit Contact information: [address] [place]  [address] [phone]                   10/19/2019 [name], MD  [facility]

## 2019-10-19 NOTE — Lactation Note (Signed)
This note was copied from a baby's chart. Lactation Consultation Note  Patient Name: Sandra Rivera [id number] Date: 10/19/2019   Mom had BTL this am. While mom was in surgery, baby was given small amount of DBM per mom's request.  When mom returned from BTL, baby was sleepy and had difficulty getting her to go to the breast.  She finally breast fed for 10 minutes.  Mom has an Elvie pump she used to express 1 ml of colostrum in short period which was given via curved tip syringe while she finger fed.  Mom had really good breast feed before she was discharged, but still needed assistance with latching.  Pointed out feeding cues to mom and encouraged her to put her to the breast whenever she demonstrated hunger cues.  Mom seems exhausted and very anxious.  Mom has a history of anxiety and depression and self discontinued her celexa and Buspar.  Vistaril was given once in hospital.  Mom has a history of migraines which she has taken Fioricet in the past.  Discussed safety with breast feeding.  She was unsuccessful with breast feeding her 24 year old and gave up after 2 to 3 days.  She says she really desires to breast feed this baby.  Hand out given and reviewed normal newborn stomach size, adequate intake and output, supply and demand, normal course of lactation and routine newborn feeding patterns.  Lactation [employer] given and reviewed and encouraged mom to call with any questions, concerns or assistance.  Maternal Data    Feeding    LATCH Score                   Interventions    Lactation Tools Discussed/Used     Consult Status      [name] 10/19/2019, 9:43 PM

## 2019-10-19 NOTE — Transfer of Care (Signed)
Immediate Anesthesia Transfer of Care Note  Patient: Sandra Rivera  Procedure(s) Performed: POST PARTUM TUBAL LIGATION Filshie Clips (Bilateral )  Patient Location: PACU  Anesthesia Type:General  Level of Consciousness: awake, alert  and oriented  Airway & Oxygen Therapy: Patient Spontanous Breathing  Post-op Assessment: Report given to RN and Post -op Vital signs reviewed and stable  Post vital signs: Reviewed and stable  Last Vitals:  Vitals Value Taken Time  BP 123/94 10/19/19 0918  Temp 36.3 C 10/19/19 0917  Pulse 102 10/19/19 0922  Resp 14 10/19/19 0922  SpO2 95 % 10/19/19 0922  Vitals shown include unvalidated device data.  Last Pain:  Vitals:   10/19/19 0917  TempSrc:   PainSc: 10-Worst pain ever         Complications: No complications documented.

## 2019-10-19 NOTE — TOC Progression Note (Signed)
Transition of Care [facility]) - Progression Note    Patient Details  Name: Sandra Rivera MRN: [id number] Date of Birth: December 01, 1995  Transition of [facility]) CM/SW Contact  Larwance Rote, LCSW Phone Number: 10/19/2019, 3:34 PM  Clinical Narrative:   Social work consult received for positive [place].  Patient reported a history of behavioral health issues, depression, and post-partum depression. Brief psycho education on post-partum. Patient was encouraged to follow-up with her primary care provider to discuss treatment methods that are best for her. Patient provided with community resources.  Patient reported that she has a car seat and her home is prepared for her newborn child.    Expected Discharge Plan and Services   Expected Discharge Date: 10/19/19                      Social Determinants of Health (SDOH) Interventions    Readmission Risk Interventions No flowsheet data found.

## 2019-10-19 NOTE — Anesthesia Postprocedure Evaluation (Signed)
Anesthesia Post Note  Patient: SYMONE CORNMAN  Procedure(s) Performed: POST PARTUM TUBAL LIGATION Filshie Clips (Bilateral )  Patient location during evaluation: PACU Anesthesia Type: General Level of consciousness: awake and alert Pain management: pain level controlled Vital Signs Assessment: post-procedure vital signs reviewed and stable Respiratory status: spontaneous breathing, nonlabored ventilation, respiratory function stable and patient connected to nasal cannula oxygen Cardiovascular status: blood pressure returned to baseline and stable Postop Assessment: no apparent nausea or vomiting Anesthetic complications: no   No complications documented.   Last Vitals:  Vitals:   10/19/19 1000 10/19/19 1003  BP:  (!) 143/94  Pulse: 94 78  Resp: 10 13  Temp:    SpO2: 94% 95%    Last Pain:  Vitals:   10/19/19 1003  TempSrc:   PainSc: 3                  Cleda Mccreedy Bayleigh Loflin

## 2019-10-19 NOTE — Progress Notes (Signed)
Reviewed D/C instructions with pt and family. Pt verbalized understanding of teaching. Discharged to home via W/C. Pt to schedule f/u appt.  

## 2019-10-19 NOTE — Anesthesia Procedure Notes (Signed)
Procedure Name: Intubation Date/Time: 10/19/2019 8:23 AM Performed by: Katherine Basset, CRNA Pre-anesthesia Checklist: Patient identified, Emergency Drugs available, Suction available and Patient being monitored Patient Re-evaluated:Patient Re-evaluated prior to induction Oxygen Delivery Method: Circle system utilized Preoxygenation: Pre-oxygenation with 100% oxygen Induction Type: IV induction Ventilation: Mask ventilation without difficulty Laryngoscope Size: Miller and 2 Grade View: Grade I Tube type: Oral Tube size: 7.0 mm Number of attempts: 1 Airway Equipment and Method: Stylet and Oral airway Placement Confirmation: ETT inserted through vocal cords under direct vision,  positive ETCO2 and breath sounds checked- equal and bilateral Secured at: 21 cm Tube secured with: Tape Dental Injury: Teeth and Oropharynx as per pre-operative assessment

## 2019-10-19 NOTE — Discharge Instructions (Signed)

## 2019-10-21 ENCOUNTER — Encounter: Payer: Self-pay | Admitting: Obstetrics and Gynecology

## 2019-10-23 LAB — RHOGAM INJECTION: Unit division: 0

## 2019-10-24 ENCOUNTER — Encounter: Payer: Medicaid Other | Admitting: Obstetrics and Gynecology

## 2019-11-06 ENCOUNTER — Ambulatory Visit (INDEPENDENT_AMBULATORY_CARE_PROVIDER_SITE_OTHER): Payer: Medicaid Other | Admitting: Obstetrics and Gynecology

## 2019-11-06 ENCOUNTER — Encounter: Payer: Self-pay | Admitting: Obstetrics and Gynecology

## 2019-11-06 ENCOUNTER — Other Ambulatory Visit: Payer: Self-pay

## 2019-11-06 DIAGNOSIS — F419 Anxiety disorder, unspecified: Secondary | ICD-10-CM | POA: Diagnosis not present

## 2019-11-06 DIAGNOSIS — F329 Major depressive disorder, single episode, unspecified: Secondary | ICD-10-CM

## 2019-11-06 MED ORDER — DICLOXACILLIN SODIUM 500 MG PO CAPS
500.0000 mg | ORAL_CAPSULE | Freq: Four times a day (QID) | ORAL | 0 refills | Status: AC
Start: 2019-11-06 — End: 2019-11-16

## 2019-11-06 NOTE — Progress Notes (Signed)
Virtual Visit via Telephone Note  I connected with [name] on 11/06/19 at 11:15 AM EDT by telephone and verified that I am speaking with the correct person using two identifiers.   I discussed the limitations, risks, security and privacy concerns of performing an evaluation and management service by telephone and the availability of in person appointments. I also discussed with the patient that there may be a patient responsible charge related to this service. The patient expressed understanding and agreed to proceed.  Patient Location: Home (staying with mother-in-law in [place])  Provider Location: Office   History of Present Illness: Sandra Rivera is a 24 y.o. [phone] female who presents for 2 week postpartum televisit for mood check. Patient has a h/o anxiety and depression, currently not on her medications (discontinued during her pregnancy).  Notes that overall she is feeling ok.  Feels some mood changes but notes that they are still manageable at this time, knows this is normal after pregnancy.    Of note, she complains of nipple soreness and cracking. States that she recently took her newborn to the Pediatrician recently and was diagnosed with thrush. Patient notes she has been treating her baby, but her nipples are still very cracked and sore. Is using a nipple cream but notes it's not helping. She did not receive any antibiotics for treatment.     Lastly, she complains of bleeding. States that she had stopped bleeding a few days ago, however now bleeding has returned. Bleeding is not heavy, no passage of clots or tissue.   Observations/Objective: Height 5\' 7"  (1.702 m), last menstrual period 01/21/2019, currently breastfeeding. Patient does not know most recent weight.   Gen App: no distress  GAD 7 : Generalized Anxiety Score 11/06/2019 04/16/2019 08/15/2018 07/13/2018  Nervous, Anxious, on Edge 2 3 3 3   Control/stop worrying 3 3 3 3   Worry too much - different things 3 3 3  3   Trouble relaxing 3 3 3 3   Restless 3 3 3  0  Easily annoyed or irritable 1 3 3 3   Afraid - awful might happen 2 3 3 3   Total GAD 7 Score 17 21 21 18   Anxiety Difficulty Somewhat difficult Extremely difficult Extremely difficult Not difficult at all     Depression screen [facility] 2/9 11/06/2019 04/16/2019 08/15/2018 07/13/2018  Decreased Interest 1 1 2  0  Down, Depressed, Hopeless 2 3 2 2   PHQ - 2 Score 3 4 4 2   Altered sleeping 0 1 3 2   Tired, decreased energy 3 1 2 3   Change in appetite 0 1 3 2   Feeling bad or failure about yourself  2 3 3 2   Trouble concentrating 0 3 3 2   Moving slowly or fidgety/restless 0 3 1 1   Suicidal thoughts - 0 0 0  PHQ-9 Score 8 16 19 14   Difficult doing work/chores Somewhat difficult Extremely dIfficult Extremely dIfficult Somewhat difficult    Edinburgh Postnatal Depression Scale Screening Tool 10/19/2019  I have been able to laugh and see the funny side of things. 0  I have looked forward with enjoyment to things. 1  I have blamed myself unnecessarily when things went wrong. 3  I have been anxious or worried for no good reason. 2  I have felt scared or panicky for no good reason. 2  Things have been getting on top of me. 3  I have been so unhappy that I have had difficulty sleeping. 2  I have felt sad or miserable.  1  I have been so unhappy that I have been crying. 2  The thought of harming myself has occurred to me. 0  Edinburgh Postnatal Depression Scale Total 16       Assessment and Plan: 1. Postpartum state - overall doing well per patient. Discussed that current bleeding pattern was normal during first few weeks postpartum. Advised to inform if bleeding worsen or becomes painful.  2. Anxiety and depression - patient with elevated scores, but notes that she feels she is managing ok. Does not desires to resume medications at this time.  Declines counseling. States that she has good support at home. Will continue to f /u at next postpartum visit.   Newborn thrush - Patient advised that she needs to receive treatment as well if newborn was diagnosed with thrush. Will prescribe Dicloxacillin. Patient request prescription to be sent to local [facility] in [facility].     Follow Up Instructions: Follow up in 4 weeks for final postpartum visit.    I discussed the assessment and treatment plan with the patient. The patient was provided an opportunity to ask questions and all were answered. The patient agreed with the plan and demonstrated an understanding of the instructions.   The patient was advised to call back or seek an in-person evaluation if the symptoms worsen or if the condition fails to improve as anticipated.  I provided 7 minutes of non-face-to-face time during this encounter.   Hildred Laser, MD  [facility]

## 2019-11-06 NOTE — Progress Notes (Signed)
Televisit-Pt having televisit to check depression and anxiety. Pt called and transferred from front desk to nurse. Updated all information. PHQ-9=8. GAD-7= 17. Pt stated the baby has thrush and being treated but her breast(numbs) are cracked and hurting.

## 2019-11-06 NOTE — Patient Instructions (Signed)
Thrush and Breastfeeding Thrush, also called candidiasis, is a fungal infection that can be passed between a mother and her baby during breastfeeding. It can cause nipple pain and sensitivity, and can cause symptoms in a baby, such as a rash or white patches in the mouth. If you are breastfeeding, you and your baby may need treatment at the same time in order to clear up the infection, even if one does not have symptoms. Occasionally, other family members, especially your sexual partner, may need to be treated at the same time. What are the causes? This condition is caused by a sudden increase (overgrowth) of the Candida fungus. This fungus is normally present in small amounts in warm, dark, and moist places of the body, such as skin folds under the breast and wet nipples covered by bras or nursing bra pads. Normally, the fungus is kept at healthy levels by the natural bacteria in our bodies. When the body's natural balance of bacteria is altered, the fungus can grow and multiply quickly. What increases the risk? You are more likely to develop this condition if:  You or your baby has been taking antibiotic medicines.  Your nipples are cracked.  You are taking birth control pills (oral contraceptives).  You are taking medicines to reduce inflammation (steroids), such as asthma medicines.  You have had a previous yeast infection. What are the signs or symptoms? Symptoms of this condition include:  Breast pain during, between, or right after feedings.  Nipples that are: ? Sore. Soreness may start suddenly two weeks after giving birth. ? Sensitive. They may be painful even with a light touch. ? A deep pink or red color. They may have small blisters on them. ? Puffy and shiny. ? Leaky. ? Itchy. ? Cracked, scaly, or flaky. Your baby may have the following symptoms:  Bright red rash on the buttocks.  Sore-looking blisters or pimples (pustules) on the buttocks.  White patches on the  tongue. The patches cannot be wiped off with a clean paper towel.  Fussiness.  Refusal to breastfeed. How is this diagnosed? This condition is diagnosed based on:  Your symptoms.  Culture tests. This is when samples of discharge from your breasts are grown and then checked under a microscope. How is this treated? This condition may be treated by:  Applying antifungal cream to your nipples after each feeding.  Medicine for you or your baby. Symptoms usually improve within 24-48 hours after starting treatment. In some cases, symptoms may get worse before they get better. Make sure that you, your baby, and your sexual partner get checked for thrush and treated at the same time. Follow these instructions at home: Medicines  Take or use over-the-counter and prescription medicines, creams, and ointments only as told by your health care provider.  Give your child over-the-counter and prescription medicines only as told by his or her health care provider.  If you or your child were prescribed an antifungal medicine, apply it or give it as told by your health care provider. Do not stop using the medicine even if you or your child starts to feel better. Stopping the medicine early can cause symptoms to return.  If directed, take a probiotic supplement. Probiotics are the good bacteria and yeasts that live in your body and keep you and your digestive system healthy. General hygiene   Wash your hands often with hot, soapy water, and pat them dry. Wash them before and after nursing, after changing diapers, and after using the   bathroom.  Wash your baby's hands often, especially if he or she sucks on his or her fingers.  Before breastfeeding, wash your nipples with warm water. Let nipples air dry after washing and feeding.  If your baby uses a pacifier, rubber nipples, teethers, or mouth toys, boil them for 20 minutes a day and replace them every week.  Wash your breast pump and all its  parts thoroughly in a solution of water and bleach. Boil all parts that touch milk (except the rubber gaskets).  Wear 100% cotton bras and wash them every day in hot water. Consider using bleach to kill fungus. Change bra pads after each feeding.  Use very hot water to wash any towels or clothing that has contact with infected areas. General instructions  Make sure that your baby is seen by a health care provider, and that you and your baby get treated at the same time.  Try nursing more often but for shorter periods of time. Start nursing on the least sore side.  If nursing becomes too painful, try temporarily pumping your milk instead. Do not save or freeze this milk, because giving it to your baby after treatment is done could cause the infection to return.  Eat yogurt that has active, live cultures. Contact a health care provider if:  You or your baby get worse or do not get better after 24-48 hours of treatment.  You take antibiotics and then your breasts develop shooting pains, discomfort, itching, or burning. Get help right away if:  You have a fever or other symptoms that do not improve or get worse.  You develop swelling and severe pain in your breast.  You develop blisters on your breast.  You feel a lump in your breast, with or without pain.  Your nipple starts bleeding. Summary  Thrush is a fungal infection that can be passed between a mother and her baby during breastfeeding.  This condition may be treated with topical antifungal creams applied to the nipple after each feeding.  The spread of the infection can be controlled by washing hands, keeping your nipples clean and dry, and washing and sterilizing breast pumps, pacifiers, and other items that touch infected areas. This information is not intended to replace advice given to you by your health care provider. Make sure you discuss any questions you have with your health care provider. Document Revised:  06/20/2018 Document Reviewed: 06/07/2016 Elsevier Patient Education  2020 Elsevier Inc.  

## 2019-11-26 NOTE — Progress Notes (Deleted)
    PT is present today for her postpartum visit. Pt stated that she is breastfeeding and have not had sexually intercourse recently. Pt stated that she would like to get the  for birth control. EPDS=   Pt stated that she is doing well no complaints.

## 2019-11-27 ENCOUNTER — Encounter: Payer: Medicaid Other | Admitting: Obstetrics and Gynecology

## 2019-12-04 ENCOUNTER — Encounter: Payer: Medicaid Other | Admitting: Obstetrics and Gynecology

## 2019-12-05 ENCOUNTER — Encounter: Payer: Self-pay | Admitting: Obstetrics and Gynecology

## 2019-12-05 ENCOUNTER — Other Ambulatory Visit: Payer: Self-pay

## 2019-12-05 ENCOUNTER — Ambulatory Visit (INDEPENDENT_AMBULATORY_CARE_PROVIDER_SITE_OTHER): Payer: Medicaid Other | Admitting: Obstetrics and Gynecology

## 2019-12-05 DIAGNOSIS — F419 Anxiety disorder, unspecified: Secondary | ICD-10-CM

## 2019-12-05 DIAGNOSIS — F329 Major depressive disorder, single episode, unspecified: Secondary | ICD-10-CM

## 2019-12-05 NOTE — Progress Notes (Signed)
Pt present for postpartum care. Pt stated that she was doing well no problems. EPDS=15. GAD-7=16.

## 2019-12-05 NOTE — Progress Notes (Signed)
   OBSTETRICS POSTPARTUM CLINIC PROGRESS NOTE  Subjective:     Sandra Rivera is a 24 y.o. [id number] female who presents for a postpartum visit. She is 6 weeks postpartum following a spontaneous vaginal delivery. I have fully reviewed the prenatal and intrapartum course. The delivery was at 39 gestational weeks.  Anesthesia: none. Postpartum course has been well. Baby's course has been well except thrush 2 weeks postpartum. Baby is feeding by both breast and bottle - Similac Advance. Bleeding: patient has not resumed menses. Bowel function is normal. Bladder function is normal. Patient is sexually active. Contraception method desired is postpartum tubal. Postpartum depression screening: positive (EDPS = 15, GAD = 16).  The following portions of the patient's history were reviewed and updated as appropriate: allergies, current medications, past family history, past medical history, past social history, past surgical history and problem list.  Review of Systems Pertinent items noted in HPI and remainder of comprehensive ROS otherwise negative.   Objective:    BP 116/77   Pulse (!) 105   Ht 5\' 7"  (1.702 m)   Wt 190 lb 6.4 oz (86.4 kg)   LMP 01/21/2019 (Exact Date) Comment: Patient is on birth control patch and pill  Breastfeeding Yes   BMI 29.82 kg/m   General:  alert and no distress   Breasts:  inspection negative, no nipple discharge or bleeding, no masses or nodularity palpable  Lungs: clear to auscultation bilaterally  Heart:  regular rate and rhythm, S1, S2 normal, no murmur, click, rub or gallop  Abdomen: soft, non-tender; bowel sounds normal; no masses,  no organomegaly.  Well healed infraumbilical incision   Vulva:  normal  Vagina: normal vagina, no discharge, exudate, lesion, or erythema  Cervix:  no cervical motion tenderness and no lesions  Corpus: normal size, contour, position, consistency, mobility, non-tender  Adnexa:  normal adnexa and no mass, fullness, tenderness  Rectal  Exam: Not performed.         Labs:  Lab Results  Component Value Date   HGB 10.8 (L) 10/19/2019    Assessment:   1. Postpartum care following vaginal delivery 2. Anxiety and depression  3. H/o tubal ligation   Plan:   1. Contraception: tubal ligation performed postpartum.  2. Anxiety and depression, patient today with elevated scores on Edinburgh and GAD-7 screens. Still declines interventions at this time (counseling, resuming medications that she discontinued with pregnancy). Advised patient to continue to monitor symptoms closely, and to seek care or resume medications as warranted.  3. Follow up as needed. Will f/u with PCP for routine care, can see GYN as needed.   [birth year]-20-1977, MD [facility]

## 2019-12-31 ENCOUNTER — Telehealth: Payer: Self-pay

## 2019-12-31 NOTE — Telephone Encounter (Signed)
She was on Celex for depression (and Buspar for anxiety) during the early parts of pregnancy. She can resume the meds depending what her symptoms are.  We will need to look back in her med history for the correct dosing.

## 2019-12-31 NOTE — Telephone Encounter (Signed)
Patient called in stating that she was told to call in if her post partum depression was beginning to overwhelm her, and was told that her provider could send in some medication to help relieve some of her symptoms. Could you please advise?

## 2019-12-31 NOTE — Telephone Encounter (Signed)
Hello Dr. Valentino Saxon, Please advise. Thanks [employer]

## 2020-01-03 ENCOUNTER — Telehealth: Payer: Self-pay

## 2020-01-03 MED ORDER — BUSPIRONE HCL 5 MG PO TABS: 5.0000 mg | ORAL_TABLET | Freq: Two times a day (BID) | ORAL | 1 refills | Status: DC

## 2020-01-03 MED ORDER — CITALOPRAM HYDROBROMIDE 20 MG PO TABS
20.0000 mg | ORAL_TABLET | Freq: Every day | ORAL | 0 refills | Status: DC
Start: 2020-01-03 — End: 2020-02-03

## 2020-01-03 NOTE — Addendum Note (Signed)
Addended by: Dorian Pod on: 01/03/2020 01:49 PM   Modules accepted: Orders

## 2020-01-03 NOTE — Telephone Encounter (Signed)
I have sent in Buspar 5 mg BID to the pharmacy. I have notified patient.

## 2020-01-03 NOTE — Addendum Note (Signed)
Addended by: Dorian Pod on: 01/03/2020 01:10 PM   Modules accepted: Orders

## 2020-01-03 NOTE — Telephone Encounter (Signed)
After looking back through the chart patient has been on Celexa 10 mg for depression and Buspar 5 mg BID for anxiety. Per Dr. Logan Bores I have sent in 30 day supply of Celexa to the pharmacy for patient. I did a PHQ 9 that the score was a 18 and GAD7 that was a score of 19. Please advise if you are wanting to send in Buspar as well.

## 2020-01-03 NOTE — Telephone Encounter (Signed)
Pt called in and stated that she hasn't heard back from anyone. I told the pt that her provider is out of the office until next week. The pt said that she is going out of town and would like it sent in before she leaves. The pt stated that she is still breast feeding. I told her I will send a message to Dr. Logan Bores nurse and that someone will be in touch with her. Please advise. Pt also said that she will be home till Tommorrow and that we can send it to [facility]

## 2020-01-03 NOTE — Telephone Encounter (Signed)
Yes, she should probably resume the Buspar as well with that score.

## 2020-01-03 NOTE — Telephone Encounter (Signed)
Please see previous message

## 2020-02-01 ENCOUNTER — Other Ambulatory Visit: Payer: Self-pay | Admitting: Obstetrics and Gynecology

## 2020-02-03 NOTE — Telephone Encounter (Signed)
Please advise on refill. He had filled when you was out.

## 2020-03-26 ENCOUNTER — Other Ambulatory Visit: Payer: Self-pay | Admitting: Obstetrics and Gynecology

## 2020-07-09 ENCOUNTER — Other Ambulatory Visit: Payer: Self-pay | Admitting: Obstetrics and Gynecology

## 2020-07-10 NOTE — Telephone Encounter (Signed)
I have refilled her meds until October. Patient needs appt for follow up in October before any further refill requests are honored.

## 2020-07-13 NOTE — Telephone Encounter (Signed)
Good morning Sandra Rivera, Will you please contact the pt to schedule an appt for mood check sometime in September before her medication runs out. Thanks [employer]

## 2020-07-21 ENCOUNTER — Encounter: Payer: Medicaid Other | Admitting: Obstetrics and Gynecology

## 2020-08-11 ENCOUNTER — Encounter: Payer: Medicaid Other | Admitting: Obstetrics and Gynecology

## 2020-08-13 ENCOUNTER — Encounter: Payer: Self-pay | Admitting: Obstetrics and Gynecology

## 2020-11-17 ENCOUNTER — Encounter: Payer: Medicaid Other | Admitting: Obstetrics and Gynecology

## 2020-11-23 ENCOUNTER — Encounter: Payer: Self-pay | Admitting: Obstetrics and Gynecology

## 2020-11-25 ENCOUNTER — Other Ambulatory Visit: Payer: Self-pay | Admitting: Obstetrics and Gynecology

## 2020-11-28 ENCOUNTER — Other Ambulatory Visit: Payer: Self-pay | Admitting: Obstetrics and Gynecology

## 2020-12-01 NOTE — Telephone Encounter (Signed)
Please see if patient is willing to do a televist to get refill of her medication, as she no-showed for her last appointment.

## 2020-12-02 ENCOUNTER — Telehealth: Payer: Self-pay | Admitting: Obstetrics and Gynecology

## 2020-12-02 NOTE — Telephone Encounter (Signed)
Informed pt that [facility] would refill her medication for a 30 day supply and encouraged pt to keep her appointment on 12/17/2020 for future refills.

## 2020-12-02 NOTE — Telephone Encounter (Signed)
Pt called asking about a refill request- I made her aware that she has not been seen since September of 2021, that for medications she needs to be seen yearly to monitor health. Pt is scheduled for 10-06 and is requesting refill on meds. Please Advise.

## 2020-12-17 ENCOUNTER — Encounter: Payer: Self-pay | Admitting: Obstetrics and Gynecology

## 2020-12-17 ENCOUNTER — Other Ambulatory Visit: Payer: Self-pay

## 2020-12-17 ENCOUNTER — Ambulatory Visit: Payer: Medicaid Other | Admitting: Obstetrics and Gynecology

## 2020-12-17 VITALS — BP 123/81 | HR 85 | Ht 67.0 in | Wt 165.8 lb

## 2020-12-17 DIAGNOSIS — F419 Anxiety disorder, unspecified: Secondary | ICD-10-CM

## 2020-12-17 DIAGNOSIS — F32A Depression, unspecified: Secondary | ICD-10-CM | POA: Diagnosis not present

## 2020-12-17 DIAGNOSIS — N943 Premenstrual tension syndrome: Secondary | ICD-10-CM

## 2020-12-17 DIAGNOSIS — N808 Other endometriosis: Secondary | ICD-10-CM | POA: Diagnosis not present

## 2020-12-17 MED ORDER — TRANEXAMIC ACID 650 MG PO TABS: 1300.0000 mg | ORAL_TABLET | Freq: Three times a day (TID) | ORAL | 2 refills | Status: DC

## 2020-12-17 MED ORDER — VENLAFAXINE HCL ER 75 MG PO CP24: 75.0000 mg | ORAL_CAPSULE | Freq: Every day | ORAL | 1 refills | Status: DC
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
## 2020-12-21 ENCOUNTER — Encounter: Payer: Self-pay | Admitting: Obstetrics and Gynecology

## 2020-12-22 ENCOUNTER — Other Ambulatory Visit: Payer: Self-pay | Admitting: Obstetrics and Gynecology

## 2020-12-23 ENCOUNTER — Other Ambulatory Visit: Payer: Self-pay | Admitting: Obstetrics and Gynecology

## 2020-12-28 ENCOUNTER — Encounter: Payer: Self-pay | Admitting: Obstetrics and Gynecology

## 2021-01-15 ENCOUNTER — Ambulatory Visit: Payer: Medicaid Other | Admitting: Obstetrics and Gynecology

## 2021-01-22 ENCOUNTER — Telehealth: Payer: Self-pay | Admitting: Obstetrics and Gynecology

## 2021-01-22 NOTE — Telephone Encounter (Signed)
Medication: Lysteda

## 2021-01-22 NOTE — Telephone Encounter (Signed)
Spoke to pt and informed her of the information given by the pharmacy. Pt stated that no one at the pharmacy informed her that she would have to come back for the remainder of the medication. Pt was informed that the medication was ready for pick up.

## 2021-01-22 NOTE — Telephone Encounter (Signed)
Spoke to [facility] concerning the pt's call to the office. Was informed that pt was given a partial dose due to they didn't have 30 tablets at the time and informed the pt's mother that she would have to come back to pick up the remainder.Marland Kitchen

## 2021-01-22 NOTE — Telephone Encounter (Signed)
Pt called stating that she was not given the right amount of pills with her last RX- RX states 30 pills she received 18 total from [facility]. Pt is concerned because she is bleeding again. She asked to rescheduled her last appointment that was cancelled because Valentino Saxon was out of office. I have rescheduled her to next opening which is 12-06, she is wanting to discuss sx options, bleeding, and mood check. Pt is concerned with how fatigue she is and believes she is having complications with medications. Please Advise.

## 2021-02-16 ENCOUNTER — Telehealth (INDEPENDENT_AMBULATORY_CARE_PROVIDER_SITE_OTHER): Payer: Medicaid Other | Admitting: Obstetrics and Gynecology

## 2021-02-16 ENCOUNTER — Encounter: Payer: Self-pay | Admitting: Obstetrics and Gynecology

## 2021-02-16 VITALS — Ht 67.0 in | Wt 164.0 lb

## 2021-02-16 DIAGNOSIS — F419 Anxiety disorder, unspecified: Secondary | ICD-10-CM | POA: Diagnosis not present

## 2021-02-16 DIAGNOSIS — F32A Depression, unspecified: Secondary | ICD-10-CM

## 2021-02-16 DIAGNOSIS — N920 Excessive and frequent menstruation with regular cycle: Secondary | ICD-10-CM | POA: Diagnosis not present

## 2021-02-16 MED ORDER — VENLAFAXINE HCL ER 150 MG PO CP24: 150.0000 mg | ORAL_CAPSULE | Freq: Every day | ORAL | 3 refills | Status: DC

## 2021-02-16 MED ORDER — BUSPIRONE HCL 10 MG PO TABS: 10.0000 mg | ORAL_TABLET | Freq: Three times a day (TID) | ORAL | 6 refills | Status: DC

## 2021-02-16 NOTE — Patient Instructions (Signed)
Dilation and Curettage or Vacuum Curettage Dilation and curettage (D&C) and vacuum curettage are minor procedures. A D&C involves stretching the cervix (dilation) and scraping the inside lining of the uterus, or endometrium, with surgical instruments (curettage). During a D&C, tissue is gently scraped starting from the top of the uterus down to the lowest part of the uterus. During a vacuum curettage, the lining and tissue in the uterus are removed using gentle suction. Curettage may be performed to either diagnose or treat a problem. A diagnostic curettage may be done if you have: Irregular bleeding or clotting from the uterus. Spotting between menstrual periods, prolonged menstrual periods, or other abnormal bleeding. Bleeding after menopause. No menstrual period (amenorrhea). A change in the size and shape of the uterus. Abnormal endometrial cells discovered during a Pap test. For treatment, curettage may be done: To remove an IUD (intrauterine device). To remove the remaining placenta after giving birth. During an abortion or after a miscarriage. To remove growths in the lining of the uterus. To remove certain rare types of noncancerous lumps (fibroids). Tell a health care provider about: Any allergies you have, including allergies to prescribed medicine or latex. All medicines you are taking, including vitamins, herbs, eye drops, creams, and over-the-counter medicines. Any problems you or family members have had with anesthetic medicines. Any blood disorders you have. Any surgeries you have had. Your medical history and any medical conditions you have. Whether you are pregnant or may be pregnant. Recent vaginal infections you have had. Recent menstrual periods, bleeding problems you have had, and what form of birth control (contraception) you use. What are the risks? Generally, this is a safe procedure. However, problems may occur, including: Infection. Heavy vaginal  bleeding. Allergic reactions to medicines. Damage to the cervix or nearby structures or organs. Scar tissue developing inside the uterus. This can cause abnormal periods and may make it harder to get pregnant. A hole (perforation) in the wall of the uterus. This is rare. What happens before the procedure? Staying hydrated Follow instructions from your health care provider about hydration, which may include: Up to 2 hours before the procedure - you may continue to drink clear liquids, such as water, clear fruit juice, black coffee, and plain tea.  Eating and drinking restrictions Follow instructions from your health care provider about eating and drinking, which may include: 8 hours before the procedure - stop eating heavy meals or foods, such as meat, fried foods, or fatty foods. 6 hours before the procedure - stop eating light meals or foods, such as toast or cereal. 6 hours before the procedure - stop drinking milk or drinks that contain milk. 2 hours before the procedure - stop drinking clear liquids. If your health care provider told you to take your medicine on the day of your procedure, take them with only a sip of water. Medicines Ask your health care provider about: Changing or stopping your regular medicines. This is especially important if you are taking diabetes medicines or blood thinners. Taking medicines such as aspirin and ibuprofen. These medicines can thin your blood. Do not take these medicines unless your health care provider tells you to take them. Taking over-the-counter medicines, vitamins, herbs, and supplements. You may be given a medicine to soften the cervix. This will help with dilation. Tests You may be given a pregnancy test on the day of the procedure. You may have a blood or urine sample taken. General instructions Do not use any products that contain nicotine or tobacco   for at least 4 weeks before the procedure. These products include cigarettes, chewing  tobacco, and vaping devices, such as e-cigarettes. If you need help quitting, ask your health care provider. For 24 hours before your procedure: Do not douche, use tampons, or have sex. Do not use medicines, creams, or suppositories in the vagina. Ask your health care provider what steps will be taken to help prevent infection. These may include: Removing hair at the procedure site. Washing skin with a germ-killing soap. Taking antibiotic medicine. Plan to have a responsible adult take you home from the hospital or clinic. If you will be going home right after the procedure, plan to have a responsible adult care for you for the time you are told. This is important. What happens during the procedure?  An IV will be inserted into one of your veins. You will be given one of the following: A medicine that numbs the area in and around the cervix (local anesthetic). A medicine to make you fall asleep (general anesthetic). You will lie down on your back, with your feet in foot rests (stirrups). The size and position of your uterus will be checked. A lubricated instrument (speculum or Sims retractor) will be inserted into your vagina to widen its walls. This will allow your health care provider to see your cervix. Your cervix will be softened and dilated. This may be done by: Taking medicine by mouth or vaginally. Having thin rods or gradual widening instruments inserted into your cervix. A small, sharp, curved instrument (curette) will be used to scrape a small amount of tissue or cells from the endometrium or cervical canal. In some cases, gentle suction is applied with the curette. The cells will be taken to a lab for testing. The procedure may vary among health care providers and hospitals. What happens after the procedure? Your blood pressure, heart rate, breathing rate, and blood oxygen level will be monitored until you leave the hospital or clinic. You may have mild cramping, a backache,  pain, and light bleeding or spotting. You may pass small blood clots from your vagina. You may have to wear compression stockings. These stockings help to prevent blood clots and reduce swelling in your legs. It is up to you to get the results of your procedure. Ask your health care provider, or the department that is doing the procedure, when your results will be ready. Summary Dilation and curettage (D&C) involves stretching (dilating) the cervix and scraping the inside lining of the uterus with surgical instruments (curettage). Follow your health care provider's instructions about when to stop eating and drinking before the procedure, and whether to stop or change any medicines. After the procedure, you may have mild cramping, a backache, pain, and light bleeding or spotting. You may pass small blood clots from your vagina. Plan to have a responsible adult take you home from the hospital or clinic. This information is not intended to replace advice given to you by your health care provider. Make sure you discuss any questions you have with your health care provider. Document Revised: 02/19/2020 Document Reviewed: 02/19/2020 Elsevier Patient Education  2022 [employer]  

## 2021-02-16 NOTE — Progress Notes (Signed)
Virtual Visit via Video Note  I connected with Sandra Rivera on 02/16/21 at  9:30 AM EST by a video enabled telemedicine application and verified that I am speaking with the correct person using two identifiers.  Location: Patient: Home Provider: Office   I discussed the limitations of evaluation and management by telemedicine and the availability of in person appointments. The patient expressed understanding and agreed to proceed.  History of Present Illness: Sandra Rivera is a 25 y.o. G10P2002 female who presents for 2 month follow up of mood symptoms.  She was initiated on Effexor 75 mg for management of depression and anxiety.  States that she has not noticed much difference.  Has several life stressors going on (mostly dealing with her living situation, financial constraints, and relationship problems with FOB being intermittently present for support).    [name] also complains of continued issues with heavy vaginal bleeding during her cycles.  Last visit was given a prescription for Lysteda, reports that the medication made her feel tired and overall not well. Also still noting moderate dysmenorrhea, and some pain outside of her cycle.    Observations/Objective: Height 5\' 7"  (1.702 m), weight 164 lb (74.4 kg), last menstrual period 01/25/2021, not currently breastfeeding. Gen App: NAD Psych: patient appears somewhat distracted, but normal affect, speech.   GAD 7 : Generalized Anxiety Score 02/16/2021 12/17/2020 12/05/2019 11/06/2019  Nervous, Anxious, on Edge 3 3 3 2   Control/stop worrying 3 3 3 3   Worry too much - different things 3 3 3 3   Trouble relaxing 3 3 2 3   Restless 0 2 1 3   Easily annoyed or irritable 3 2 2 1   Afraid - awful might happen 3 3 2 2   Total GAD 7 Score 18 19 16 17   Anxiety Difficulty Somewhat difficult Extremely difficult Very difficult Somewhat difficult    Depression screen [facility] 2/9 02/16/2021 12/17/2020 11/06/2019 04/16/2019 08/15/2018  Decreased Interest 3 2 1  1 2   Down, Depressed, Hopeless 3 3 2 3 2   PHQ - 2 Score 6 5 3 4 4   Altered sleeping 3 3 0 1 3  Tired, decreased energy 3 3 3 1 2   Change in appetite 3 3 0 1 3  Feeling bad or failure about yourself  3 3 2 3 3   Trouble concentrating 3 3 0 3 3  Moving slowly or fidgety/restless 3 2 0 3 1  Suicidal thoughts 1 1 - 0 0  PHQ-9 Score 25 23 8 16 19   Difficult doing work/chores Extremely dIfficult Extremely dIfficult Somewhat difficult Extremely dIfficult Extremely dIfficult     Assessment and Plan: 1. Anxiety and depression - Advised on increasing Effexor dosing to 150 mg and Buspar to 10 mg.  - Ambulatory referral to Psychiatry for further management and possible counseling  2. Menorrhagia with regular cycle and pelvic pain - Patient has tried multiple hormonal management options with no relief in symptoms. Concerns for possible endometriosis however laparoscopy biopsy was negative.  Discussed option for management with D&C for bleeding, or could consider hysterectomy as she has had a BTL.  Patient ok to consider D&C.  The risks of surgery were discussed in detail with the patient.  Patient was told that the likelihood that her condition and symptoms will be treated effectively with this surgical management was very high; the postoperative expectations were also discussed in detail. The patient also understands the alternative treatment options which were discussed in full. All questions were answered.  She was  told that she will be contacted by our surgical scheduler regarding the time and date of her surgery; routine preoperative instructions will be given to her by the preoperative nursing team.  Patient education handouts about the procedure were placed in AVS for the patient to review at home.    Follow Up Instructions: To schedule for surgery.    I discussed the assessment and treatment plan with the patient. The patient was provided an opportunity to ask questions and all were answered.  The patient agreed with the plan and demonstrated an understanding of the instructions.   The patient was advised to call back or seek an in-person evaluation if the symptoms worsen or if the condition fails to improve as anticipated.  I provided 38 minutes of non-face-to-face time during this encounter.   Hildred Laser, MD [facility]

## 2021-03-04 ENCOUNTER — Ambulatory Visit: Payer: Medicaid Other | Attending: Physician Assistant | Admitting: Speech Pathology

## 2021-03-04 ENCOUNTER — Other Ambulatory Visit: Payer: Self-pay

## 2021-03-04 DIAGNOSIS — R41841 Cognitive communication deficit: Secondary | ICD-10-CM | POA: Insufficient documentation
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
## 2021-03-04 NOTE — Patient Instructions (Signed)
Follow up with counselor

## 2021-03-18 ENCOUNTER — Encounter: Payer: Medicaid Other | Admitting: Speech Pathology

## 2021-03-25 ENCOUNTER — Other Ambulatory Visit: Payer: Self-pay | Admitting: Obstetrics and Gynecology

## 2021-03-25 DIAGNOSIS — Z01818 Encounter for other preprocedural examination: Secondary | ICD-10-CM

## 2021-03-26 ENCOUNTER — Encounter
Admission: RE | Admit: 2021-03-26 | Discharge: 2021-03-26 | Disposition: A | Discharge status: Home / Self Care | Payer: Medicaid Other | Source: Ambulatory Visit | Attending: Obstetrics and Gynecology | Admitting: Obstetrics and Gynecology

## 2021-03-26 ENCOUNTER — Other Ambulatory Visit: Payer: Self-pay

## 2021-03-26 VITALS — Ht 67.0 in | Wt 160.0 lb

## 2021-03-26 DIAGNOSIS — E876 Hypokalemia: Secondary | ICD-10-CM

## 2021-03-26 DIAGNOSIS — Z8639 Personal history of other endocrine, nutritional and metabolic disease: Secondary | ICD-10-CM

## 2021-03-26 HISTORY — DX: Thalassemia minor: D56.3

## 2021-03-26 NOTE — Patient Instructions (Signed)
Your procedure is scheduled on: 04/05/21 Report to DAY SURGERY DEPARTMENT LOCATED ON 2ND FLOOR MEDICAL MALL ENTRANCE. To find out your arrival time please call [phone] between 1PM - 3PM on 04/02/21.  Remember: Instructions that are not followed completely may result in serious medical risk, up to and including death, or upon the discretion of your surgeon and anesthesiologist your surgery may need to be rescheduled.     _X__ 1. Do not eat food or drink liquids after midnight the night before your procedure.                 No gum chewing or hard candies.   __X__2.  On the morning of surgery brush your teeth with toothpaste and water, you                 may rinse your mouth with mouthwash if you wish.  Do not swallow any              toothpaste of mouthwash.     _X__ 3.  No Alcohol for 24 hours before or after surgery.   _X__ 4.  Do Not Smoke or use e-cigarettes For 24 Hours Prior to Your Surgery.                 Do not use any chewable tobacco products for at least 6 hours prior to                 surgery.  ____  5.  Bring all medications with you on the day of surgery if instructed.   __X__  6.  Notify your doctor if there is any change in your medical condition      (cold, fever, infections).     Do not wear jewelry, make-up, hairpins, clips or nail polish. Do not wear lotions, powders, or perfumes.  Do not shave body hair 48 hours prior to surgery. Men may shave face and neck. Do not bring valuables to the hospital.    [facility] is not responsible for any belongings or valuables.  Contacts, dentures/partials or body piercings may not be worn into surgery. Bring a case for your contacts, glasses or hearing aids, a denture cup will be supplied. Leave your suitcase in the car. After surgery it may be brought to your room. For patients admitted to the hospital, discharge time is determined by your treatment team.   Patients discharged the day of surgery will not be allowed to  drive home.   Please read over the following fact sheets that you were given:     __X__ Take these medicines the morning of surgery with A SIP OF WATER:    1. busPIRone (BUSPAR) 10 MG tablet  2. venlafaxine XR (EFFEXOR XR) 150 MG 24 hr capsule  3.   4.  5.  6.  ____ Fleet Enema (as directed)   ____ Use CHG Soap/SAGE wipes as directed  ____ Use inhalers on the day of surgery  ____ Stop metformin/Janumet/Farxiga 2 days prior to surgery    ____ Take 1/2 of usual insulin dose the night before surgery. No insulin the morning          of surgery.   ____ Stop Blood Thinners Coumadin/Plavix/Xarelto/Pleta/Pradaxa/Eliquis/Effient/Aspirin  on   Or contact your Surgeon, Cardiologist or Medical Doctor regarding  ability to stop your blood thinners  __X__ Stop Anti-inflammatories 7 days before surgery such as Advil, Ibuprofen, Motrin,  BC or Goodies Powder, Naprosyn, Naproxen, Aleve, Aspirin  Tylenol is OK to continue  __X__ Stop all herbals and supplements, fish oil or vitamins for 1 week  until after surgery.    ____ Bring C-Pap to the hospital.

## 2021-03-30 ENCOUNTER — Other Ambulatory Visit: Payer: Self-pay | Admitting: Obstetrics and Gynecology

## 2021-03-30 ENCOUNTER — Telehealth: Payer: Self-pay | Admitting: Obstetrics and Gynecology

## 2021-03-30 NOTE — Telephone Encounter (Signed)
Pt called asking to be tested for "everything" I asked pt for me detail to that- she said father of her baby might have Hep C and she is wanted to be tested for "everything" pt is scheduled for Pre Admit Labs tom- they told her to call our office. Please Advise.

## 2021-03-30 NOTE — Telephone Encounter (Signed)
I can put in orders for STD serology and a urine GC/CT for her to do at her preoperative testing.   Dr. Valentino Saxon

## 2021-03-31 ENCOUNTER — Encounter: Payer: Self-pay | Admitting: Urgent Care

## 2021-03-31 ENCOUNTER — Other Ambulatory Visit
Admission: RE | Admit: 2021-03-31 | Discharge: 2021-03-31 | Disposition: A | Discharge status: Home / Self Care | Payer: Medicaid Other | Source: Ambulatory Visit | Attending: Obstetrics and Gynecology | Admitting: Obstetrics and Gynecology

## 2021-03-31 ENCOUNTER — Other Ambulatory Visit: Payer: Self-pay

## 2021-03-31 DIAGNOSIS — Z01812 Encounter for preprocedural laboratory examination: Secondary | ICD-10-CM | POA: Insufficient documentation

## 2021-03-31 DIAGNOSIS — Z01818 Encounter for other preprocedural examination: Secondary | ICD-10-CM

## 2021-03-31 DIAGNOSIS — Z8639 Personal history of other endocrine, nutritional and metabolic disease: Secondary | ICD-10-CM | POA: Diagnosis not present

## 2021-03-31 LAB — CBC
HCT: 43.7 % (ref 36.0–46.0)
Hemoglobin: 14.7 g/dL (ref 12.0–15.0)
MCH: 28.6 pg (ref 26.0–34.0)
MCHC: 33.6 g/dL (ref 30.0–36.0)
MCV: 85 fL (ref 80.0–100.0)
Platelets: 262 10*3/uL (ref 150–400)
RBC: 5.14 MIL/uL — ABNORMAL HIGH (ref 3.87–5.11)
RDW: 13.7 % (ref 11.5–15.5)
WBC: 5.4 10*3/uL (ref 4.0–10.5)
nRBC: 0 % (ref 0.0–0.2)

## 2021-03-31 LAB — CHLAMYDIA/NGC RT PCR (ARMC ONLY)
Chlamydia Tr: NOT DETECTED
N gonorrhoeae: NOT DETECTED

## 2021-03-31 LAB — HEPATITIS C ANTIBODY: HCV Ab: NONREACTIVE

## 2021-03-31 LAB — POTASSIUM: Potassium: 3.5 mmol/L (ref 3.5–5.1)

## 2021-03-31 LAB — HIV ANTIBODY (ROUTINE TESTING W REFLEX): HIV Screen 4th Generation wRfx: NONREACTIVE

## 2021-03-31 LAB — HEPATITIS B SURFACE ANTIGEN: Hepatitis B Surface Ag: NONREACTIVE

## 2021-04-01 ENCOUNTER — Encounter: Payer: Medicaid Other | Admitting: Speech Pathology

## 2021-04-01 LAB — HSV(HERPES SIMPLEX VRS) I + II AB-IGG
HSV 1 Glycoprotein G Ab, IgG: <0.91 index (ref 0.00–0.90)
HSV 2 Glycoprotein G Ab, IgG: <0.91 index (ref 0.00–0.90)

## 2021-04-01 LAB — RPR: RPR Ser Ql: NONREACTIVE

## 2021-04-05 ENCOUNTER — Ambulatory Visit: Payer: Medicaid Other | Admitting: Urgent Care

## 2021-04-05 ENCOUNTER — Other Ambulatory Visit: Payer: Self-pay

## 2021-04-05 ENCOUNTER — Ambulatory Visit
Admission: RE | Admit: 2021-04-05 | Discharge: 2021-04-05 | Disposition: A | Discharge status: Home / Self Care | Payer: Medicaid Other | Attending: Obstetrics and Gynecology | Admitting: Obstetrics and Gynecology

## 2021-04-05 ENCOUNTER — Encounter: Payer: Self-pay | Admitting: Obstetrics and Gynecology

## 2021-04-05 ENCOUNTER — Encounter
Admission: RE | Disposition: A | Discharge status: Home / Self Care | Payer: Self-pay | Source: Home / Self Care | Attending: Obstetrics and Gynecology

## 2021-04-05 DIAGNOSIS — F17210 Nicotine dependence, cigarettes, uncomplicated: Secondary | ICD-10-CM | POA: Insufficient documentation

## 2021-04-05 DIAGNOSIS — D563 Thalassemia minor: Secondary | ICD-10-CM | POA: Diagnosis not present

## 2021-04-05 DIAGNOSIS — F32A Depression, unspecified: Secondary | ICD-10-CM | POA: Diagnosis not present

## 2021-04-05 DIAGNOSIS — N938 Other specified abnormal uterine and vaginal bleeding: Secondary | ICD-10-CM

## 2021-04-05 DIAGNOSIS — I471 Supraventricular tachycardia: Secondary | ICD-10-CM | POA: Insufficient documentation

## 2021-04-05 DIAGNOSIS — G43909 Migraine, unspecified, not intractable, without status migrainosus: Secondary | ICD-10-CM | POA: Diagnosis not present

## 2021-04-05 DIAGNOSIS — N946 Dysmenorrhea, unspecified: Secondary | ICD-10-CM | POA: Diagnosis not present

## 2021-04-05 DIAGNOSIS — I499 Cardiac arrhythmia, unspecified: Secondary | ICD-10-CM | POA: Insufficient documentation

## 2021-04-05 DIAGNOSIS — F419 Anxiety disorder, unspecified: Secondary | ICD-10-CM | POA: Insufficient documentation

## 2021-04-05 HISTORY — PX: DILATION AND CURETTAGE OF UTERUS: SHX78

## 2021-04-05 LAB — POCT PREGNANCY, URINE: Preg Test, Ur: NEGATIVE

## 2021-04-05 SURGERY — DILATION AND CURETTAGE
Anesthesia: General | Site: Uterus

## 2021-04-05 MED ORDER — ACETAMINOPHEN 10 MG/ML IV SOLN
INTRAVENOUS | Status: AC
  Filled 2021-04-05: qty 100

## 2021-04-05 MED ORDER — ORAL CARE MOUTH RINSE: 15.0000 mL | Freq: Once | OROMUCOSAL | Status: AC

## 2021-04-05 MED ORDER — OXYCODONE HCL 5 MG PO TABS: 5.0000 mg | ORAL_TABLET | Freq: Once | ORAL | Status: DC | PRN

## 2021-04-05 MED ORDER — FENTANYL CITRATE (PF) 100 MCG/2ML IJ SOLN
INTRAMUSCULAR | Status: AC
  Filled 2021-04-05: qty 2

## 2021-04-05 MED ORDER — FAMOTIDINE 20 MG PO TABS
ORAL_TABLET | ORAL | Status: AC
  Administered 2021-04-05: 20 mg via ORAL
  Filled 2021-04-05: qty 1

## 2021-04-05 MED ORDER — CHLORHEXIDINE GLUCONATE 0.12 % MT SOLN
OROMUCOSAL | Status: AC
  Administered 2021-04-05: 15 mL via OROMUCOSAL
  Filled 2021-04-05: qty 15

## 2021-04-05 MED ORDER — ONDANSETRON HCL 4 MG/2ML IJ SOLN
INTRAMUSCULAR | Status: DC | PRN
  Administered 2021-04-05: 4 mg via INTRAVENOUS

## 2021-04-05 MED ORDER — ACETAMINOPHEN 10 MG/ML IV SOLN
INTRAVENOUS | Status: DC | PRN
  Administered 2021-04-05: 1000 mg via INTRAVENOUS

## 2021-04-05 MED ORDER — PROPOFOL 10 MG/ML IV BOLUS
INTRAVENOUS | Status: AC
  Filled 2021-04-05: qty 20

## 2021-04-05 MED ORDER — DEXAMETHASONE SODIUM PHOSPHATE 10 MG/ML IJ SOLN
INTRAMUSCULAR | Status: DC | PRN
  Administered 2021-04-05: 10 mg via INTRAVENOUS

## 2021-04-05 MED ORDER — LIDOCAINE HCL (CARDIAC) PF 100 MG/5ML IV SOSY
PREFILLED_SYRINGE | INTRAVENOUS | Status: DC | PRN
Start: 2021-04-05 — End: 2021-04-05
  Administered 2021-04-05: 50 mg via INTRAVENOUS

## 2021-04-05 MED ORDER — LACTATED RINGERS IV SOLN: INTRAVENOUS | Status: DC

## 2021-04-05 MED ORDER — IBUPROFEN 800 MG PO TABS: 800.0000 mg | ORAL_TABLET | Freq: Three times a day (TID) | ORAL | 1 refills | Status: DC | PRN

## 2021-04-05 MED ORDER — FENTANYL CITRATE (PF) 100 MCG/2ML IJ SOLN
INTRAMUSCULAR | Status: DC | PRN
Start: 2021-04-05 — End: 2021-04-05
  Administered 2021-04-05 (×2): 50 ug via INTRAVENOUS

## 2021-04-05 MED ORDER — KETOROLAC TROMETHAMINE 30 MG/ML IJ SOLN
INTRAMUSCULAR | Status: DC | PRN
  Administered 2021-04-05: 30 mg via INTRAVENOUS

## 2021-04-05 MED ORDER — OXYCODONE HCL 5 MG PO TABS: 5.0000 mg | ORAL_TABLET | Freq: Four times a day (QID) | ORAL | 0 refills | Status: DC | PRN

## 2021-04-05 MED ORDER — LIDOCAINE HCL (PF) 1 % IJ SOLN
INTRAMUSCULAR | Status: AC
  Filled 2021-04-05: qty 30

## 2021-04-05 MED ORDER — OXYCODONE HCL 5 MG/5ML PO SOLN: 5.0000 mg | Freq: Once | ORAL | Status: DC | PRN

## 2021-04-05 MED ORDER — MIDAZOLAM HCL 2 MG/2ML IJ SOLN
INTRAMUSCULAR | Status: AC
  Filled 2021-04-05: qty 2

## 2021-04-05 MED ORDER — PROPOFOL 10 MG/ML IV BOLUS
INTRAVENOUS | Status: DC | PRN
  Administered 2021-04-05: 200 mg via INTRAVENOUS

## 2021-04-05 MED ORDER — FENTANYL CITRATE (PF) 100 MCG/2ML IJ SOLN: 25.0000 ug | INTRAMUSCULAR | Status: DC | PRN

## 2021-04-05 MED ORDER — CHLORHEXIDINE GLUCONATE 0.12 % MT SOLN: 15.0000 mL | Freq: Once | OROMUCOSAL | Status: AC

## 2021-04-05 MED ORDER — MIDAZOLAM HCL 2 MG/2ML IJ SOLN
INTRAMUSCULAR | Status: DC | PRN
Start: 2021-04-05 — End: 2021-04-05
  Administered 2021-04-05: 2 mg via INTRAVENOUS

## 2021-04-05 MED ORDER — FAMOTIDINE 20 MG PO TABS: 20.0000 mg | ORAL_TABLET | Freq: Once | ORAL | Status: AC

## 2021-04-05 MED ORDER — LIDOCAINE HCL 1 % IJ SOLN
INTRAMUSCULAR | Status: DC | PRN
  Administered 2021-04-05: 10 mL

## 2021-04-05 SURGICAL SUPPLY — 22 items
DRSG TELFA 3X8 NADH (GAUZE/BANDAGES/DRESSINGS) ×3 IMPLANT
GAUZE 4X4 16PLY ~~LOC~~+RFID DBL (SPONGE) ×6 IMPLANT
GLOVE SURG ENC MOIS LTX SZ6.5 (GLOVE) ×3 IMPLANT
GLOVE SURG UNDER LTX SZ7 (GLOVE) ×3 IMPLANT
GOWN STRL REUS W/ TWL LRG LVL3 (GOWN DISPOSABLE) ×2 IMPLANT
GOWN STRL REUS W/TWL LRG LVL3 (GOWN DISPOSABLE) ×6
KIT TURNOVER CYSTO (KITS) ×3 IMPLANT
MANIFOLD NEPTUNE II (INSTRUMENTS) ×3 IMPLANT
NDL HYPO 25X1 1.5 SAFETY (NEEDLE) ×1 IMPLANT
NDL SPNL 25GX3.5 QUINCKE BL (NEEDLE) ×1 IMPLANT
NEEDLE HYPO 25X1 1.5 SAFETY (NEEDLE) ×3 IMPLANT
NEEDLE SPNL 25GX3.5 QUINCKE BL (NEEDLE) ×3 IMPLANT
NS IRRIG 500ML POUR BTL (IV SOLUTION) ×3 IMPLANT
PACK DNC HYST (MISCELLANEOUS) ×3 IMPLANT
PAD DRESSING TELFA 3X8 NADH (GAUZE/BANDAGES/DRESSINGS) ×1 IMPLANT
PAD OB MATERNITY 4.3X12.25 (PERSONAL CARE ITEMS) ×3 IMPLANT
PAD PREP 24X41 OB/GYN DISP (PERSONAL CARE ITEMS) ×3 IMPLANT
SCRUB EXIDINE 4% CHG 4OZ (MISCELLANEOUS) ×3 IMPLANT
SET CYSTO W/LG BORE CLAMP LF (SET/KITS/TRAYS/PACK) IMPLANT
SYR 10ML LL (SYRINGE) ×3 IMPLANT
SYR CONTROL 10ML LL (SYRINGE) ×2 IMPLANT
WATER STERILE IRR 500ML POUR (IV SOLUTION) ×3 IMPLANT

## 2021-04-05 NOTE — Anesthesia Postprocedure Evaluation (Signed)
Anesthesia Post Note  Patient: FLONNIE WIERMAN  Procedure(s) Performed: DILATATION AND CURETTAGE (Uterus)  Patient location during evaluation: PACU Anesthesia Type: General Level of consciousness: awake and alert Pain management: pain level controlled Vital Signs Assessment: post-procedure vital signs reviewed and stable Respiratory status: spontaneous breathing, nonlabored ventilation, respiratory function stable and patient connected to nasal cannula oxygen Cardiovascular status: blood pressure returned to baseline and stable Postop Assessment: no apparent nausea or vomiting Anesthetic complications: no   No notable events documented.   Last Vitals:  Vitals:   04/05/21 1348 04/05/21 1401  BP: 120/81 117/87  Pulse: 66 78  Resp: 16 18  Temp:  36.6 C  SpO2: 100% 96%    Last Pain:  Vitals:   04/05/21 1401  TempSrc:   PainSc: 0-No pain                 Cleda Mccreedy Cherl Gorney

## 2021-04-05 NOTE — Op Note (Addendum)
Procedure(s): DILATATION AND CURETTAGE Procedure Note  MIATA CULBRETH female 26 y.o. 04/05/2021  Indications: The patient is a 26 y.o. [id number] female with persistent dysfunctional uterine bleeding, dysmenorrhea, failed medical management  Pre-operative Diagnosis: Dysfunctional uterine bleeding, dysmenorrhea, failed medical management  Post-operative Diagnosis: Same  Surgeon: Hildred Laser, MD  Assistants:  None  Anesthesia: General endotracheal anesthesia  Findings: The uterus was sounded to 8 cm Scant fragments of weakly proliferative tissue   Procedure Details: The patient was seen in the Holding Room. The risks, benefits, complications, treatment options, and expected outcomes were discussed with the patient.  The patient concurred with the proposed plan, giving informed consent.  The site of surgery properly noted/marked. The patient was taken to the Operating Room, identified as EMBREE BRAWLEY and the procedure verified as Procedure(s) (LRB): DILATATION AND CURETTAGE (N/A). A Time Out was held and the above information confirmed.  She was then placed under general anesthesia without difficulty. She was placed in the dorsal lithotomy position, and was prepped and draped in a sterile manner.  A straight catheterization was performed. A sterile speculum was inserted into the vagina and the cervix was grasped at the anterior lip using a single-toothed tenaculum.  The uterus was sounded to 8 cm. The cervix was then dilated, and a smooth curette was passed into the uterine cavity and a curettage was performed. Next, the serrated curette was used to perform a second curettage.  The tissue was collected and sent to pathology for evaluation.  The cervix was circumferentially injected with a total of 10 ml of 1% lidocaine solution. The tenaculum was removed from the cervix. Hemostasis was achieved with application of pressure.    The patient tolerated the procedure well. She was awakened from  anesthesia without difficulty and taken to the recovery room in stable condition. Instrument and sponge counts were correct x 2 at the end of the procedure.    Estimated Blood Loss:  5 ml      Drains: straight catheterization prior to procedure with 50 ml of clear urine         Total IV Fluids:  200 ml  Specimens: Endometrial curettings         Implants: None         Complications:  None; patient tolerated the procedure well.         Disposition: PACU - hemodynamically stable.         Condition: stable   Hildred Laser, MD [facility]

## 2021-04-05 NOTE — Anesthesia Procedure Notes (Signed)
Procedure Name: LMA Insertion Date/Time: 04/05/2021 12:36 PM Performed by: Biagio Borg, CRNA Pre-anesthesia Checklist: Patient identified, Emergency Drugs available, Suction available and Patient being monitored Patient Re-evaluated:Patient Re-evaluated prior to induction Oxygen Delivery Method: Circle system utilized Preoxygenation: Pre-oxygenation with 100% oxygen Induction Type: IV induction Ventilation: Mask ventilation without difficulty LMA: LMA inserted LMA Size: 4.0 Tube type: Oral Number of attempts: 1 Placement Confirmation: positive ETCO2 and breath sounds checked- equal and bilateral Tube secured with: Tape Dental Injury: Teeth and Oropharynx as per pre-operative assessment
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
## 2021-04-05 NOTE — Discharge Instructions (Signed)
AMBULATORY SURGERY  ?DISCHARGE INSTRUCTIONS ? ? ?The drugs that you were given will stay in your system until tomorrow so for the next 24 hours you should not: ? ?Drive an automobile ?Make any legal decisions ?Drink any alcoholic beverage ? ? ?You may resume regular meals tomorrow.  Today it is better to start with liquids and gradually work up to solid foods. ? ?You may eat anything you prefer, but it is better to start with liquids, then soup and crackers, and gradually work up to solid foods. ? ? ?Please notify your doctor immediately if you have any unusual bleeding, trouble breathing, redness and pain at the surgery site, drainage, fever, or pain not relieved by medication. ? ? ? ?Additional Instructions: ? ? ? ?Please contact your physician with any problems or Same Day Surgery at [phone], Monday through Friday 6 am to 4 pm, or [place] [facility] [place] Main number at [phone].  ?

## 2021-04-05 NOTE — Transfer of Care (Signed)
Immediate Anesthesia Transfer of Care Note  Patient: Sandra Rivera  Procedure(s) Performed: DILATATION AND CURETTAGE  Patient Location: PACU  Anesthesia Type:General  Level of Consciousness: drowsy  Airway & Oxygen Therapy: Patient Spontanous Breathing  Post-op Assessment: Report given to RN and Post -op Vital signs reviewed and stable  Post vital signs: Reviewed and stable  Last Vitals:  Vitals Value Taken Time  BP 122/85 04/05/21 1312  Temp    Pulse 64 04/05/21 1315  Resp 10 04/05/21 1315  SpO2 99 % 04/05/21 1315  Vitals shown include unvalidated device data.  Last Pain:  Vitals:   04/05/21 1129  TempSrc: Tympanic         Complications: No notable events documented.

## 2021-04-06 ENCOUNTER — Encounter: Payer: Self-pay | Admitting: Obstetrics and Gynecology

## 2021-04-06 LAB — SURGICAL PATHOLOGY

## 2021-04-08 ENCOUNTER — Encounter: Payer: Medicaid Other | Admitting: Speech Pathology

## 2021-04-14 ENCOUNTER — Encounter: Payer: Self-pay | Admitting: Obstetrics and Gynecology

## 2021-04-14 ENCOUNTER — Ambulatory Visit (INDEPENDENT_AMBULATORY_CARE_PROVIDER_SITE_OTHER): Payer: Medicaid Other | Admitting: Obstetrics and Gynecology

## 2021-04-14 ENCOUNTER — Other Ambulatory Visit: Payer: Self-pay

## 2021-04-14 VITALS — BP 133/88 | HR 108 | Ht 67.0 in | Wt 164.0 lb

## 2021-04-14 DIAGNOSIS — Z9889 Other specified postprocedural states: Secondary | ICD-10-CM

## 2021-04-14 DIAGNOSIS — Z09 Encounter for follow-up examination after completed treatment for conditions other than malignant neoplasm: Secondary | ICD-10-CM

## 2021-04-14 DIAGNOSIS — N939 Abnormal uterine and vaginal bleeding, unspecified: Secondary | ICD-10-CM

## 2021-04-14 MED ORDER — BUSPIRONE HCL 10 MG PO TABS: 10.0000 mg | ORAL_TABLET | Freq: Three times a day (TID) | ORAL | 3 refills | Status: AC
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
## 2021-04-15 ENCOUNTER — Encounter: Payer: Medicaid Other | Admitting: Speech Pathology

## 2021-04-22 ENCOUNTER — Encounter: Payer: Medicaid Other | Admitting: Speech Pathology

## 2021-04-29 ENCOUNTER — Encounter: Payer: Medicaid Other | Admitting: Speech Pathology

## 2021-05-06 ENCOUNTER — Encounter: Payer: Medicaid Other | Admitting: Speech Pathology

## 2021-05-13 ENCOUNTER — Encounter: Payer: Medicaid Other | Admitting: Speech Pathology

## 2021-05-13 ENCOUNTER — Other Ambulatory Visit: Payer: Self-pay | Admitting: Obstetrics and Gynecology

## 2021-05-20 ENCOUNTER — Encounter: Payer: Medicaid Other | Admitting: Speech Pathology

## 2021-05-27 ENCOUNTER — Encounter: Payer: Medicaid Other | Admitting: Speech Pathology

## 2021-06-03 ENCOUNTER — Encounter: Payer: Medicaid Other | Admitting: Speech Pathology

## 2021-06-03 IMAGING — CR LUMBAR SPINE - COMPLETE 4+ VIEW
5 series · 5 of 5 positions shown · non-contrast
Comparison: March 05, 2014

CLINICAL DATA: Lumbago with left lower extremity radicular symptoms

EXAM:
LUMBAR SPINE - COMPLETE 4+ VIEW

[l-spine obl (1 of 2)]
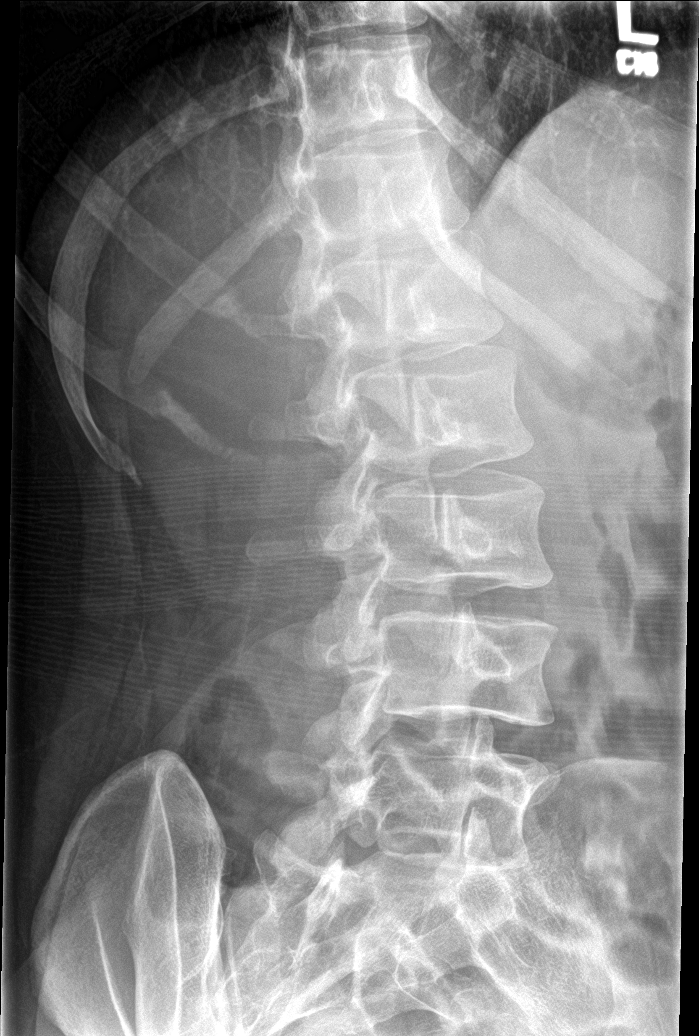

[l-spine obl (2 of 2)]
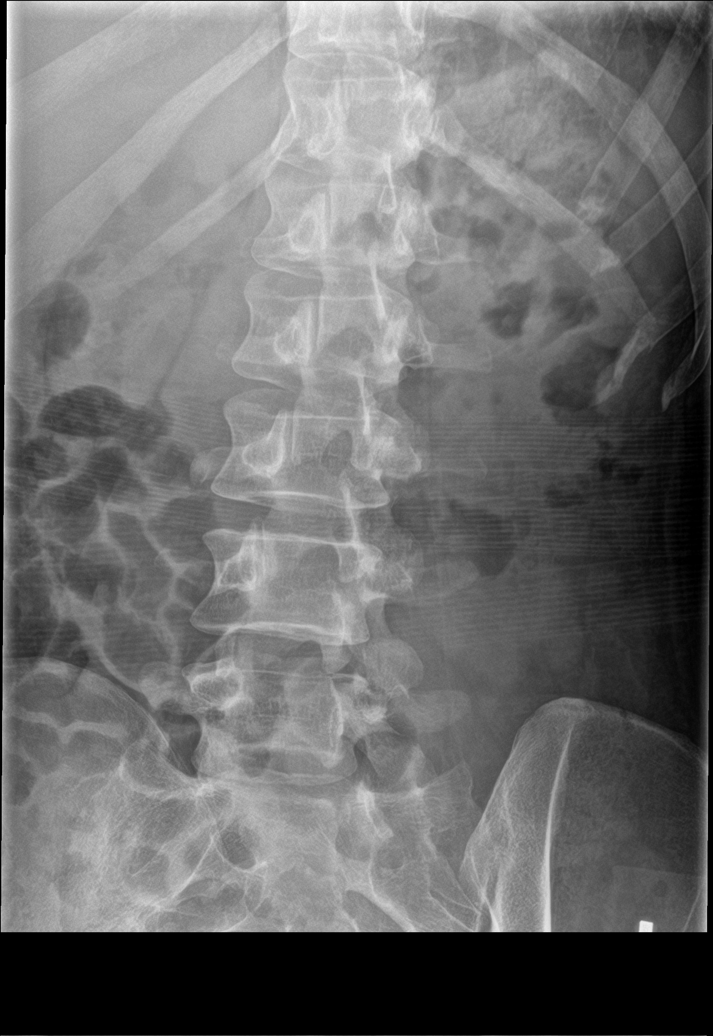

[l-spine lat]
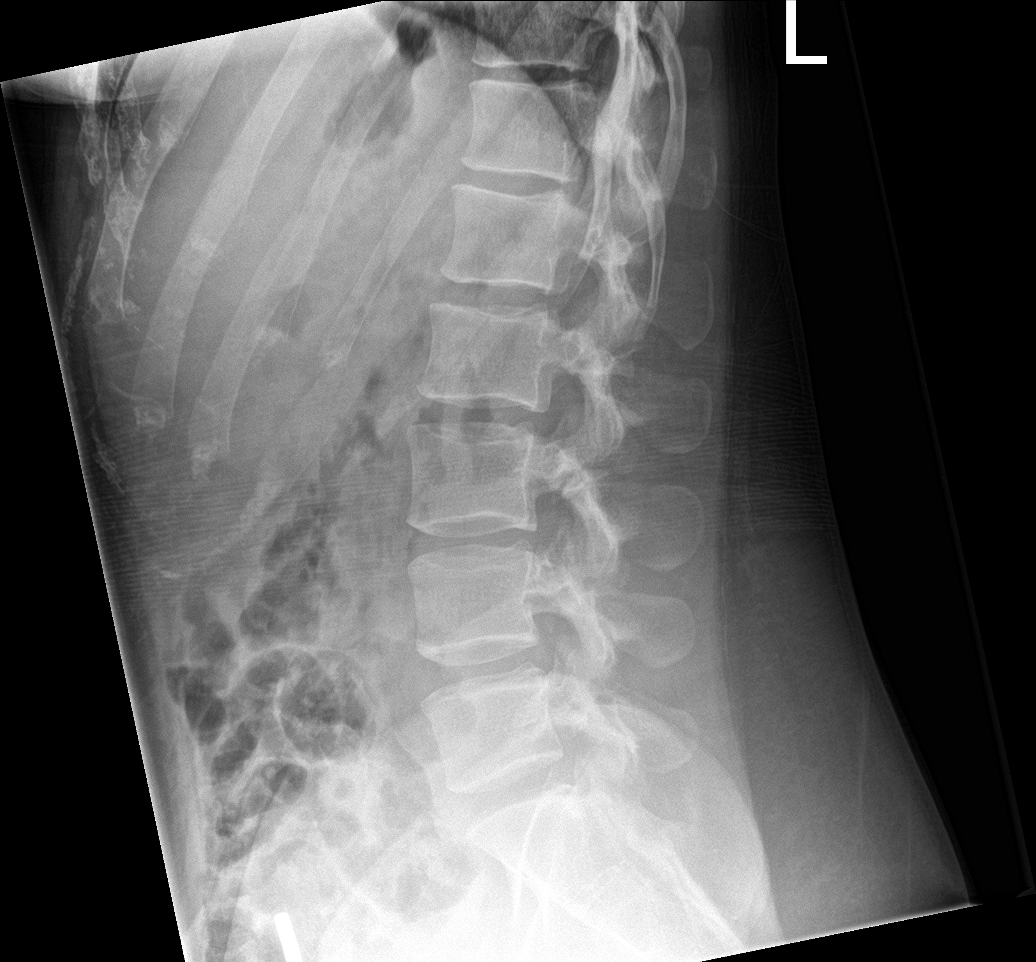

[l-spine spot]
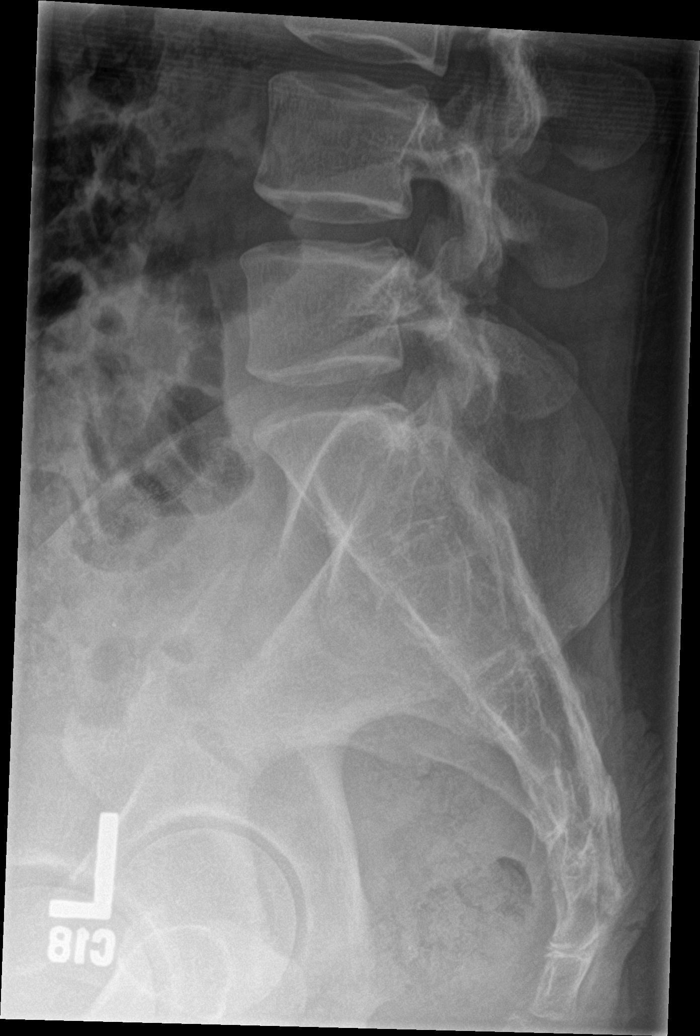

[l-spine ap]
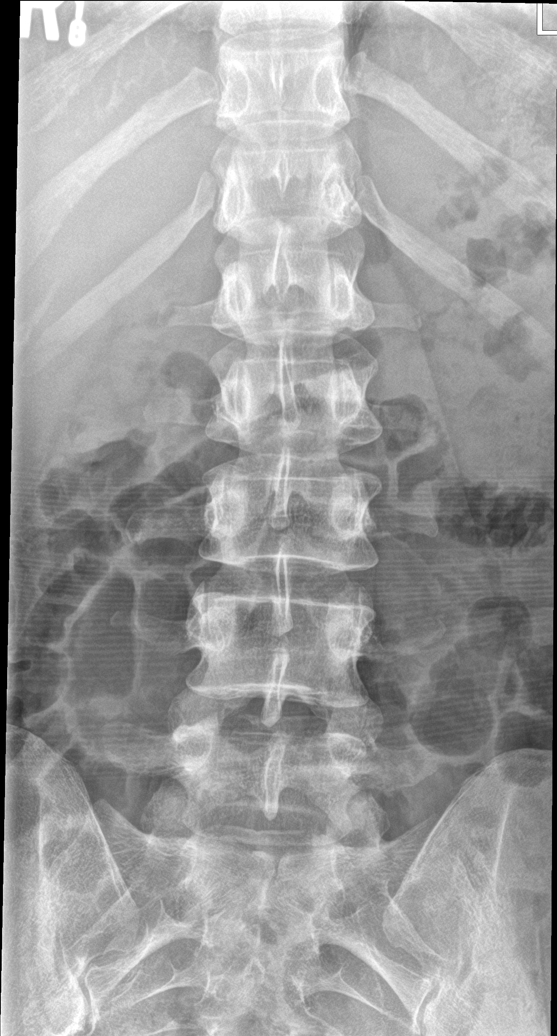

[5 of 5 positions shown; findings below may reference images not displayed]

FINDINGS: Frontal, lateral, spot lumbosacral lateral, and bilateral oblique
views were obtained. There are 5 non-rib-bearing lumbar type
vertebral bodies. There is minimal levoscoliosis. There is no
fracture or spondylolisthesis. The disc spaces appear unremarkable.
There is no appreciable facet arthropathy.
IMPRESSION: Minimal scoliosis. No fracture or spondylolisthesis. No appreciable
arthropathy.

## 2021-06-10 ENCOUNTER — Encounter: Payer: Medicaid Other | Admitting: Speech Pathology

## 2021-07-15 ENCOUNTER — Other Ambulatory Visit: Payer: Self-pay | Admitting: Physical Medicine & Rehabilitation

## 2021-07-15 ENCOUNTER — Other Ambulatory Visit (HOSPITAL_COMMUNITY): Payer: Self-pay | Admitting: Physical Medicine & Rehabilitation

## 2021-07-15 DIAGNOSIS — M542 Cervicalgia: Secondary | ICD-10-CM

## 2021-07-24 ENCOUNTER — Ambulatory Visit
Admission: RE | Admit: 2021-07-24 | Discharge: 2021-07-24 | Disposition: A | Discharge status: Home / Self Care | Payer: Medicaid Other | Source: Ambulatory Visit | Attending: Physical Medicine & Rehabilitation | Admitting: Physical Medicine & Rehabilitation

## 2021-07-24 DIAGNOSIS — M542 Cervicalgia: Secondary | ICD-10-CM | POA: Insufficient documentation

## 2021-08-05 ENCOUNTER — Other Ambulatory Visit: Payer: Self-pay | Admitting: Physical Medicine & Rehabilitation

## 2021-08-05 DIAGNOSIS — M5412 Radiculopathy, cervical region: Secondary | ICD-10-CM

## 2021-11-19 ENCOUNTER — Other Ambulatory Visit: Payer: Medicaid Other

## 2022-01-05 ENCOUNTER — Other Ambulatory Visit: Payer: Self-pay | Admitting: Obstetrics and Gynecology

## 2022-01-10 NOTE — Telephone Encounter (Signed)
Please see if this is something that her PCP would continue to fill as patient receives most of her care there.Or perhaps her Psychiatrist if she is seeing one. I can also fill but she is overdue for her pap smear and needs to be seen for her annual exam if her PCP does not perform paps.

## 2022-01-11 NOTE — Telephone Encounter (Signed)
MyChart msg sent.

## 2022-02-15 MED ORDER — BUSPIRONE HCL 10 MG PO TABS: 10.0000 mg | ORAL_TABLET | Freq: Three times a day (TID) | ORAL | 2 refills | Status: DC

## 2022-02-15 MED ORDER — VENLAFAXINE HCL ER 150 MG PO CP24: 150.0000 mg | ORAL_CAPSULE | Freq: Every day | ORAL | 0 refills | Status: DC

## 2022-03-08 ENCOUNTER — Ambulatory Visit: Payer: Medicaid Other | Admitting: Family Medicine

## 2022-03-17 ENCOUNTER — Encounter: Payer: Self-pay | Admitting: Family Medicine

## 2022-03-17 ENCOUNTER — Ambulatory Visit: Payer: Medicaid Other | Admitting: Family Medicine

## 2022-03-17 VITALS — BP 120/80 | HR 78 | Ht 67.0 in | Wt 193.0 lb

## 2022-03-17 DIAGNOSIS — O4693 Antepartum hemorrhage, unspecified, third trimester: Secondary | ICD-10-CM

## 2022-03-17 DIAGNOSIS — Z7689 Persons encountering health services in other specified circumstances: Secondary | ICD-10-CM

## 2022-03-17 DIAGNOSIS — F419 Anxiety disorder, unspecified: Secondary | ICD-10-CM

## 2022-03-17 DIAGNOSIS — F32A Depression, unspecified: Secondary | ICD-10-CM | POA: Diagnosis not present

## 2022-03-17 HISTORY — DX: Antepartum hemorrhage, unspecified, third trimester: O46.93

## 2022-03-17 MED ORDER — METFORMIN HCL 500 MG PO TABS: 500.0000 mg | ORAL_TABLET | Freq: Two times a day (BID) | ORAL | 3 refills | Status: DC

## 2022-03-17 MED ORDER — NALTREXONE-BUPROPION HCL ER 8-90 MG PO TB12: ORAL_TABLET | ORAL | 0 refills | Status: DC

## 2022-03-17 MED ORDER — TOPIRAMATE 25 MG PO CPSP: 25.0000 mg | ORAL_CAPSULE | Freq: Every day | ORAL | 0 refills | Status: DC

## 2022-03-17 NOTE — Assessment & Plan Note (Signed)
Chronic condition, has noted difficulty losing weight despite reported dietary changes and increased exercises.  Has had moderate success in the past with phentermine.  Given her ongoing comorbid medical conditions we reviewed several medications for weight management that would be compatible with her medical history.  Plan to initiate metformin, Contrave, topiramate, close follow-up in 1 month for weight check and medication tolerability assessment.  If doing well, plan to further titrate these medications as clinically guided.  I did encourage patient to continue with healthy lifestyle changes for the best response.

## 2022-03-17 NOTE — Assessment & Plan Note (Signed)
>>  ASSESSMENT AND PLAN FOR ENCOUNTER FOR WEIGHT MANAGEMENT WRITTEN ON 03/17/2022 11:33 AM BY Adaiah Jaskot J, MD  Chronic condition, has noted difficulty losing weight despite reported dietary changes and increased exercises.  Has had moderate success in the past with phentermine.  Given her ongoing comorbid medical conditions we reviewed several medications for weight management that would be compatible with her medical history.  Plan to initiate metformin , Contrave , topiramate , close follow-up in 1 month for weight check and medication tolerability assessment.  If doing well, plan to further titrate these medications as clinically guided.  I did encourage patient to continue with healthy lifestyle changes for the best response.

## 2022-03-17 NOTE — Progress Notes (Signed)
Primary Care / Sports Medicine Office Visit  Patient Information:  Patient ID: Sandra Rivera, female DOB: 03/31/1995 Age: 27 y.o. MRN: [id number]   Sandra Rivera is a pleasant 27 y.o. female presenting with the following:  Chief Complaint  Patient presents with   Establish Care   Weight Management Screening    Vitals:   03/17/22 1021  BP: 120/80  Pulse: 78  SpO2: 99%   Vitals:   03/17/22 1021  Weight: 193 lb (87.5 kg)  Height: 5\' 7"  (1.702 m)   Body mass index is 30.23 kg/m.  No results found.   Independent interpretation of notes and tests performed by another provider:   None  Procedures performed:   None  Pertinent History, Exam, Impression, and Recommendations:   Sandra Rivera was seen today for establish care and weight management screening.  Anxiety and depression Overview: Prior medication(s):  Zoloft 25 mg qd, discontinued after only several weeks of use in 2nd trimester  Assessment & Plan: Chronic condition, ongoing symptomatology, patient reports anxiety/depressive symptoms since childhood, has been on multiple medications that patient cannot recall, chart review reveals prior usage of Zoloft.  Currently on buspirone and venlafaxine XR, finds these medications are not fully controlling her symptoms.  Does have upcoming significant life stressors (separation, moving homes, etc.).  Given her clinical presentation I have advised further evaluation and management by psychiatry, referral placed today.  Additionally she has expressed interest in cognitive behavioral therapy, to be coordinated by psychiatry.  Will follow peripherally on this issue.  Orders: -     Ambulatory referral to Psychiatry  Encounter for weight management Assessment & Plan: Chronic condition, has noted difficulty losing weight despite reported dietary changes and increased exercises.  Has had moderate success in the past with phentermine.  Given her ongoing comorbid medical conditions  we reviewed several medications for weight management that would be compatible with her medical history.  Plan to initiate metformin, Contrave, topiramate, close follow-up in 1 month for weight check and medication tolerability assessment.  If doing well, plan to further titrate these medications as clinically guided.  I did encourage patient to continue with healthy lifestyle changes for the best response.  Orders: -     Naltrexone-buPROPion HCl ER; 1 tab daily for week 1, then 1 tab BID for week 2, then 2 tab PO qAM and 1 tab PO qPM for week 3, then 2 tabs BID.  Dispense: 120 tablet; Refill: 0 -     metFORMIN HCl; Take 1 tablet (500 mg total) by mouth 2 (two) times daily with a meal.  Dispense: 180 tablet; Refill: 3 -     Topiramate; Take 1 capsule (25 mg total) by mouth daily.  Dispense: 30 capsule; Refill: 0     Orders & Medications Meds ordered this encounter  Medications   Naltrexone-buPROPion HCl ER 8-90 MG TB12    Sig: 1 tab daily for week 1, then 1 tab BID for week 2, then 2 tab PO qAM and 1 tab PO qPM for week 3, then 2 tabs BID.    Dispense:  120 tablet    Refill:  0   metFORMIN (GLUCOPHAGE) 500 MG tablet    Sig: Take 1 tablet (500 mg total) by mouth 2 (two) times daily with a meal.    Dispense:  180 tablet    Refill:  3   topiramate (TOPAMAX) 25 MG capsule    Sig: Take 1 capsule (25 mg total) by mouth daily.  Dispense:  30 capsule    Refill:  0   Orders Placed This Encounter  Procedures   Ambulatory referral to Psychiatry     Return in about 4 weeks (around 04/14/2022).     Montel Culver, MD, [facility]   Primary Care Sports Medicine Primary Care and Sports Medicine at [facility]

## 2022-03-17 NOTE — Assessment & Plan Note (Signed)
Chronic condition, ongoing symptomatology, patient reports anxiety/depressive symptoms since childhood, has been on multiple medications that patient cannot recall, chart review reveals prior usage of Zoloft.  Currently on buspirone and venlafaxine XR, finds these medications are not fully controlling her symptoms.  Does have upcoming significant life stressors (separation, moving homes, etc.).  Given her clinical presentation I have advised further evaluation and management by psychiatry, referral placed today.  Additionally she has expressed interest in cognitive behavioral therapy, to be coordinated by psychiatry.  Will follow peripherally on this issue.

## 2022-03-17 NOTE — Patient Instructions (Signed)
-   Referral coordinator will contact you to coordinate follow-up with psychiatry - Continue current medications for mood - Start the following new weight loss medications per Rx package instructions: Metformin Contrave Topiramate - Review information regarding IBS attached to chart - Return for follow-up in 4 weeks - Contact us for questions/concerns between now and then

## 2022-03-31 ENCOUNTER — Other Ambulatory Visit: Payer: Self-pay | Admitting: Family Medicine

## 2022-03-31 DIAGNOSIS — Z7689 Persons encountering health services in other specified circumstances: Secondary | ICD-10-CM

## 2022-03-31 NOTE — Telephone Encounter (Signed)
Requested medications are due for refill today.  Unsure  Requested medications are on the active medications list.  yes  Last refill. 03/17/2022 #30 0 rf  Future visit scheduled.   no  Notes to clinic.  Labs are expired.    Requested Prescriptions  Pending Prescriptions Disp Refills   topiramate (TOPAMAX) 25 MG capsule [Pharmacy Med Name: TOPIRAMATE 25 MG CAP] 30 capsule 0    Sig: TAKE 1 CAPSULE BY MOUTH ONCE DAILY     Neurology: Anticonvulsants - topiramate & zonisamide Failed - 03/31/2022 10:11 AM      Failed - Cr in normal range and within 360 days    Creatinine, Ser  Date Value Ref Range Status  09/03/2018 0.85 0.44 - 1.00 mg/dL Final   Creatinine, Urine  Date Value Ref Range Status  02/11/2015 146 mg/dL Final         Failed - CO2 in normal range and within 360 days    CO2  Date Value Ref Range Status  09/03/2018 22 22 - 32 mmol/L Final         Failed - ALT in normal range and within 360 days    ALT  Date Value Ref Range Status  09/03/2018 20 0 - 44 U/L Final         Failed - AST in normal range and within 360 days    AST  Date Value Ref Range Status  09/03/2018 22 15 - 41 U/L Final         Passed - Completed PHQ-2 or PHQ-9 in the last 360 days      Passed - Valid encounter within last 12 months    Recent Outpatient Visits           2 weeks ago Anxiety and depression   [place] [facility] at [facility], Earley Abide, MD

## 2022-04-14 ENCOUNTER — Ambulatory Visit: Payer: Medicaid Other | Admitting: Family Medicine

## 2022-04-20 ENCOUNTER — Other Ambulatory Visit: Payer: Self-pay | Admitting: Family Medicine

## 2022-04-20 ENCOUNTER — Other Ambulatory Visit: Payer: Self-pay

## 2022-04-20 DIAGNOSIS — Z7689 Persons encountering health services in other specified circumstances: Secondary | ICD-10-CM

## 2022-04-20 NOTE — Telephone Encounter (Signed)
Requested medication (s) are due for refill today - yes  Requested medication (s) are on the active medication list -yes  Future visit scheduled -yes  Last refill: 03/17/22 #30  Notes to clinic: fail lab protocol for RF- over 1 year-2020  Requested Prescriptions  Pending Prescriptions Disp Refills   topiramate (TOPAMAX) 25 MG capsule [Pharmacy Med Name: TOPIRAMATE 25 MG CAP] 30 capsule 0    Sig: TAKE 1 CAPSULE BY MOUTH ONCE DAILY     Neurology: Anticonvulsants - topiramate & zonisamide Failed - 04/20/2022  8:40 AM      Failed - Cr in normal range and within 360 days    Creatinine, Ser  Date Value Ref Range Status  09/03/2018 0.85 0.44 - 1.00 mg/dL Final   Creatinine, Urine  Date Value Ref Range Status  02/11/2015 146 mg/dL Final         Failed - CO2 in normal range and within 360 days    CO2  Date Value Ref Range Status  09/03/2018 22 22 - 32 mmol/L Final         Failed - ALT in normal range and within 360 days    ALT  Date Value Ref Range Status  09/03/2018 20 0 - 44 U/L Final         Failed - AST in normal range and within 360 days    AST  Date Value Ref Range Status  09/03/2018 22 15 - 41 U/L Final         Passed - Completed PHQ-2 or PHQ-9 in the last 360 days      Passed - Valid encounter within last 12 months    Recent Outpatient Visits           1 month ago Anxiety and depression   [place] [facility] at [place], Earley Abide, MD       Future Appointments             In 5 days Montel Culver, MD [facility] Primary Care & Sports Medicine at [facility], [facility]               Requested Prescriptions  Pending Prescriptions Disp Refills   topiramate (TOPAMAX) 25 MG capsule [Pharmacy Med Name: TOPIRAMATE 25 MG CAP] 30 capsule 0    Sig: TAKE 1 CAPSULE BY MOUTH ONCE DAILY     Neurology: Anticonvulsants - topiramate & zonisamide Failed - 04/20/2022  8:40 AM      Failed - Cr in normal range and within 360 days     Creatinine, Ser  Date Value Ref Range Status  09/03/2018 0.85 0.44 - 1.00 mg/dL Final   Creatinine, Urine  Date Value Ref Range Status  02/11/2015 146 mg/dL Final         Failed - CO2 in normal range and within 360 days    CO2  Date Value Ref Range Status  09/03/2018 22 22 - 32 mmol/L Final         Failed - ALT in normal range and within 360 days    ALT  Date Value Ref Range Status  09/03/2018 20 0 - 44 U/L Final         Failed - AST in normal range and within 360 days    AST  Date Value Ref Range Status  09/03/2018 22 15 - 41 U/L Final         Passed - Completed PHQ-2 or PHQ-9 in the last  360 days      Passed - Valid encounter within last 12 months    Recent Outpatient Visits           1 month ago Anxiety and depression   [place] [facility] at [place], MD       Future Appointments             In 5 days Zigmund Daniel, Earley Abide, MD [place] at [facility], [facility]

## 2022-04-25 ENCOUNTER — Ambulatory Visit: Payer: Medicaid Other | Admitting: Family Medicine

## 2022-04-25 ENCOUNTER — Encounter: Payer: Self-pay | Admitting: Family Medicine

## 2022-04-25 VITALS — BP 120/80 | HR 91 | Ht 67.0 in | Wt 194.0 lb

## 2022-04-25 DIAGNOSIS — K5909 Other constipation: Secondary | ICD-10-CM | POA: Diagnosis not present

## 2022-04-25 DIAGNOSIS — F32A Depression, unspecified: Secondary | ICD-10-CM

## 2022-04-25 DIAGNOSIS — F419 Anxiety disorder, unspecified: Secondary | ICD-10-CM | POA: Diagnosis not present

## 2022-04-25 DIAGNOSIS — Z7689 Persons encountering health services in other specified circumstances: Secondary | ICD-10-CM | POA: Diagnosis not present

## 2022-04-26 ENCOUNTER — Encounter: Payer: Self-pay | Admitting: Family Medicine

## 2022-04-26 MED ORDER — BUPROPION HCL ER (XL) 150 MG PO TB24: 150.0000 mg | ORAL_TABLET | ORAL | 0 refills | Status: DC

## 2022-04-26 MED ORDER — TOPIRAMATE 25 MG PO CPSP: 25.0000 mg | ORAL_CAPSULE | Freq: Two times a day (BID) | ORAL | 1 refills | Status: DC

## 2022-04-26 MED ORDER — METFORMIN HCL 1000 MG PO TABS: 1000.0000 mg | ORAL_TABLET | Freq: Two times a day (BID) | ORAL | 1 refills | Status: DC

## 2022-04-26 NOTE — Assessment & Plan Note (Signed)
Tolerating metformin and topiramate without issues, no weight loss noted, unable to obtain naltrexone-bupropion due to insurance barriers.  Eager to lose weight for which I have encouraged tight adherence to lifestyle modifications, incorporation of calorie counting.  We discussed the risk profile of phentermine given her comorbid medical conditions and will instead titrate metformin, topiramate, initiate bupropion, and place referral to nutritionist.  Will coordinate follow-up in 2 months to assess her interim response.

## 2022-04-26 NOTE — Patient Instructions (Signed)
-   Take new higher dose of metformin - Increase topiramate to twice daily - Start Wellbutrin/bupropion daily - Work on calorie counting as discussed - Referral coordinator will contact you to schedule nutritionist virtual visit - Maintain follow-up with psychiatry - Return for follow-up in 2 months

## 2022-04-26 NOTE — Assessment & Plan Note (Signed)
Chronic, ongoing, minimal improvement following metformin initiation without resolution.  Continued lifestyle modifications encouraged.

## 2022-04-26 NOTE — Assessment & Plan Note (Addendum)
Chronic condition, stable on current regimen, has upcoming visit with psychiatry.  Adding Wellbutrin primarily for weight management, may note tighter control after initiation.  Will follow peripherally on this issue.

## 2022-04-26 NOTE — Progress Notes (Signed)
Primary Care / Sports Medicine Office Visit  Patient Information:  Patient ID: Sandra Rivera, female DOB: 10/07/1995 Age: 27 y.o. MRN: [id number]   Sandra Rivera is a pleasant 27 y.o. female presenting with the following:  Chief Complaint  Patient presents with   Anxiety   Depression   Weight Check    Vitals:   04/25/22 1519  BP: 120/80  Pulse: 91  SpO2: 98%   Vitals:   04/25/22 1519  Weight: 194 lb (88 kg)  Height: 5' 7"$  (1.702 m)   Body mass index is 30.38 kg/m.  No results found.   Independent interpretation of notes and tests performed by another provider:   None  Procedures performed:   None  Pertinent History, Exam, Impression, and Recommendations:   Sandra Rivera was seen today for anxiety, depression and weight check.  Encounter for weight management Assessment & Plan: Tolerating metformin and topiramate without issues, no weight loss noted, unable to obtain naltrexone-bupropion due to insurance barriers.  Eager to lose weight for which I have encouraged tight adherence to lifestyle modifications, incorporation of calorie counting.  We discussed the risk profile of phentermine given her comorbid medical conditions and will instead titrate metformin, topiramate, initiate bupropion, and place referral to nutritionist.  Will coordinate follow-up in 2 months to assess her interim response.  Orders: -     buPROPion HCl ER (XL); Take 1 tablet (150 mg total) by mouth every morning.  Dispense: 90 tablet; Refill: 0 -     metFORMIN HCl; Take 1 tablet (1,000 mg total) by mouth 2 (two) times daily with a meal.  Dispense: 60 tablet; Refill: 1 -     Topiramate; Take 1 capsule (25 mg total) by mouth 2 (two) times daily.  Dispense: 60 capsule; Refill: 1 -     Amb ref to Medical Nutrition Therapy-MNT  Chronic constipation Assessment & Plan: Chronic, ongoing, minimal improvement following metformin initiation without resolution.  Continued lifestyle modifications  encouraged.  Orders: -     Amb ref to Medical Nutrition Therapy-MNT  Anxiety and depression Overview: Prior medication(s):  Zoloft 25 mg qd, discontinued after only several weeks of use in 2nd trimester  Assessment & Plan: Chronic condition, stable on current regimen, has upcoming visit with psychiatry.  Adding Wellbutrin primarily for weight management, may note tighter control after initiation.  Will follow peripherally on this issue.  Orders: -     buPROPion HCl ER (XL); Take 1 tablet (150 mg total) by mouth every morning.  Dispense: 90 tablet; Refill: 0     Orders & Medications Meds ordered this encounter  Medications   buPROPion (WELLBUTRIN XL) 150 MG 24 hr tablet    Sig: Take 1 tablet (150 mg total) by mouth every morning.    Dispense:  90 tablet    Refill:  0   metFORMIN (GLUCOPHAGE) 1000 MG tablet    Sig: Take 1 tablet (1,000 mg total) by mouth 2 (two) times daily with a meal.    Dispense:  60 tablet    Refill:  1   topiramate (TOPAMAX) 25 MG capsule    Sig: Take 1 capsule (25 mg total) by mouth 2 (two) times daily.    Dispense:  60 capsule    Refill:  1   Orders Placed This Encounter  Procedures   Amb ref to Medical Nutrition Therapy-MNT     Return in about 2 months (around 06/24/2022).     Montel Culver, MD, [facility]  Primary Care Sports Medicine Primary Care and Sports Medicine at [facility]

## 2022-04-26 NOTE — Assessment & Plan Note (Signed)
>>  ASSESSMENT AND PLAN FOR ENCOUNTER FOR WEIGHT MANAGEMENT WRITTEN ON 04/26/2022  1:04 PM BY Allyce Bochicchio J, MD  Tolerating metformin  and topiramate  without issues, no weight loss noted, unable to obtain naltrexone -bupropion  due to insurance barriers.  Eager to lose weight for which I have encouraged tight adherence to lifestyle modifications, incorporation of calorie counting.  We discussed the risk profile of phentermine given her comorbid medical conditions and will instead titrate metformin , topiramate , initiate bupropion , and place referral to nutritionist.  Will coordinate follow-up in 2 months to assess her interim response.

## 2022-05-02 ENCOUNTER — Other Ambulatory Visit: Payer: Self-pay

## 2022-05-02 MED ORDER — VENLAFAXINE HCL ER 150 MG PO CP24: 150.0000 mg | ORAL_CAPSULE | Freq: Every day | ORAL | 0 refills | Status: DC

## 2022-05-03 ENCOUNTER — Encounter: Payer: Self-pay | Admitting: Psychiatry

## 2022-05-03 ENCOUNTER — Other Ambulatory Visit
Admission: RE | Admit: 2022-05-03 | Discharge: 2022-05-03 | Disposition: A | Discharge status: Home / Self Care | Payer: Medicaid Other | Source: Ambulatory Visit | Attending: Psychiatry | Admitting: Psychiatry

## 2022-05-03 ENCOUNTER — Ambulatory Visit (INDEPENDENT_AMBULATORY_CARE_PROVIDER_SITE_OTHER): Payer: Medicaid Other | Admitting: Psychiatry

## 2022-05-03 VITALS — BP 119/79 | HR 93 | Temp 98.5°F | Ht 67.0 in | Wt 191.4 lb

## 2022-05-03 DIAGNOSIS — F32A Depression, unspecified: Secondary | ICD-10-CM | POA: Diagnosis not present

## 2022-05-03 DIAGNOSIS — F411 Generalized anxiety disorder: Secondary | ICD-10-CM | POA: Insufficient documentation

## 2022-05-03 DIAGNOSIS — F439 Reaction to severe stress, unspecified: Secondary | ICD-10-CM

## 2022-05-03 LAB — TSH: TSH: 0.849 u[IU]/mL (ref 0.350–4.500)

## 2022-05-03 MED ORDER — TRAZODONE HCL 50 MG PO TABS: 25.0000 mg | ORAL_TABLET | Freq: Every day | ORAL | 1 refills | Status: DC

## 2022-05-03 MED ORDER — BUSPIRONE HCL 15 MG PO TABS: 15.0000 mg | ORAL_TABLET | Freq: Three times a day (TID) | ORAL | 1 refills | Status: DC

## 2022-05-03 NOTE — Patient Instructions (Signed)
Trazodone Tablets What is this medication? TRAZODONE (TRAZ oh done) treats depression. It increases the amount of serotonin in the brain, a hormone that helps regulate mood. This medicine may be used for other purposes; ask your health care provider or pharmacist if you have questions. COMMON BRAND NAME(S): Desyrel What should I tell my care team before I take this medication? They need to know if you have any of these conditions: Attempted suicide or thinking about it Bipolar disorder Bleeding problems Glaucoma Heart disease, or previous heart attack Irregular heart beat Kidney or liver disease Low levels of sodium in the blood An unusual or allergic reaction to trazodone, other medications, foods, dyes or preservatives Pregnant or trying to get pregnant Breast-feeding How should I use this medication? Take this medication by mouth with a glass of water. Follow the directions on the prescription label. Take this medication shortly after a meal or a light snack. Take your medication at regular intervals. Do not take your medication more often than directed. Do not stop taking this medication suddenly except upon the advice of your care team. Stopping this medication too quickly may cause serious side effects or your condition may worsen. A special MedGuide will be given to you by the pharmacist with each prescription and refill. Be sure to read this information carefully each time. Talk to your care team regarding the use of this medication in children. Special care may be needed. Overdosage: If you think you have taken too much of this medicine contact a poison control center or emergency room at once. NOTE: This medicine is only for you. Do not share this medicine with others. What if I miss a dose? If you miss a dose, take it as soon as you can. If it is almost time for your next dose, take only that dose. Do not take double or extra doses. What may interact with this medication? Do not  take this medication with any of the following: Certain medications for fungal infections like fluconazole, itraconazole, ketoconazole, posaconazole, voriconazole Cisapride Dronedarone Linezolid MAOIs like Carbex, Eldepryl, Marplan, Nardil, and Parnate Mesoridazine Methylene blue (injected into a vein) Pimozide Saquinavir Thioridazine This medication may also interact with the following: Alcohol Antiviral medications for HIV or AIDS Aspirin and aspirin-like medications Barbiturates like phenobarbital Certain medications for blood pressure, heart disease, irregular heart beat Certain medications for depression, anxiety, or psychotic disturbances Certain medications for migraine headache like almotriptan, eletriptan, frovatriptan, naratriptan, rizatriptan, sumatriptan, zolmitriptan Certain medications for seizures like carbamazepine and phenytoin Certain medications for sleep Certain medications that treat or prevent blood clots like dalteparin, enoxaparin, warfarin Digoxin Fentanyl Lithium NSAIDS, medications for pain and inflammation, like ibuprofen or naproxen Other medications that prolong the QT interval (cause an abnormal heart rhythm) like dofetilide Rasagiline Supplements like St. John's wort, kava kava, valerian Tramadol Tryptophan This list may not describe all possible interactions. Give your health care provider a list of all the medicines, herbs, non-prescription drugs, or dietary supplements you use. Also tell them if you smoke, drink alcohol, or use illegal drugs. Some items may interact with your medicine. What should I watch for while using this medication? Tell your care team if your symptoms do not get better or if they get worse. Visit your care team for regular checks on your progress. Because it may take several weeks to see the full effects of this medication, it is important to continue your treatment as prescribed by your care team. Watch for new or worsening  thoughts of   suicide or depression. This includes sudden changes in mood, behaviors, or thoughts. These changes can happen at any time but are more common in the beginning of treatment or after a change in dose. Call your care team right away if you experience these thoughts or worsening depression. Manic episodes may happen in patients with bipolar disorder who take this medication. Watch for changes in feelings or behaviors such as feeling anxious, nervous, agitated, panicky, irritable, hostile, aggressive, impulsive, severely restless, overly excited and hyperactive, or trouble sleeping. These changes can happen at any time but are more common in the beginning of treatment or after a change in dose. Call your care team right away if you notice any of these symptoms. You may get drowsy or dizzy. Do not drive, use machinery, or do anything that needs mental alertness until you know how this medication affects you. Do not stand or sit up quickly, especially if you are an older patient. This reduces the risk of dizzy or fainting spells. Alcohol may interfere with the effect of this medication. Avoid alcoholic drinks. This medication may cause dry eyes and blurred vision. If you wear contact lenses you may feel some discomfort. Lubricating drops may help. See your eye doctor if the problem does not go away or is severe. Your mouth may get dry. Chewing sugarless gum, sucking hard candy and drinking plenty of water may help. Contact your care team if the problem does not go away or is severe. What side effects may I notice from receiving this medication? Side effects that you should report to your care team as soon as possible: Allergic reactions--skin rash, itching, hives, swelling of the face, lips, tongue, or throat Bleeding--bloody or black, tar-like stools, red or dark brown urine, vomiting blood or brown material that looks like coffee grounds, small, red or purple spots on skin, unusual bleeding or  bruising Heart rhythm changes--fast or irregular heartbeat, dizziness, feeling faint or lightheaded, chest pain, trouble breathing Low blood pressure--dizziness, feeling faint or lightheaded, blurry vision Low sodium level--muscle weakness, fatigue, dizziness, headache, confusion Prolonged or painful erection Serotonin syndrome--irritability, confusion, fast or irregular heartbeat, muscle stiffness, twitching muscles, sweating, high fever, seizures, chills, vomiting, diarrhea Sudden eye pain or change in vision such as blurry vision, seeing halos around lights, vision loss Thoughts of suicide or self-harm, worsening mood, feelings of depression Side effects that usually do not require medical attention (report to your care team if they continue or are bothersome): Change in sex drive or performance Constipation Dizziness Drowsiness Dry mouth This list may not describe all possible side effects. Call your doctor for medical advice about side effects. You may report side effects to FDA at 1-800-FDA-1088. Where should I keep my medication? Keep out of the reach of children and pets. Store at room temperature between 15 and 30 degrees C (59 to 86 degrees F). Protect from light. Keep container tightly closed. Throw away any unused medication after the expiration date. NOTE: This sheet is a summary. It may not cover all possible information. If you have questions about this medicine, talk to your doctor, pharmacist, or health care provider.  2023 Elsevier/Gold Standard (2020-02-19 00:00:00)  
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
## 2022-05-24 ENCOUNTER — Ambulatory Visit: Payer: Self-pay | Admitting: Psychiatry

## 2022-06-08 ENCOUNTER — Ambulatory Visit (INDEPENDENT_AMBULATORY_CARE_PROVIDER_SITE_OTHER): Payer: Medicaid Other | Admitting: Family Medicine

## 2022-06-08 ENCOUNTER — Ambulatory Visit: Payer: Medicaid Other | Admitting: Family Medicine

## 2022-06-08 ENCOUNTER — Encounter: Payer: Self-pay | Admitting: Family Medicine

## 2022-06-08 VITALS — BP 126/72 | HR 96 | Ht 67.0 in | Wt 195.0 lb

## 2022-06-08 DIAGNOSIS — J0140 Acute pansinusitis, unspecified: Secondary | ICD-10-CM

## 2022-06-08 DIAGNOSIS — F32A Depression, unspecified: Secondary | ICD-10-CM | POA: Diagnosis not present

## 2022-06-08 DIAGNOSIS — F419 Anxiety disorder, unspecified: Secondary | ICD-10-CM

## 2022-06-08 DIAGNOSIS — Z7689 Persons encountering health services in other specified circumstances: Secondary | ICD-10-CM | POA: Diagnosis not present

## 2022-06-08 DIAGNOSIS — F411 Generalized anxiety disorder: Secondary | ICD-10-CM

## 2022-06-08 MED ORDER — TOPIRAMATE 25 MG PO CPSP: 25.0000 mg | ORAL_CAPSULE | Freq: Two times a day (BID) | ORAL | 1 refills | Status: DC

## 2022-06-08 MED ORDER — METFORMIN HCL 1000 MG PO TABS: 1000.0000 mg | ORAL_TABLET | Freq: Two times a day (BID) | ORAL | 1 refills | Status: DC

## 2022-06-08 MED ORDER — AZITHROMYCIN 250 MG PO TABS: ORAL_TABLET | ORAL | 0 refills | Status: AC

## 2022-06-08 MED ORDER — BUPROPION HCL ER (XL) 150 MG PO TB24: 150.0000 mg | ORAL_TABLET | ORAL | 1 refills | Status: DC

## 2022-06-08 MED ORDER — BUSPIRONE HCL 15 MG PO TABS: 15.0000 mg | ORAL_TABLET | Freq: Three times a day (TID) | ORAL | 1 refills | Status: DC

## 2022-06-08 NOTE — Patient Instructions (Signed)
-   See medication list provided and take medication as prescribed - Keep visits with psychiatry and nutritionist - Take antibiotics for full course - Return in 1 month for physical

## 2022-06-13 ENCOUNTER — Encounter: Payer: Self-pay | Admitting: Family Medicine

## 2022-06-13 DIAGNOSIS — J0140 Acute pansinusitis, unspecified: Secondary | ICD-10-CM | POA: Insufficient documentation

## 2022-06-13 HISTORY — DX: Acute pansinusitis, unspecified: J01.40

## 2022-06-13 NOTE — Progress Notes (Signed)
Primary Care / Sports Medicine Office Visit  Patient Information:  Patient ID: Sandra Rivera, female DOB: 06-13-1995 Age: 27 y.o. MRN: [id number]   Sandra Rivera is a pleasant 27 y.o. female presenting with the following:  Chief Complaint  Patient presents with   Weight Management Screening   Anxiety    Vitals:   06/08/22 1326  BP: 126/72  Pulse: 96  SpO2: 97%   Vitals:   06/08/22 1326  Weight: 195 lb (88.5 kg)  Height: 5\' 7"  (1.702 m)   Body mass index is 30.54 kg/m.  No results found.   Independent interpretation of notes and tests performed by another provider:   None  Procedures performed:   None  Pertinent History, Exam, Impression, and Recommendations:   Sandra Rivera was seen today for weight management screening and anxiety.  Acute non-recurrent pansinusitis Assessment & Plan: Several day history of worsening sinus pressure  Findings most consistent with pansinusitis, given recent urgent care visit and persistent symptoms, plan as follows: - Start azithromycin - Supportive care   Encounter for weight management Assessment & Plan: Stable weight, compliance issues with medications.  Plan as follows: - Reinforce medication compliance - Continue current regimen - Close follow-up  Orders: -     Topiramate; Take 1 capsule (25 mg total) by mouth 2 (two) times daily.  Dispense: 60 capsule; Refill: 1 -     metFORMIN HCl; Take 1 tablet (1,000 mg total) by mouth 2 (two) times daily with a meal.  Dispense: 60 tablet; Refill: 1 -     buPROPion HCl ER (XL); Take 1 tablet (150 mg total) by mouth every morning.  Dispense: 30 tablet; Refill: 1  Anxiety and depression Overview: Prior medication(s):  Zoloft 25 mg qd, discontinued after only several weeks of use in 2nd trimester  Assessment & Plan: Has since seen psychiatry, uncertain about current medications and dosing.  Plan as follows: - Maintain follow-up with psychiatry - Reviewed current medication  list, addressed compliance concerns - Follow-up peripherally  Orders: -     busPIRone HCl; Take 1 tablet (15 mg total) by mouth 3 (three) times daily.  Dispense: 90 tablet; Refill: 1 -     buPROPion HCl ER (XL); Take 1 tablet (150 mg total) by mouth every morning.  Dispense: 30 tablet; Refill: 1  Other orders -     Azithromycin; Take 2 tablets on day 1, then 1 tablet daily on days 2 through 5  Dispense: 6 tablet; Refill: 0     Orders & Medications Meds ordered this encounter  Medications   topiramate (TOPAMAX) 25 MG capsule    Sig: Take 1 capsule (25 mg total) by mouth 2 (two) times daily.    Dispense:  60 capsule    Refill:  1   azithromycin (ZITHROMAX) 250 MG tablet    Sig: Take 2 tablets on day 1, then 1 tablet daily on days 2 through 5    Dispense:  6 tablet    Refill:  0   metFORMIN (GLUCOPHAGE) 1000 MG tablet    Sig: Take 1 tablet (1,000 mg total) by mouth 2 (two) times daily with a meal.    Dispense:  60 tablet    Refill:  1   busPIRone (BUSPAR) 15 MG tablet    Sig: Take 1 tablet (15 mg total) by mouth 3 (three) times daily.    Dispense:  90 tablet    Refill:  1   buPROPion (WELLBUTRIN XL) 150 MG  24 hr tablet    Sig: Take 1 tablet (150 mg total) by mouth every morning.    Dispense:  30 tablet    Refill:  1   No orders of the defined types were placed in this encounter.    Return in about 4 weeks (around 07/06/2022) for CPE.     Montel Culver, MD, [facility]   Primary Care Sports Medicine Primary Care and Sports Medicine at [facility]

## 2022-06-13 NOTE — Assessment & Plan Note (Signed)
Stable weight, compliance issues with medications.  Plan as follows: - Reinforce medication compliance - Continue current regimen - Close follow-up

## 2022-06-13 NOTE — Assessment & Plan Note (Signed)
>>  ASSESSMENT AND PLAN FOR ENCOUNTER FOR WEIGHT MANAGEMENT WRITTEN ON 06/13/2022 12:54 AM BY Leeum Sankey J, MD  Stable weight, compliance issues with medications.  Plan as follows: - Reinforce medication compliance - Continue current regimen - Close follow-up

## 2022-06-13 NOTE — Assessment & Plan Note (Signed)
Several day history of worsening sinus pressure  Findings most consistent with pansinusitis, given recent urgent care visit and persistent symptoms, plan as follows: - Start azithromycin - Supportive care

## 2022-06-13 NOTE — Assessment & Plan Note (Signed)
Has since seen psychiatry, uncertain about current medications and dosing.  Plan as follows: - Maintain follow-up with psychiatry - Reviewed current medication list, addressed compliance concerns - Follow-up peripherally

## 2022-06-24 ENCOUNTER — Encounter: Payer: Self-pay | Admitting: Psychiatry

## 2022-06-24 ENCOUNTER — Ambulatory Visit: Payer: Medicaid Other | Admitting: Family Medicine

## 2022-06-24 ENCOUNTER — Ambulatory Visit (INDEPENDENT_AMBULATORY_CARE_PROVIDER_SITE_OTHER): Payer: Medicaid Other | Admitting: Psychiatry

## 2022-06-24 VITALS — BP 125/80 | HR 73 | Temp 97.9°F | Ht 67.0 in | Wt 190.4 lb

## 2022-06-24 DIAGNOSIS — F439 Reaction to severe stress, unspecified: Secondary | ICD-10-CM

## 2022-06-24 DIAGNOSIS — F411 Generalized anxiety disorder: Secondary | ICD-10-CM | POA: Diagnosis not present

## 2022-06-24 DIAGNOSIS — F32A Depression, unspecified: Secondary | ICD-10-CM | POA: Diagnosis not present

## 2022-06-24 NOTE — Patient Instructions (Signed)
  www.openpathcollective.org  www.psychologytoday  [facility] [email or web address].[email or web address] [address], [place], [place] [address]  ~20.2 mi [phone]  [facility] [email or web address] [address], [place], [place] [address]  ~20.9 mi [phone]   [facility] - [phone]  [facility] - [phone]  [facility] - [phone]   [facility] - [phone]  [facility] - [phone]   [email or web address]_today this clinician can offer telehealth and has a sliding scale option  [email or web address].[facility].org/ this group also offers sliding scale rates and is based out of [place]  Dr. Keisha Rogers with the [facility] specializes in divorce  [facility] has interns who offer sliding scale rates and some of the full time clinicians do, as well. You complete their contact form on their website and the referrals coordinator will help to get connected to someone   
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
## 2022-07-04 ENCOUNTER — Ambulatory Visit: Payer: Medicaid Other | Admitting: Dietician

## 2022-08-03 ENCOUNTER — Encounter: Payer: Self-pay | Admitting: Family Medicine

## 2022-08-03 ENCOUNTER — Ambulatory Visit (INDEPENDENT_AMBULATORY_CARE_PROVIDER_SITE_OTHER): Payer: Medicaid Other | Admitting: Family Medicine

## 2022-08-03 VITALS — BP 124/78 | HR 84 | Ht 67.0 in | Wt 185.0 lb

## 2022-08-03 DIAGNOSIS — F32A Depression, unspecified: Secondary | ICD-10-CM | POA: Diagnosis not present

## 2022-08-03 DIAGNOSIS — Z7689 Persons encountering health services in other specified circumstances: Secondary | ICD-10-CM | POA: Diagnosis not present

## 2022-08-03 DIAGNOSIS — F419 Anxiety disorder, unspecified: Secondary | ICD-10-CM

## 2022-08-03 DIAGNOSIS — Z6828 Body mass index (BMI) 28.0-28.9, adult: Secondary | ICD-10-CM | POA: Diagnosis not present

## 2022-08-03 NOTE — Progress Notes (Signed)
     Primary Care / Sports Medicine Office Visit  Patient Information:  Patient ID: Sandra Rivera, female DOB: [birth year] Age: 27 y.o. MRN: [id number]   Sandra Rivera is a pleasant 27 y.o. female presenting with the following:  Chief Complaint  Patient presents with   Weight Management Screening    Vitals:   08/03/22 1022  BP: 124/78  Pulse: 84  SpO2: 97%   Vitals:   08/03/22 1022  Weight: 185 lb (83.9 kg)  Height: 5\' 7"  (1.702 m)   Body mass index is 28.98 kg/m.  No results found.   Independent interpretation of notes and tests performed by another provider:   None  Procedures performed:   None  Pertinent History, Exam, Impression, and Recommendations:   [name] was seen today for weight management screening.  Encounter for weight management Assessment & Plan: Has had roughly 10 lbs weight loss since last visit in our office, at that time we spent a length of time focused on medication regimen compliance which patient has been working towards successfully. No adverse effects reported. Length of time discussing the adverse effect profile of phentermine and how she has been having significant weight loss without phentermine.  Plan as follows: - Continue current regimen - Referral to medical weight management for additional management options - Close follow-up  Orders: -     Amb Ref to Medical Weight Management  Anxiety and depression Assessment & Plan: Returns for follow-up, elevated PHQ and GAD scores relayed. Patient has been focused on maintaining compliance with medication regimen but reports dealing with increased stressors as of late "having to leave employment" and "increased childcare responsibilities". We spent a length of time discussing the utility and importance of maintaining compliance with medication regimen, providing feedback with providers, and addressing barriers to care. Plan as follows: - Patient will contact Dr. Elvera Maria office for next  steps - Hold from stimulant-based therapies so as not to exacerbate anxiety symptoms - Close follow-up with specific management by psychiatry    Adult BMI 28.0-28.9 kg/sq m -     Amb Ref to Medical Weight Management   I provided a total time of 47 minutes including both face-to-face and non-face-to-face time on 08/03/2022 inclusive of time utilized for medical chart review, information gathering, care coordination with staff, and documentation completion.   Orders & Medications No orders of the defined types were placed in this encounter.  Orders Placed This Encounter  Procedures   Amb Ref to Medical Weight Management     Return in about 2 months (around 10/03/2022) for CPE.     Sandra Banana, MD, [facility]   Primary Care Sports Medicine Primary Care and Sports Medicine at [facility]

## 2022-08-03 NOTE — Assessment & Plan Note (Signed)
Has had roughly 10 lbs weight loss since last visit in our office, at that time we spent a length of time focused on medication regimen compliance which patient has been working towards successfully. No adverse effects reported. Length of time discussing the adverse effect profile of phentermine and how she has been having significant weight loss without phentermine.  Plan as follows: - Continue current regimen - Referral to medical weight management for additional management options - Close follow-up

## 2022-08-03 NOTE — Assessment & Plan Note (Signed)
>>  ASSESSMENT AND PLAN FOR ENCOUNTER FOR WEIGHT MANAGEMENT WRITTEN ON 08/03/2022  2:42 PM BY Shanty Ginty J, MD  Has had roughly 10 lbs weight loss since last visit in our office, at that time we spent a length of time focused on medication regimen compliance which patient has been working towards successfully. No adverse effects reported. Length of time discussing the adverse effect profile of phentermine and how she has been having significant weight loss without phentermine.  Plan as follows: - Continue current regimen - Referral to medical weight management for additional management options - Close follow-up

## 2022-08-03 NOTE — Assessment & Plan Note (Signed)
Returns for follow-up, elevated PHQ and GAD scores relayed. Patient has been focused on maintaining compliance with medication regimen but reports dealing with increased stressors as of late "having to leave employment" and "increased childcare responsibilities". We spent a length of time discussing the utility and importance of maintaining compliance with medication regimen, providing feedback with providers, and addressing barriers to care. Plan as follows: - Patient will contact Dr. Elvera Maria office for next steps - Hold from stimulant-based therapies so as not to exacerbate anxiety symptoms - Close follow-up with specific management by psychiatry

## 2022-08-22 ENCOUNTER — Other Ambulatory Visit: Payer: Self-pay | Admitting: Family Medicine

## 2022-08-22 DIAGNOSIS — F419 Anxiety disorder, unspecified: Secondary | ICD-10-CM

## 2022-08-22 DIAGNOSIS — Z7689 Persons encountering health services in other specified circumstances: Secondary | ICD-10-CM

## 2022-08-23 ENCOUNTER — Other Ambulatory Visit: Payer: Self-pay | Admitting: Family Medicine

## 2022-08-23 DIAGNOSIS — Z7689 Persons encountering health services in other specified circumstances: Secondary | ICD-10-CM

## 2022-08-23 NOTE — Telephone Encounter (Signed)
Requested medications are due for refill today.  yes  Requested medications are on the active medications list.  yes  Last refill. 06/08/2022 #30 1 rf  Future visit scheduled.   yes  Notes to clinic.  Labs are expired.    Requested Prescriptions  Pending Prescriptions Disp Refills   buPROPion (WELLBUTRIN XL) 150 MG 24 hr tablet [Pharmacy Med Name: bupropion HCl XL 150 mg 24 hr tablet, extended release] 30 tablet 1    Sig: Take 1 tablet (150 mg total) by mouth every morning.     Psychiatry: Antidepressants - bupropion Failed - 08/22/2022 12:12 PM      Failed - Cr in normal range and within 360 days    Creatinine, Ser  Date Value Ref Range Status  09/03/2018 0.85 0.44 - 1.00 mg/dL Final   Creatinine, Urine  Date Value Ref Range Status  02/11/2015 146 mg/dL Final         Failed - AST in normal range and within 360 days    AST  Date Value Ref Range Status  09/03/2018 22 15 - 41 U/L Final         Failed - ALT in normal range and within 360 days    ALT  Date Value Ref Range Status  09/03/2018 20 0 - 44 U/L Final         Passed - Completed PHQ-2 or PHQ-9 in the last 360 days      Passed - Last BP in normal range    BP Readings from Last 1 Encounters:  08/03/22 124/78         Passed - Valid encounter within last 6 months    Recent Outpatient Visits           2 weeks ago Encounter for weight management   [place] [facility], Ocie Bob, MD   2 months ago Acute non-recurrent pansinusitis   [facility] at [facility], Ocie Bob, MD   4 months ago Encounter for weight management   [place] [facility], Ocie Bob, MD   5 months ago Anxiety and depression   [facility] Care & [facility] at [facility], Ocie Bob, MD       Future Appointments             In 1 month Ashley Royalty, Ocie Bob, MD [facility]  Primary Care & Sports Medicine at [facility], [facility]

## 2022-08-24 NOTE — Telephone Encounter (Signed)
Requested Prescriptions  Pending Prescriptions Disp Refills   metFORMIN (GLUCOPHAGE) 1000 MG tablet [Pharmacy Med Name: metformin 1,000 mg tablet] 180 tablet 0    Sig: Take 1 tablet (1,000 mg total) by mouth 2 (two) times daily with a meal.     Endocrinology:  Diabetes - Biguanides Failed - 08/23/2022  4:56 PM      Failed - Cr in normal range and within 360 days    Creatinine, Ser  Date Value Ref Range Status  09/03/2018 0.85 0.44 - 1.00 mg/dL Final   Creatinine, Urine  Date Value Ref Range Status  02/11/2015 146 mg/dL Final         Failed - HBA1C is between 0 and 7.9 and within 180 days    No results found for: "HGBA1C", "LABA1C"       Failed - eGFR in normal range and within 360 days    GFR calc Af Amer  Date Value Ref Range Status  09/03/2018 >60 >60 mL/min Final   GFR calc non Af Amer  Date Value Ref Range Status  09/03/2018 >60 >60 mL/min Final         Failed - B12 Level in normal range and within 720 days    No results found for: "VITAMINB12"       Failed - CBC within normal limits and completed in the last 12 months    WBC  Date Value Ref Range Status  03/31/2021 5.4 4.0 - 10.5 K/uL Final   RBC  Date Value Ref Range Status  03/31/2021 5.14 (H) 3.87 - 5.11 MIL/uL Final   Hemoglobin  Date Value Ref Range Status  03/31/2021 14.7 12.0 - 15.0 g/dL Final  16/12/9602 54.0 11.1 - 15.9 g/dL Final   HCT  Date Value Ref Range Status  03/31/2021 43.7 36.0 - 46.0 % Final   Hematocrit  Date Value Ref Range Status  08/06/2019 36.4 34.0 - 46.6 % Final   MCHC  Date Value Ref Range Status  03/31/2021 33.6 30.0 - 36.0 g/dL Final   [facility]  Date Value Ref Range Status  03/31/2021 28.6 26.0 - 34.0 pg Final   MCV  Date Value Ref Range Status  03/31/2021 85.0 80.0 - 100.0 fL Final  08/06/2019 85 79 - 97 fL Final   No results found for: "PLTCOUNTKUC", "LABPLAT", "POCPLA" RDW  Date Value Ref Range Status  03/31/2021 13.7 11.5 - 15.5 % Final  08/06/2019 12.8 11.7 -  15.4 % Final         Passed - Valid encounter within last 6 months    Recent Outpatient Visits           3 weeks ago Encounter for weight management   [place] [facility], Ocie Bob, MD   2 months ago Acute non-recurrent pansinusitis   [facility] at [facility], Ocie Bob, MD   4 months ago Encounter for weight management   [place] [facility] at [facility], Ocie Bob, MD   5 months ago Anxiety and depression   [facility] Primary Care & Sports Medicine at [facility], Ocie Bob, MD       Future Appointments             In 1 month Ashley Royalty Ocie Bob, MD [facility] Primary Care & Sports Medicine at [facility], [facility]

## 2022-09-19 ENCOUNTER — Other Ambulatory Visit: Payer: Self-pay | Admitting: Family Medicine

## 2022-09-19 DIAGNOSIS — Z7689 Persons encountering health services in other specified circumstances: Secondary | ICD-10-CM

## 2022-09-19 DIAGNOSIS — F32A Depression, unspecified: Secondary | ICD-10-CM

## 2022-09-19 NOTE — Telephone Encounter (Unsigned)
Copied from CRM [id number]. Topic: General - Other >> Sep 19, 2022  3:09 PM Everette C wrote: Reason for CRM: Medication Refill - Medication: buPROPion (WELLBUTRIN XL) 150 MG 24 hr tablet [[id number]]  busPIRone (BUSPAR) 15 MG tablet [[id number]]  butalbital-acetaminophen-caffeine (FIORICET) 50-325-40 MG tablet [[id number]]  metFORMIN (GLUCOPHAGE) 1000 MG tablet [[id number]]  topiramate (TOPAMAX) 25 MG capsule [[id number]]  traZODone (DESYREL) 50 MG tablet [[id number]]  Has the patient contacted their pharmacy? Yes.   (Agent: If no, request that the patient contact the pharmacy for the refill. If patient does not wish to contact the pharmacy document the reason why and proceed with request.) (Agent: If yes, when and what did the pharmacy advise?)  Preferred Pharmacy (with phone number or street name): [facility] [place], [place] - [address] [[id number]] Has the patient been seen for an appointment in the last year OR does the patient have an upcoming appointment? Yes.    Agent: Please be advised that RX refills may take up to 3 business days. We ask that you follow-up with your pharmacy.

## 2022-09-19 NOTE — Telephone Encounter (Signed)
[facility] called and spoke to [place], Pensions consultant about the refill(s) Metformin requested. Advised it was sent on 08/24/22 #180/0 refill(s). She says the only medication she told the patient was needed was buspirone. Will submit request for all others the patient requested due to no refills on file at the pharmacy.

## 2022-09-20 MED ORDER — BUSPIRONE HCL 15 MG PO TABS
15.0000 mg | ORAL_TABLET | Freq: Three times a day (TID) | ORAL | 1 refills | Status: AC
Start: 2022-09-20 — End: ?

## 2022-09-20 NOTE — Telephone Encounter (Signed)
Requested Prescriptions  Pending Prescriptions Disp Refills   butalbital-acetaminophen-caffeine (FIORICET) 50-325-40 MG tablet 14 tablet     Sig: Take 1 tablet by mouth 2 (two) times daily as needed.     Not Delegated - Analgesics:  Non-Opioid Analgesic Combinations 2 Failed - 09/19/2022  3:50 PM      Failed - This refill cannot be delegated      Failed - Cr in normal range and within 360 days    Creatinine, Ser  Date Value Ref Range Status  09/03/2018 0.85 0.44 - 1.00 mg/dL Final   Creatinine, Urine  Date Value Ref Range Status  02/11/2015 146 mg/dL Final         Failed - eGFR is 10 or above and within 360 days    GFR calc Af Amer  Date Value Ref Range Status  09/03/2018 >60 >60 mL/min Final   GFR calc non Af Amer  Date Value Ref Range Status  09/03/2018 >60 >60 mL/min Final         Passed - Patient is not pregnant      Passed - Valid encounter within last 12 months    Recent Outpatient Visits           1 month ago Encounter for weight management   [place] [facility] at [facility] Royalty, Ocie Bob, MD   3 months ago Acute non-recurrent pansinusitis   [facility] Primary Care & [facility] at [facility], Ocie Bob, MD   4 months ago Encounter for weight management   [place] [facility] at [facility], Ocie Bob, MD   6 months ago Anxiety and depression   [facility] Primary [facility] & [facility] at [facility] Royalty, Ocie Bob, MD       Future Appointments             In 2 weeks Ashley Royalty Ocie Bob, MD [facility] Primary Care & Sports Medicine at [facility], [facility]             topiramate (TOPAMAX) 25 MG capsule 60 capsule 1    Sig: Take 1 capsule (25 mg total) by mouth 2 (two) times daily.     Neurology: Anticonvulsants - topiramate & zonisamide Failed - 09/19/2022  3:50 PM      Failed - Cr in normal range and within 360 days    Creatinine, Ser  Date Value Ref Range  Status  09/03/2018 0.85 0.44 - 1.00 mg/dL Final   Creatinine, Urine  Date Value Ref Range Status  02/11/2015 146 mg/dL Final         Failed - CO2 in normal range and within 360 days    CO2  Date Value Ref Range Status  09/03/2018 22 22 - 32 mmol/L Final         Failed - ALT in normal range and within 360 days    ALT  Date Value Ref Range Status  09/03/2018 20 0 - 44 U/L Final         Failed - AST in normal range and within 360 days    AST  Date Value Ref Range Status  09/03/2018 22 15 - 41 U/L Final         Passed - Completed PHQ-2 or PHQ-9 in the last 360 days      Passed - Valid encounter within last 12 months    Recent Outpatient Visits  1 month ago Encounter for weight management   [place] [facility], Ocie Bob, MD   3 months ago Acute non-recurrent pansinusitis   [place] [facility] at [facility], Ocie Bob, MD   4 months ago Encounter for weight management   [place] [facility] at [facility], Ocie Bob, MD   6 months ago Anxiety and depression   [facility] at [facility], Ocie Bob, MD       Future Appointments             In 2 weeks Ashley Royalty Ocie Bob, MD [facility] Primary Care & Sports Medicine at [facility], [facility]             traZODone (DESYREL) 50 MG tablet 30 tablet 1    Sig: Take 0.5-1 tablets (25-50 mg total) by mouth at bedtime. For sleep     Psychiatry: Antidepressants - Serotonin Modulator Passed - 09/19/2022  3:50 PM      Passed - Completed PHQ-2 or PHQ-9 in the last 360 days      Passed - Valid encounter within last 6 months    Recent Outpatient Visits           1 month ago Encounter for weight management   [place] [facility] at [facility], Ocie Bob, MD   3 months ago Acute non-recurrent pansinusitis   [facility]  Primary Care & Sports Medicine at [facility], Ocie Bob, MD   4 months ago Encounter for weight management   [place] [facility] at [facility], Ocie Bob, MD   6 months ago Anxiety and depression   [facility] Primary Care & Sports Medicine at [facility], Ocie Bob, MD       Future Appointments             In 2 weeks Ashley Royalty Ocie Bob, MD [facility] Primary Care & Sports Medicine at [facility], [facility]             busPIRone (BUSPAR) 15 MG tablet 90 tablet 1    Sig: Take 1 tablet (15 mg total) by mouth 3 (three) times daily.     Psychiatry: Anxiolytics/Hypnotics - Non-controlled Passed - 09/19/2022  3:50 PM      Passed - Valid encounter within last 12 months    Recent Outpatient Visits           1 month ago Encounter for weight management   [place] [facility] at [facility], Ocie Bob, MD   3 months ago Acute non-recurrent pansinusitis   [facility] at [facility], Ocie Bob, MD   4 months ago Encounter for weight management   [facility] at [facility], Ocie Bob, MD   6 months ago Anxiety and depression   [facility] Sports Medicine at [facility], Ocie Bob, MD       Future Appointments             In 2 weeks Ashley Royalty, Ocie Bob, MD [facility] Primary Care & Sports Medicine at [facility], [facility]

## 2022-09-20 NOTE — Telephone Encounter (Signed)
Requested medication (s) are due for refill today -unsure  Requested medication (s) are on the active medication list -yes  Future visit scheduled -yes  Last refill: Fioricet- 06/24/22, listed as historical provider, non delegated Rx                 Topiramate-06/08/22, fails lab protocol- over 1 year- 09/03/18                  Trazodone- 05/03/22, outside provider  Notes to clinic: see above  Requested Prescriptions  Pending Prescriptions Disp Refills   butalbital-acetaminophen-caffeine (FIORICET) 50-325-40 MG tablet 14 tablet     Sig: Take 1 tablet by mouth 2 (two) times daily as needed.     Not Delegated - Analgesics:  Non-Opioid Analgesic Combinations 2 Failed - 09/19/2022  3:50 PM      Failed - This refill cannot be delegated      Failed - Cr in normal range and within 360 days    Creatinine, Ser  Date Value Ref Range Status  09/03/2018 0.85 0.44 - 1.00 mg/dL Final   Creatinine, Urine  Date Value Ref Range Status  02/11/2015 146 mg/dL Final         Failed - eGFR is 10 or above and within 360 days    GFR calc Af Amer  Date Value Ref Range Status  09/03/2018 >60 >60 mL/min Final   GFR calc non Af Amer  Date Value Ref Range Status  09/03/2018 >60 >60 mL/min Final         Passed - Patient is not pregnant      Passed - Valid encounter within last 12 months    Recent Outpatient Visits           1 month ago Encounter for weight management   [place] [facility], Ocie Bob, MD   3 months ago Acute non-recurrent pansinusitis   [facility] Primary Care & Sports Medicine at [facility], Ocie Bob, MD   4 months ago Encounter for weight management   [place] [facility] at [facility], Ocie Bob, MD   6 months ago Anxiety and depression   [facility] Primary Care & Sports Medicine at [facility] Royalty, Ocie Bob, MD       Future Appointments             In 2 weeks  Ashley Royalty Ocie Bob, MD [facility] Primary Care & Sports Medicine at [facility], [facility]             topiramate (TOPAMAX) 25 MG capsule 60 capsule 1    Sig: Take 1 capsule (25 mg total) by mouth 2 (two) times daily.     Neurology: Anticonvulsants - topiramate & zonisamide Failed - 09/19/2022  3:50 PM      Failed - Cr in normal range and within 360 days    Creatinine, Ser  Date Value Ref Range Status  09/03/2018 0.85 0.44 - 1.00 mg/dL Final   Creatinine, Urine  Date Value Ref Range Status  02/11/2015 146 mg/dL Final         Failed - CO2 in normal range and within 360 days    CO2  Date Value Ref Range Status  09/03/2018 22 22 - 32 mmol/L Final         Failed - ALT in normal range and within 360 days    ALT  Date Value Ref Range  Status  09/03/2018 20 0 - 44 U/L Final         Failed - AST in normal range and within 360 days    AST  Date Value Ref Range Status  09/03/2018 22 15 - 41 U/L Final         Passed - Completed PHQ-2 or PHQ-9 in the last 360 days      Passed - Valid encounter within last 12 months    Recent Outpatient Visits           1 month ago Encounter for weight management   [place] [facility], Ocie Bob, MD   3 months ago Acute non-recurrent pansinusitis   [facility] Primary Care & Sports Medicine at [facility], Ocie Bob, MD   4 months ago Encounter for weight management   [place] [facility] at [facility], Ocie Bob, MD   6 months ago Anxiety and depression   [place] [facility] at [facility], Ocie Bob, MD       Future Appointments             In 2 weeks Ashley Royalty Ocie Bob, MD [facility] Primary Care & Sports Medicine at [facility], [facility]             traZODone (DESYREL) 50 MG tablet 30 tablet 1    Sig: Take 0.5-1 tablets (25-50 mg total) by mouth at bedtime. For sleep     Psychiatry:  Antidepressants - Serotonin Modulator Passed - 09/19/2022  3:50 PM      Passed - Completed PHQ-2 or PHQ-9 in the last 360 days      Passed - Valid encounter within last 6 months    Recent Outpatient Visits           1 month ago Encounter for weight management   [place] [facility], Ocie Bob, MD   3 months ago Acute non-recurrent pansinusitis   [facility] Primary Care & Sports Medicine at [facility], Ocie Bob, MD   4 months ago Encounter for weight management   [place] [facility] at [facility], Ocie Bob, MD   6 months ago Anxiety and depression   [place] [facility] at [facility], Ocie Bob, MD       Future Appointments             In 2 weeks Ashley Royalty Ocie Bob, MD [facility] Primary Care & Sports Medicine at [facility], [facility]            Signed Prescriptions Disp Refills   busPIRone (BUSPAR) 15 MG tablet 90 tablet 1    Sig: Take 1 tablet (15 mg total) by mouth 3 (three) times daily.     Psychiatry: Anxiolytics/Hypnotics - Non-controlled Passed - 09/19/2022  3:50 PM      Passed - Valid encounter within last 12 months    Recent Outpatient Visits           1 month ago Encounter for weight management    [facility] Medicine at [facility], Ocie Bob, MD   3 months ago Acute non-recurrent pansinusitis   [facility] Primary Care & Sports Medicine at [facility], Ocie Bob, MD   4 months ago Encounter for weight management  [facility] Primary Care & Sports Medicine at [facility], Ocie Bob, MD   6 months ago Anxiety and depression   [facility] Primary Care & Sports Medicine at [facility], Ocie Bob, MD       Future Appointments             In 2 weeks Ashley Royalty, Ocie Bob, MD [facility] Primary Care & Sports Medicine at [facility], [facility]                Requested Prescriptions  Pending Prescriptions Disp Refills   butalbital-acetaminophen-caffeine (FIORICET) 50-325-40 MG tablet 14 tablet     Sig: Take 1 tablet by mouth 2 (two) times daily as needed.     Not Delegated - Analgesics:  Non-Opioid Analgesic Combinations 2 Failed - 09/19/2022  3:50 PM      Failed - This refill cannot be delegated      Failed - Cr in normal range and within 360 days    Creatinine, Ser  Date Value Ref Range Status  09/03/2018 0.85 0.44 - 1.00 mg/dL Final   Creatinine, Urine  Date Value Ref Range Status  02/11/2015 146 mg/dL Final         Failed - eGFR is 10 or above and within 360 days    GFR calc Af Amer  Date Value Ref Range Status  09/03/2018 >60 >60 mL/min Final   GFR calc non Af Amer  Date Value Ref Range Status  09/03/2018 >60 >60 mL/min Final         Passed - Patient is not pregnant      Passed - Valid encounter within last 12 months    Recent Outpatient Visits           1 month ago Encounter for weight management   [place] [facility], Ocie Bob, MD   3 months ago Acute non-recurrent pansinusitis   [facility] Primary Care & Sports Medicine at [facility], Ocie Bob, MD   4 months ago Encounter for weight management   [place] [facility] at [facility], Ocie Bob, MD   6 months ago Anxiety and depression   [facility] Primary Care & Sports Medicine at [facility] Royalty, Ocie Bob, MD       Future Appointments             In 2 weeks Ashley Royalty Ocie Bob, MD [facility] Primary Care & Sports Medicine at [facility], [facility]             topiramate (TOPAMAX) 25 MG capsule 60 capsule 1    Sig: Take 1 capsule (25 mg total) by mouth 2 (two) times daily.     Neurology: Anticonvulsants - topiramate & zonisamide Failed - 09/19/2022  3:50 PM      Failed - Cr in normal range and within 360 days    Creatinine, Ser  Date Value Ref  Range Status  09/03/2018 0.85 0.44 - 1.00 mg/dL Final   Creatinine, Urine  Date Value Ref Range Status  02/11/2015 146 mg/dL Final         Failed - CO2 in normal range and within 360 days    CO2  Date Value Ref Range Status  09/03/2018 22 22 - 32 mmol/L Final         Failed - ALT in normal range and within 360 days    ALT  Date Value  Ref Range Status  09/03/2018 20 0 - 44 U/L Final         Failed - AST in normal range and within 360 days    AST  Date Value Ref Range Status  09/03/2018 22 15 - 41 U/L Final         Passed - Completed PHQ-2 or PHQ-9 in the last 360 days      Passed - Valid encounter within last 12 months    Recent Outpatient Visits           1 month ago Encounter for weight management   [place] [facility] at [facility], Ocie Bob, MD   3 months ago Acute non-recurrent pansinusitis   [facility] Primary Care & Sports Medicine at [facility], Ocie Bob, MD   4 months ago Encounter for weight management   [place] [facility] at [facility], Ocie Bob, MD   6 months ago Anxiety and depression   [place] [facility] Medicine at [facility], Ocie Bob, MD       Future Appointments             In 2 weeks Ashley Royalty Ocie Bob, MD [facility] Primary Care & Sports Medicine at [facility], [facility]             traZODone (DESYREL) 50 MG tablet 30 tablet 1    Sig: Take 0.5-1 tablets (25-50 mg total) by mouth at bedtime. For sleep     Psychiatry: Antidepressants - Serotonin Modulator Passed - 09/19/2022  3:50 PM      Passed - Completed PHQ-2 or PHQ-9 in the last 360 days      Passed - Valid encounter within last 6 months    Recent Outpatient Visits           1 month ago Encounter for weight management   [place] [facility] at [facility], Ocie Bob, MD   3 months ago Acute non-recurrent pansinusitis   [facility]  Primary Care & Sports Medicine at [facility], Ocie Bob, MD   4 months ago Encounter for weight management   [place] [facility] Medicine at [facility], Ocie Bob, MD   6 months ago Anxiety and depression   [place] Primary [facility] Medicine at [facility], Ocie Bob, MD       Future Appointments             In 2 weeks Ashley Royalty Ocie Bob, MD [facility] Primary Care & Sports Medicine at [facility], [facility]            Signed Prescriptions Disp Refills   busPIRone (BUSPAR) 15 MG tablet 90 tablet 1    Sig: Take 1 tablet (15 mg total) by mouth 3 (three) times daily.     Psychiatry: Anxiolytics/Hypnotics - Non-controlled Passed - 09/19/2022  3:50 PM      Passed - Valid encounter within last 12 months    Recent Outpatient Visits           1 month ago Encounter for weight management    Primary Care & Sports Medicine at [facility], Ocie Bob, MD   3 months ago Acute non-recurrent pansinusitis   [facility] Primary [facility] Sports Medicine at [facility], Ocie Bob, MD   4 months ago Encounter for weight management  [facility] Primary Care & Sports Medicine at [facility], Ocie Bob, MD   6 months ago Anxiety and depression   [facility] Primary Care & Sports Medicine at [facility], Ocie Bob, MD       Future Appointments             In 2 weeks Ashley Royalty, Ocie Bob, MD [facility] Health Primary Care & Sports Medicine at [facility], [facility]

## 2022-09-20 NOTE — Telephone Encounter (Signed)
Please advise 

## 2022-09-21 MED ORDER — TOPIRAMATE 25 MG PO CPSP
25.0000 mg | ORAL_CAPSULE | Freq: Two times a day (BID) | ORAL | 1 refills | Status: DC
Start: 2022-09-21 — End: 2022-10-04

## 2022-09-21 MED ORDER — TRAZODONE HCL 50 MG PO TABS
25.0000 mg | ORAL_TABLET | Freq: Every day | ORAL | 1 refills | Status: DC
Start: 2022-09-21 — End: 2022-10-04

## 2022-09-23 ENCOUNTER — Encounter: Payer: Self-pay | Admitting: Psychiatry

## 2022-09-23 ENCOUNTER — Ambulatory Visit (INDEPENDENT_AMBULATORY_CARE_PROVIDER_SITE_OTHER): Payer: Medicaid Other | Admitting: Psychiatry

## 2022-09-23 VITALS — BP 91/58 | HR 81 | Temp 97.5°F | Ht 67.0 in | Wt 181.6 lb

## 2022-09-23 DIAGNOSIS — F411 Generalized anxiety disorder: Secondary | ICD-10-CM

## 2022-09-23 DIAGNOSIS — F330 Major depressive disorder, recurrent, mild: Secondary | ICD-10-CM

## 2022-09-23 DIAGNOSIS — F439 Reaction to severe stress, unspecified: Secondary | ICD-10-CM

## 2022-09-23 MED ORDER — HYDROXYZINE HCL 25 MG PO TABS
12.5000 mg | ORAL_TABLET | Freq: Two times a day (BID) | ORAL | 1 refills | Status: DC | PRN
Start: 2022-09-23 — End: 2022-12-06

## 2022-09-23 MED ORDER — BUPROPION HCL ER (XL) 300 MG PO TB24
300.0000 mg | ORAL_TABLET | Freq: Every morning | ORAL | 1 refills | Status: DC
Start: 2022-09-23 — End: 2022-11-28

## 2022-09-23 NOTE — Patient Instructions (Addendum)
Medicaid below :  [facility] [address] [place], [place] [address]  [phone]    [employer] [address] [place], [place] [address]  [phone]    [facility] [address] [place], [place] [address]  [phone]   Hydroxyzine Capsules or Tablets What is this medication? HYDROXYZINE (hye DROX i zeen) treats the symptoms of allergies and allergic reactions. It may also be used to treat anxiety or cause drowsiness before a procedure. It works by blocking histamine, a substance released by the body during an allergic reaction. It belongs to a group of medications called antihistamines. This medicine may be used for other purposes; ask your health care provider or pharmacist if you have questions. COMMON BRAND NAME(S): ANX, Atarax, Rezine, Vistaril What should I tell my care team before I take this medication? They need to know if you have any of these conditions: Glaucoma Heart disease Irregular heartbeat or rhythm Kidney disease Liver disease Lung or breathing disease, such as asthma Stomach or intestine problems Thyroid disease Trouble passing urine An unusual or allergic reaction to hydroxyzine, other medications, foods, dyes or preservatives Pregnant or trying to get pregnant Breastfeeding How should I use this medication? Take this medication by mouth with a full glass of water. Take it as directed on the prescription label at the same time every day. You can take it with or without food. If it upsets your stomach, take it with food. Talk to your care team about the use of this medication in children. While it may be prescribed for children as young as 6 years for selected conditions, precautions do apply. People 65 years and older may have a stronger reaction and need a smaller dose. Overdosage: If you think you have taken too much of this medicine contact a poison  control center or emergency room at once. NOTE: This medicine is only for you. Do not share this medicine with others. What if I miss a dose? If you miss a dose, take it as soon as you can. If it is almost time for your next dose, take only that dose. Do not take double or extra doses. What may interact with this medication? Do not take this medication with any of the following: Cisapride Dronedarone Pimozide Thioridazine This medication may also interact with the following: Alcohol Antihistamines for allergy, cough, and cold Atropine Barbiturate medications for sleep or seizures, such as phenobarbital Certain antibiotics, such as erythromycin or clarithromycin Certain medications for anxiety or sleep Certain medications for bladder problems, such as oxybutynin or tolterodine Certain medications for irregular heartbeat Certain medications for mental health conditions Certain medications for Parkinson disease, such as benztropine, trihexyphenidyl Certain medications for seizures, such as phenobarbital or primidone Certain medications for stomach problems, such as dicyclomine or hyoscyamine Certain medications for travel sickness, such as scopolamine Ipratropium Opioid medications for pain Other medications that cause heart rhythm changes, such as dofetilide This list may not describe all possible interactions. Give your health care provider a list of all the medicines, herbs, non-prescription drugs, or dietary supplements you use. Also tell them if you smoke, drink alcohol, or use illegal drugs. Some items may interact with your medicine. What should I watch for while using this medication? Visit your care team for regular checks on your progress. Tell your care team if your symptoms do not start to get better or if they get worse. This medication may affect your  coordination, reaction time, or judgment. Do not drive or operate machinery until you know how this medication affects you.  Sit up or stand slowly to reduce the risk of dizzy or fainting spells. Drinking alcohol with this medication can increase the risk of these side effects. Your mouth may get dry. Chewing sugarless gum or sucking hard candy and drinking plenty of water may help. Contact your care team if the problem does not go away or is severe. This medication may cause dry eyes and blurred vision. If you wear contact lenses, you may feel some discomfort. Lubricating eye drops may help. See your care team if the problem does not go away or is severe. If you are receiving skin tests for allergies, tell your care team you are taking this medication. What side effects may I notice from receiving this medication? Side effects that you should report to your care team as soon as possible: Allergic reactions--skin rash, itching, hives, swelling of the face, lips, tongue, or throat Heart rhythm changes--fast or irregular heartbeat, dizziness, feeling faint or lightheaded, chest pain, trouble breathing Side effects that usually do not require medical attention (report to your care team if they continue or are bothersome): Confusion Drowsiness Dry mouth Hallucinations Headache This list may not describe all possible side effects. Call your doctor for medical advice about side effects. You may report side effects to FDA at 1-800-FDA-1088. Where should I keep my medication? Keep out of the reach of children and pets. Store at room temperature between 15 and 30 degrees C (59 and 86 degrees F). Keep container tightly closed. Throw away any unused medication after the expiration date. NOTE: This sheet is a summary. It may not cover all possible information. If you have questions about this medicine, talk to your doctor, pharmacist, or health care provider.  2024 Elsevier/Gold Standard (2021-10-08 00:00:00)
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
## 2022-09-28 ENCOUNTER — Other Ambulatory Visit: Payer: Self-pay | Admitting: Student

## 2022-09-28 DIAGNOSIS — G43719 Chronic migraine without aura, intractable, without status migrainosus: Secondary | ICD-10-CM

## 2022-09-29 ENCOUNTER — Other Ambulatory Visit: Payer: Self-pay | Admitting: Family Medicine

## 2022-09-29 DIAGNOSIS — F32A Depression, unspecified: Secondary | ICD-10-CM

## 2022-09-29 DIAGNOSIS — Z7689 Persons encountering health services in other specified circumstances: Secondary | ICD-10-CM

## 2022-09-30 NOTE — Telephone Encounter (Signed)
Requested Prescriptions  Refused Prescriptions Disp Refills   buPROPion (WELLBUTRIN XL) 150 MG 24 hr tablet [Pharmacy Med Name: bupropion HCl XL 150 mg 24 hr tablet, extended release] 30 tablet 1    Sig: TAKE ONE TABLET BY MOUTH EVERY MORNING     Psychiatry: Antidepressants - bupropion Failed - 09/29/2022  8:00 AM      Failed - Cr in normal range and within 360 days    Creatinine, Ser  Date Value Ref Range Status  09/03/2018 0.85 0.44 - 1.00 mg/dL Final   Creatinine, Urine  Date Value Ref Range Status  02/11/2015 146 mg/dL Final         Failed - AST in normal range and within 360 days    AST  Date Value Ref Range Status  09/03/2018 22 15 - 41 U/L Final         Failed - ALT in normal range and within 360 days    ALT  Date Value Ref Range Status  09/03/2018 20 0 - 44 U/L Final         Passed - Completed PHQ-2 or PHQ-9 in the last 360 days      Passed - Last BP in normal range    BP Readings from Last 1 Encounters:  08/03/22 124/78         Passed - Valid encounter within last 6 months    Recent Outpatient Visits           1 month ago Encounter for weight management   [place] [facility] at [facility] Ashley Royalty, Ocie Bob, MD   3 months ago Acute non-recurrent pansinusitis   [facility], Ocie Bob, MD   5 months ago Encounter for weight management   [place] [facility], Ocie Bob, MD   6 months ago Anxiety and depression   [facility] at [facility], Ocie Bob, MD       Future Appointments             In 4 days Ashley Royalty, Ocie Bob, MD [facility] at [facility]

## 2022-10-04 ENCOUNTER — Encounter: Payer: Self-pay | Admitting: Family Medicine

## 2022-10-04 ENCOUNTER — Ambulatory Visit (INDEPENDENT_AMBULATORY_CARE_PROVIDER_SITE_OTHER): Payer: Medicaid Other | Admitting: Family Medicine

## 2022-10-04 VITALS — BP 122/78 | HR 80 | Ht 67.0 in | Wt 178.0 lb

## 2022-10-04 DIAGNOSIS — D649 Anemia, unspecified: Secondary | ICD-10-CM | POA: Diagnosis not present

## 2022-10-04 DIAGNOSIS — G43009 Migraine without aura, not intractable, without status migrainosus: Secondary | ICD-10-CM

## 2022-10-04 DIAGNOSIS — F419 Anxiety disorder, unspecified: Secondary | ICD-10-CM

## 2022-10-04 DIAGNOSIS — Z7689 Persons encountering health services in other specified circumstances: Secondary | ICD-10-CM

## 2022-10-04 DIAGNOSIS — K5909 Other constipation: Secondary | ICD-10-CM

## 2022-10-04 DIAGNOSIS — E559 Vitamin D deficiency, unspecified: Secondary | ICD-10-CM | POA: Diagnosis not present

## 2022-10-04 DIAGNOSIS — F32A Depression, unspecified: Secondary | ICD-10-CM

## 2022-10-04 DIAGNOSIS — Z131 Encounter for screening for diabetes mellitus: Secondary | ICD-10-CM

## 2022-10-04 DIAGNOSIS — Z13220 Encounter for screening for lipoid disorders: Secondary | ICD-10-CM | POA: Diagnosis not present

## 2022-10-04 DIAGNOSIS — Z0000 Encounter for general adult medical examination without abnormal findings: Secondary | ICD-10-CM | POA: Diagnosis not present

## 2022-10-04 MED ORDER — TRAZODONE HCL 50 MG PO TABS
25.0000 mg | ORAL_TABLET | Freq: Every day | ORAL | 1 refills | Status: DC
Start: 2022-10-04 — End: 2023-07-31

## 2022-10-04 MED ORDER — TOPIRAMATE 25 MG PO CPSP
25.0000 mg | ORAL_CAPSULE | Freq: Two times a day (BID) | ORAL | 2 refills | Status: DC
Start: 2022-10-04 — End: 2023-01-03

## 2022-10-04 MED ORDER — METFORMIN HCL 1000 MG PO TABS
1000.0000 mg | ORAL_TABLET | Freq: Two times a day (BID) | ORAL | 0 refills | Status: DC
Start: 2022-10-04 — End: 2023-01-03

## 2022-10-04 MED ORDER — LUBIPROSTONE 8 MCG PO CAPS: 8.0000 ug | ORAL_CAPSULE | Freq: Two times a day (BID) | ORAL | 2 refills | Status: DC

## 2022-10-04 NOTE — Assessment & Plan Note (Signed)
Has noted interim worsening of symptoms since being out of topiramate.  Topiramate to be utilized for both weight loss and migraine management.  Plan as follows: - Restart topiramate 25 mg daily x 1 week - After 1 week increase topiramate to 25 mg twice daily - Return for follow-up in 3 months

## 2022-10-04 NOTE — Assessment & Plan Note (Signed)
Chronic, noted on multiple prior labs in the setting of irregular menstrual cycle.  - Updated labs ordered - Advised patient to follow-up with GYN for menstrual cycle evaluation and management options
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
## 2022-10-04 NOTE — Assessment & Plan Note (Signed)
>>  ASSESSMENT AND PLAN FOR CHRONIC MIGRAINE WITH AURA WRITTEN ON 10/04/2022 12:03 PM BY Harce Volden J, MD  Has noted interim worsening of symptoms since being out of topiramate .  Topiramate  to be utilized for both weight loss and migraine management.  Plan as follows: - Restart topiramate  25 mg daily x 1 week - After 1 week increase topiramate  to 25 mg twice daily - Return for follow-up in 3 months

## 2022-10-04 NOTE — Assessment & Plan Note (Signed)
Follows with Dr. Elna Breslow, recent visit earlier this month.  - Resources were provided by Dr. Elna Breslow regarding CBT - Buspirone TID  - Wellbutrin XL was increased to 300 mg every day - Hydroxyzine 12.5-25 mg BID PRN

## 2022-10-04 NOTE — Patient Instructions (Addendum)
-   Obtain fasting labs with orders provided (can have water or black coffee but otherwise no food or drink x 8 hours before labs) - Review information provided - Attend eye doctor annually, dentist every 6 months, work towards or maintain 30 minutes of moderate intensity physical activity at least 5 days per week, and consume a balanced diet - Return in 3 months for follow-up - Contact us for any questions between now and then  Additionally: - Take medications as instructed by Dr. Elna Breslow - Review information attached and make lifestyle / dietary changes where applicable - Can start Amitiza (Lubiprostone) for constipation - take twice daily with food

## 2022-10-04 NOTE — Assessment & Plan Note (Signed)
Chronic, ongoing symptomatology despite lifestyle and dietary changes.  We reviewed treatment strategies given the recalcitrant nature of her symptoms, plan as follows: - Trial lubiprostone 8 mcg twice daily - Focus on adjunct lifestyle changes - Return in 3 months to assess response, can consider further titration as clinically guided

## 2022-10-05 LAB — COMPREHENSIVE METABOLIC PANEL
ALT: 12 IU/L (ref 0–32)
AST: 10 IU/L (ref 0–40)
Albumin: 4.6 g/dL (ref 4.0–5.0)
Alkaline Phosphatase: 64 IU/L (ref 44–121)
BUN/Creatinine Ratio: 11 (ref 9–23)
BUN: 9 mg/dL (ref 6–20)
Bilirubin Total: 0.2 mg/dL (ref 0.0–1.2)
CO2: 24 mmol/L (ref 20–29)
Calcium: 9.6 mg/dL (ref 8.7–10.2)
Chloride: 103 mmol/L (ref 96–106)
Creatinine, Ser: 0.79 mg/dL (ref 0.57–1.00)
Globulin, Total: 2.1 g/dL (ref 1.5–4.5)
Glucose: 85 mg/dL (ref 70–99)
Potassium: 4 mmol/L (ref 3.5–5.2)
Sodium: 140 mmol/L (ref 134–144)
Total Protein: 6.7 g/dL (ref 6.0–8.5)
eGFR: 105 mL/min/{1.73_m2} (ref >59)

## 2022-10-05 LAB — VITAMIN D 25 HYDROXY (VIT D DEFICIENCY, FRACTURES): Vit D, 25-Hydroxy: 36.5 ng/mL (ref 30.0–100.0)

## 2022-10-05 LAB — LIPID PANEL
Chol/HDL Ratio: 2.8 ratio (ref 0.0–4.4)
Cholesterol, Total: 198 mg/dL (ref 100–199)
HDL: 70 mg/dL (ref >39)
LDL Chol Calc (NIH): 117 mg/dL — ABNORMAL HIGH (ref 0–99)
Triglycerides: 57 mg/dL (ref 0–149)
VLDL Cholesterol Cal: 11 mg/dL (ref 5–40)

## 2022-10-05 LAB — TSH: TSH: 0.914 u[IU]/mL (ref 0.450–4.500)

## 2022-10-05 LAB — CBC
Hematocrit: 43.1 % (ref 34.0–46.6)
Hemoglobin: 13.9 g/dL (ref 11.1–15.9)
MCH: 26.7 pg (ref 26.6–33.0)
MCHC: 32.3 g/dL (ref 31.5–35.7)
MCV: 83 fL (ref 79–97)
Platelets: 255 10*3/uL (ref 150–450)
RBC: 5.21 x10E6/uL (ref 3.77–5.28)
RDW: 12.8 % (ref 11.7–15.4)
WBC: 4.5 10*3/uL (ref 3.4–10.8)

## 2022-10-05 LAB — HEMOGLOBIN A1C
Est. average glucose Bld gHb Est-mCnc: 103 mg/dL
Hgb A1c MFr Bld: 5.2 % (ref 4.8–5.6)

## 2022-10-05 LAB — IRON,TIBC AND FERRITIN PANEL
Ferritin: 13 ng/mL — ABNORMAL LOW (ref 15–150)
Iron Saturation: 35 % (ref 15–55)
Iron: 129 ug/dL (ref 27–159)
Total Iron Binding Capacity: 364 ug/dL (ref 250–450)
UIBC: 235 ug/dL (ref 131–425)

## 2022-10-12 ENCOUNTER — Encounter: Payer: Self-pay | Admitting: Student

## 2022-10-13 ENCOUNTER — Other Ambulatory Visit: Payer: Medicaid Other

## 2022-10-20 ENCOUNTER — Other Ambulatory Visit: Payer: Self-pay | Admitting: Student

## 2022-10-20 DIAGNOSIS — G43719 Chronic migraine without aura, intractable, without status migrainosus: Secondary | ICD-10-CM

## 2022-11-02 ENCOUNTER — Other Ambulatory Visit: Payer: Self-pay | Admitting: Psychiatry

## 2022-11-02 DIAGNOSIS — F411 Generalized anxiety disorder: Secondary | ICD-10-CM

## 2022-11-03 ENCOUNTER — Other Ambulatory Visit: Payer: Self-pay | Admitting: Family Medicine

## 2022-11-03 DIAGNOSIS — Z7689 Persons encountering health services in other specified circumstances: Secondary | ICD-10-CM

## 2022-11-03 DIAGNOSIS — F32A Depression, unspecified: Secondary | ICD-10-CM

## 2022-11-03 NOTE — Telephone Encounter (Signed)
Requested Prescriptions  Pending Prescriptions Disp Refills   busPIRone (BUSPAR) 15 MG tablet [Pharmacy Med Name: buspirone 15 mg tablet] 90 tablet 1    Sig: Take 1 tablet (15 mg total) by mouth 3 (three) times daily.     Psychiatry: Anxiolytics/Hypnotics - Non-controlled Passed - 11/03/2022  8:01 AM      Passed - Valid encounter within last 12 months    Recent Outpatient Visits           1 month ago Healthcare maintenance   [facility] Primary Care & Sports Medicine at [facility], Ocie Bob, MD   3 months ago Encounter for weight management   [place] Primary Care & Sports Medicine at [facility], Ocie Bob, MD   4 months ago Acute non-recurrent pansinusitis   [facility] Health Primary Care & Sports Medicine at [facility], Ocie Bob, MD   6 months ago Encounter for weight management   [place] Primary Care & Sports Medicine at [facility], Ocie Bob, MD   7 months ago Anxiety and depression   [facility] Primary Care & Sports Medicine at [facility], Ocie Bob, MD       Future Appointments             In 2 months Sandra Rivera Ocie Bob, MD [facility] Health Primary Care & Sports Medicine at [facility]             topiramate (TOPAMAX) 25 MG capsule [facility] Med Name: topiramate 25 mg sprinkle capsule] 60 capsule 1    Sig: Take 1 capsule (25 mg total) by mouth 2 (two) times daily.     Neurology: Anticonvulsants - topiramate & zonisamide Passed - 11/03/2022  8:01 AM      Passed - Cr in normal range and within 360 days    Creatinine, Ser  Date Value Ref Range Status  10/04/2022 0.79 0.57 - 1.00 mg/dL Final   Creatinine, Urine  Date Value Ref Range Status  02/11/2015 146 mg/dL Final         Passed - CO2 in normal range and within 360 days    CO2  Date Value Ref Range Status  10/04/2022 24 20 - 29 mmol/L Final         Passed - ALT in normal range and within 360 days    ALT  Date Value Ref Range Status   10/04/2022 12 0 - 32 IU/L Final         Passed - AST in normal range and within 360 days    AST  Date Value Ref Range Status  10/04/2022 10 0 - 40 IU/L Final         Passed - Completed PHQ-2 or PHQ-9 in the last 360 days      Passed - Valid encounter within last 12 months    Recent Outpatient Visits           1 month ago Healthcare maintenance   [facility] Primary Care & Sports Medicine at [facility] Sandra Rivera, Ocie Bob, MD   3 months ago Encounter for weight management   [place] Primary Care & Sports Medicine at [facility], Ocie Bob, MD   4 months ago Acute non-recurrent pansinusitis   [facility] Primary Care & Sports Medicine at [facility], Ocie Bob, MD   6 months ago Encounter for weight management   [facility] Primary Care & Sports Medicine at [facility], Ocie Bob,  MD   7 months ago Anxiety and depression   [place] Primary Care & Sports Medicine at [facility] Sandra Rivera, Ocie Bob, MD       Future Appointments             In 2 months Sandra Rivera, Ocie Bob, MD [facility] Health Primary Care & Sports Medicine at [facility]             traZODone (DESYREL) 50 MG tablet [Pharmacy Med Name: trazodone 50 mg tablet] 30 tablet 1    Sig: take ONE-HALF TO ONE tablet AT BEDTIME FOR SLEEP     Psychiatry: Antidepressants - Serotonin Modulator Passed - 11/03/2022  8:01 AM      Passed - Completed PHQ-2 or PHQ-9 in the last 360 days      Passed - Valid encounter within last 6 months    Recent Outpatient Visits           1 month ago Healthcare maintenance   [facility] Health Primary Care & Sports Medicine at [facility], Ocie Bob, MD   3 months ago Encounter for weight management   [place] Primary Care & Sports Medicine at [facility], Ocie Bob, MD   4 months ago Acute non-recurrent pansinusitis   [facility] Primary Care & Sports Medicine at [facility], Ocie Bob, MD   6 months  ago Encounter for weight management   [place] Primary Care & Sports Medicine at [facility], Ocie Bob, MD   7 months ago Anxiety and depression   [facility] Primary Care & Sports Medicine at [facility], Ocie Bob, MD       Future Appointments             In 2 months Sandra Rivera, Ocie Bob, MD [facility] Health Primary Care & Sports Medicine at [facility], [facility]

## 2022-11-10 ENCOUNTER — Ambulatory Visit: Payer: Medicaid Other | Admitting: Psychiatry

## 2022-11-25 ENCOUNTER — Other Ambulatory Visit: Payer: Self-pay | Admitting: Psychiatry

## 2022-11-25 DIAGNOSIS — F330 Major depressive disorder, recurrent, mild: Secondary | ICD-10-CM

## 2022-11-28 ENCOUNTER — Other Ambulatory Visit: Payer: Self-pay

## 2022-11-28 ENCOUNTER — Other Ambulatory Visit (HOSPITAL_COMMUNITY)
Admission: RE | Admit: 2022-11-28 | Discharge: 2022-11-28 | Disposition: A | Discharge status: Home / Self Care | Payer: Medicaid Other | Source: Ambulatory Visit

## 2022-11-28 ENCOUNTER — Ambulatory Visit: Payer: Medicaid Other

## 2022-11-28 VITALS — BP 109/78 | HR 76 | Wt 175.1 lb

## 2022-11-28 DIAGNOSIS — Z113 Encounter for screening for infections with a predominantly sexual mode of transmission: Secondary | ICD-10-CM

## 2022-11-28 DIAGNOSIS — Z124 Encounter for screening for malignant neoplasm of cervix: Secondary | ICD-10-CM | POA: Insufficient documentation

## 2022-11-28 DIAGNOSIS — R6882 Decreased libido: Secondary | ICD-10-CM

## 2022-11-28 DIAGNOSIS — N941 Unspecified dyspareunia: Secondary | ICD-10-CM | POA: Diagnosis not present
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
## 2022-11-29 LAB — HEP, RPR, HIV PANEL
HIV Screen 4th Generation wRfx: NONREACTIVE
Hepatitis B Surface Ag: NEGATIVE
RPR Ser Ql: NONREACTIVE

## 2022-12-01 ENCOUNTER — Other Ambulatory Visit: Payer: Self-pay | Admitting: Family Medicine

## 2022-12-02 LAB — CYTOLOGY - PAP
Chlamydia: NEGATIVE
Comment: NEGATIVE
Comment: NEGATIVE
Comment: NORMAL
Diagnosis: NEGATIVE
Neisseria Gonorrhea: NEGATIVE
Trichomonas: NEGATIVE

## 2022-12-05 ENCOUNTER — Other Ambulatory Visit: Payer: Self-pay | Admitting: Psychiatry

## 2022-12-05 DIAGNOSIS — F411 Generalized anxiety disorder: Secondary | ICD-10-CM

## 2022-12-05 NOTE — Telephone Encounter (Signed)
Requested Prescriptions  Pending Prescriptions Disp Refills   lubiprostone (AMITIZA) 8 MCG capsule [Pharmacy Med Name: LUBIPROSTONE 8 MCG CAP] 60 capsule 2    Sig: TAKE 1 CAPSULE BY MOUTH TWICE DAILY WITHA MEAL     Gastroenterology: Irritable Bowel Syndrome - lubiprostone Passed - 12/01/2022 12:56 PM      Passed - AST in normal range and within 360 days    AST  Date Value Ref Range Status  10/04/2022 10 0 - 40 IU/L Final         Passed - ALT in normal range and within 360 days    ALT  Date Value Ref Range Status  10/04/2022 12 0 - 32 IU/L Final         Passed - Valid encounter within last 12 months    Recent Outpatient Visits           2 months ago Healthcare maintenance   [facility] Primary Care & Sports Medicine at [facility], Ocie Bob, MD   4 months ago Encounter for weight management   [place] Primary Care & Sports Medicine at [facility], Ocie Bob, MD   6 months ago Acute non-recurrent pansinusitis   [facility] Primary Care & Sports Medicine at [facility], Ocie Bob, MD   7 months ago Encounter for weight management   [place] Primary Care & Sports Medicine at [facility], Ocie Bob, MD   8 months ago Anxiety and depression   [facility] Primary Care & Sports Medicine at [facility], Ocie Bob, MD       Future Appointments             In 1 month Ashley Royalty, Ocie Bob, MD [facility] Health Primary Care & Sports Medicine at [facility], [facility]

## 2022-12-14 ENCOUNTER — Encounter: Payer: Self-pay | Admitting: Psychiatry

## 2022-12-14 ENCOUNTER — Telehealth (INDEPENDENT_AMBULATORY_CARE_PROVIDER_SITE_OTHER): Payer: Medicaid Other | Admitting: Psychiatry

## 2022-12-14 DIAGNOSIS — F411 Generalized anxiety disorder: Secondary | ICD-10-CM

## 2022-12-14 DIAGNOSIS — F439 Reaction to severe stress, unspecified: Secondary | ICD-10-CM

## 2022-12-14 DIAGNOSIS — F330 Major depressive disorder, recurrent, mild: Secondary | ICD-10-CM | POA: Diagnosis not present

## 2022-12-14 MED ORDER — BUSPIRONE HCL 15 MG PO TABS: 15.0000 mg | ORAL_TABLET | Freq: Three times a day (TID) | ORAL | 1 refills | Status: DC

## 2022-12-14 MED ORDER — BUPROPION HCL ER (XL) 150 MG PO TB24: 150.0000 mg | ORAL_TABLET | Freq: Every morning | ORAL | 1 refills | Status: DC

## 2022-12-14 MED ORDER — BUPROPION HCL ER (XL) 300 MG PO TB24: 300.0000 mg | ORAL_TABLET | Freq: Every day | ORAL | 1 refills | Status: DC
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
## 2023-01-02 ENCOUNTER — Other Ambulatory Visit: Payer: Self-pay | Admitting: Family Medicine

## 2023-01-02 DIAGNOSIS — Z7689 Persons encountering health services in other specified circumstances: Secondary | ICD-10-CM

## 2023-01-03 NOTE — Telephone Encounter (Signed)
Requested Prescriptions  Pending Prescriptions Disp Refills   topiramate (TOPAMAX) 25 MG capsule [Pharmacy Med Name: topiramate 25 mg sprinkle capsule] 60 capsule 1    Sig: Take 1 capsule (25 mg total) by mouth 2 (two) times daily.     Neurology: Anticonvulsants - topiramate & zonisamide Passed - 01/02/2023  4:44 PM      Passed - Cr in normal range and within 360 days    Creatinine, Ser  Date Value Ref Range Status  10/04/2022 0.79 0.57 - 1.00 mg/dL Final   Creatinine, Urine  Date Value Ref Range Status  02/11/2015 146 mg/dL Final         Passed - CO2 in normal range and within 360 days    CO2  Date Value Ref Range Status  10/04/2022 24 20 - 29 mmol/L Final         Passed - ALT in normal range and within 360 days    ALT  Date Value Ref Range Status  10/04/2022 12 0 - 32 IU/L Final         Passed - AST in normal range and within 360 days    AST  Date Value Ref Range Status  10/04/2022 10 0 - 40 IU/L Final         Passed - Completed PHQ-2 or PHQ-9 in the last 360 days      Passed - Valid encounter within last 12 months    Recent Outpatient Visits           3 months ago Healthcare maintenance   [facility] at [facility] Ashley Royalty, Ocie Bob, MD   5 months ago Encounter for weight management   [place] [facility] Care & [facility] Medicine at [facility], Ocie Bob, MD   6 months ago Acute non-recurrent pansinusitis   [place] [facility] at [facility], Ocie Bob, MD   8 months ago Encounter for weight management   [place] Primary Care & Sports Medicine at [facility], Ocie Bob, MD   9 months ago Anxiety and depression   [place] [facility] & [facility] Medicine at [facility], Ocie Bob, MD       Future Appointments             Tomorrow Jerrol Banana, MD [facility] Primary Care & Sports Medicine at [facility], [facility]             metFORMIN  (GLUCOPHAGE) 1000 MG tablet [Pharmacy Med Name: metformin 1,000 mg tablet] 180 tablet 0    Sig: Take 1 tablet (1,000 mg total) by mouth 2 (two) times daily with a meal.     Endocrinology:  Diabetes - Biguanides Failed - 01/02/2023  4:44 PM      Failed - B12 Level in normal range and within 720 days    No results found for: "VITAMINB12"       Failed - CBC within normal limits and completed in the last 12 months    WBC  Date Value Ref Range Status  10/04/2022 4.5 3.4 - 10.8 x10E3/uL Final  03/31/2021 5.4 4.0 - 10.5 K/uL Final   RBC  Date Value Ref Range Status  10/04/2022 5.21 3.77 - 5.28 x10E6/uL Final  03/31/2021 5.14 (H) 3.87 - 5.11 MIL/uL Final   Hemoglobin  Date Value Ref Range Status  10/04/2022 13.9 11.1 - 15.9 g/dL Final   Hematocrit  Date Value Ref Range Status  10/04/2022 43.1 34.0 - 46.6 % Final   MCHC  Date Value Ref Range Status  10/04/2022 32.3 31.5 - 35.7 g/dL Final  09/81/1914 78.2 30.0 - 36.0 g/dL Final   [facility]  Date Value Ref Range Status  10/04/2022 26.7 26.6 - 33.0 pg Final  03/31/2021 28.6 26.0 - 34.0 pg Final   MCV  Date Value Ref Range Status  10/04/2022 83 79 - 97 fL Final   No results found for: "PLTCOUNTKUC", "LABPLAT", "POCPLA" RDW  Date Value Ref Range Status  10/04/2022 12.8 11.7 - 15.4 % Final         Passed - Cr in normal range and within 360 days    Creatinine, Ser  Date Value Ref Range Status  10/04/2022 0.79 0.57 - 1.00 mg/dL Final   Creatinine, Urine  Date Value Ref Range Status  02/11/2015 146 mg/dL Final         Passed - HBA1C is between 0 and 7.9 and within 180 days    Hgb A1c MFr Bld  Date Value Ref Range Status  10/04/2022 5.2 4.8 - 5.6 % Final    Comment:             Prediabetes: 5.7 - 6.4          Diabetes: >6.4          Glycemic control for adults with diabetes: <7.0          Passed - eGFR in normal range and within 360 days    GFR calc Af Amer  Date Value Ref Range Status  09/03/2018 >60 >60 mL/min Final    GFR calc non Af Amer  Date Value Ref Range Status  09/03/2018 >60 >60 mL/min Final   eGFR  Date Value Ref Range Status  10/04/2022 105 >59 mL/min/1.73 Final         Passed - Valid encounter within last 6 months    Recent Outpatient Visits           3 months ago Healthcare maintenance   [facility] at [facility], Ocie Bob, MD   5 months ago Encounter for weight management   [place] [facility] & [facility] at [facility], Ocie Bob, MD   6 months ago Acute non-recurrent pansinusitis   [facility] Care & [facility] Medicine at [facility], Ocie Bob, MD   8 months ago Encounter for weight management   [place] [facility] Care & [facility] Medicine at [facility], Ocie Bob, MD   9 months ago Anxiety and depression   [facility] Primary Care & [facility] at [facility], Ocie Bob, MD       Future Appointments             Tomorrow Ashley Royalty, Ocie Bob, MD [facility] Primary Care & Sports Medicine at [facility]

## 2023-01-04 ENCOUNTER — Ambulatory Visit: Payer: Medicaid Other | Admitting: Family Medicine

## 2023-01-04 ENCOUNTER — Encounter: Payer: Self-pay | Admitting: Family Medicine

## 2023-01-04 ENCOUNTER — Telehealth: Payer: Self-pay

## 2023-01-04 DIAGNOSIS — Z7689 Persons encountering health services in other specified circumstances: Secondary | ICD-10-CM | POA: Diagnosis not present

## 2023-01-04 MED ORDER — TOPIRAMATE 25 MG PO CPSP
25.0000 mg | ORAL_CAPSULE | Freq: Two times a day (BID) | ORAL | 0 refills | Status: DC
Start: 2023-01-04 — End: 2023-05-05

## 2023-01-04 MED ORDER — METFORMIN HCL 1000 MG PO TABS
1000.0000 mg | ORAL_TABLET | Freq: Two times a day (BID) | ORAL | 0 refills | Status: DC
Start: 2023-01-04 — End: 2023-07-31

## 2023-01-04 MED ORDER — SEMAGLUTIDE-WEIGHT MANAGEMENT 0.25 MG/0.5ML ~~LOC~~ SOAJ
0.2500 mg | SUBCUTANEOUS | 0 refills | Status: DC
Start: 2023-01-04 — End: 2023-05-01

## 2023-01-04 NOTE — Assessment & Plan Note (Signed)
>>  ASSESSMENT AND PLAN FOR ENCOUNTER FOR WEIGHT MANAGEMENT WRITTEN ON 01/04/2023 11:51 AM BY Carrick Rijos J, MD  Patient presents for follow-up to weight management, has demonstrated interim drop/stability in weight, has been aggressively working towards healthy lifestyle changes with increased physical activity, describes low appetite.  No medication tolerance issues reported.  Given her limited response to treatments thus far, contraindications to phentermine dosing, and recent Medicaid coverage changes, plan as follows:  - Prescription sent in for semaglutide  (Wegovy ) 0.25 mg weekly (first dose) - Once you are able to obtain the first dose, contact our office for us  to send prescription for Wegovy  0.5 mg weekly (second dose) - Once you have both the first and second doses, start the medication and contact our office to coordinate follow-up - Continue current medications until start of Wegovy

## 2023-01-04 NOTE — Telephone Encounter (Signed)
called pharmacy. they told me that patient picked up rx on 10-04 and can not refill until the 10-27. i asked how many pills patient was given and pharmacist states 30 pill. pharmacist was told that patient should have recieved 90 pills because rx was written for #90 because pt take 3 pill a day.    Pharmacist corrected issue and states that they will have the rx ready in about a hour.

## 2023-01-04 NOTE — Patient Instructions (Signed)
-   Prescription sent in for semaglutide [facility]) 0.25 mg weekly (first dose) - Once you are able to obtain the first dose, contact our office for [place] to send prescription for Wegovy 0.5 mg weekly (second dose) - Once you have both the first and second doses, start the medication and contact our office to coordinate follow-up - Continue current medications until start of [facility]

## 2023-01-04 NOTE — Telephone Encounter (Signed)
pt was notified what the issues was and that the pharmaist stated that she fixed the issue and that they are getting the rx ready for her and to check with pharmacy in about a hour.

## 2023-01-04 NOTE — Telephone Encounter (Signed)
pt called states that the pharmacy did not give her enough pill of the buspar and that when she went to the pharmacy they tell her she can not fill until 10-27.Marland Kitchen

## 2023-01-04 NOTE — Progress Notes (Signed)
Primary Care / Sports Medicine Office Visit  Patient Information:  Patient ID: Sandra Rivera, female DOB: 07-15-1995 Age: 27 y.o. MRN: [id number]   Sandra Rivera is a pleasant 27 y.o. female presenting with the following:  Chief Complaint  Patient presents with   Weight Check    Patient taking metformin and Topamax.     Vitals:   01/04/23 1002  BP: 122/74  Pulse: 83  SpO2: 98%   Vitals:   01/04/23 1002  Weight: 174 lb (78.9 kg)  Height: 5\' 7"  (1.702 m)   Body mass index is 27.25 kg/m.  No results found.   Independent interpretation of notes and tests performed by another provider:   None  Procedures performed:   None  Pertinent History, Exam, Impression, and Recommendations:   Problem List Items Addressed This Visit       Other   Encounter for weight management    Patient presents for follow-up to weight management, has demonstrated interim drop/stability in weight, has been aggressively working towards healthy lifestyle changes with increased physical activity, describes low appetite.  No medication tolerance issues reported.  Given her limited response to treatments thus far, contraindications to phentermine dosing, and recent Medicaid coverage changes, plan as follows:  - Prescription sent in for semaglutide [facility]) 0.25 mg weekly (first dose) - Once you are able to obtain the first dose, contact our office for [place] to send prescription for Wegovy 0.5 mg weekly (second dose) - Once you have both the first and second doses, start the medication and contact our office to coordinate follow-up - Continue current medications until start of Wegovy      Relevant Medications   metFORMIN (GLUCOPHAGE) 1000 MG tablet   topiramate (TOPAMAX) 25 MG capsule   Semaglutide-Weight Management 0.25 MG/0.5ML SOAJ     Orders & Medications Medications:  Meds ordered this encounter  Medications   metFORMIN (GLUCOPHAGE) 1000 MG tablet    Sig: Take 1 tablet (1,000 mg  total) by mouth 2 (two) times daily with a meal.    Dispense:  180 tablet    Refill:  0   topiramate (TOPAMAX) 25 MG capsule    Sig: Take 1 capsule (25 mg total) by mouth 2 (two) times daily.    Dispense:  180 capsule    Refill:  0   Semaglutide-Weight Management 0.25 MG/0.5ML SOAJ    Sig: Inject 0.25 mg into the skin once a week. Then increase to 0.5 mg once weekly    Dispense:  2 mL    Refill:  0   No orders of the defined types were placed in this encounter.    No follow-ups on file.     Jerrol Banana, MD, [facility]   Primary Care Sports Medicine Primary Care and Sports Medicine at [facility]

## 2023-01-04 NOTE — Assessment & Plan Note (Signed)
Patient presents for follow-up to weight management, has demonstrated interim drop/stability in weight, has been aggressively working towards healthy lifestyle changes with increased physical activity, describes low appetite.  No medication tolerance issues reported.  Given her limited response to treatments thus far, contraindications to phentermine dosing, and recent Medicaid coverage changes, plan as follows:  - Prescription sent in for semaglutide [facility]) 0.25 mg weekly (first dose) - Once you are able to obtain the first dose, contact our office for [place] to send prescription for Wegovy 0.5 mg weekly (second dose) - Once you have both the first and second doses, start the medication and contact our office to coordinate follow-up - Continue current medications until start of [facility]

## 2023-01-09 ENCOUNTER — Telehealth: Payer: Self-pay | Admitting: Family Medicine

## 2023-01-09 ENCOUNTER — Telehealth: Payer: Self-pay

## 2023-01-09 NOTE — Telephone Encounter (Signed)
Will complete PA.  KP

## 2023-01-09 NOTE — Telephone Encounter (Signed)
PA completed waiting on insurance approval.  Key: Sandra Rivera  KP

## 2023-01-09 NOTE — Telephone Encounter (Signed)
Copied from CRM [id number]. Topic: General - Inquiry >> Jan 09, 2023  4:14 PM Sandra Rivera wrote: Reason for CRM: Pt called saying the pharmacy told her that they need a PA done for the [facility] before medicaid will.

## 2023-01-10 ENCOUNTER — Encounter: Payer: Self-pay | Admitting: Family Medicine

## 2023-01-10 DIAGNOSIS — E785 Hyperlipidemia, unspecified: Secondary | ICD-10-CM | POA: Insufficient documentation

## 2023-01-10 NOTE — Telephone Encounter (Signed)
Please review.  KP

## 2023-01-10 NOTE — Telephone Encounter (Signed)
Denied.

## 2023-01-12 ENCOUNTER — Telehealth: Payer: Self-pay

## 2023-01-12 NOTE — Telephone Encounter (Signed)
PA completed waiting on insurance approval.  Key: [id number]  KP

## 2023-01-13 DIAGNOSIS — Z6827 Body mass index (BMI) 27.0-27.9, adult: Secondary | ICD-10-CM | POA: Insufficient documentation

## 2023-01-16 NOTE — Telephone Encounter (Signed)
Approved   01/13/23-07/12/23  KP

## 2023-02-01 ENCOUNTER — Telehealth: Payer: Self-pay

## 2023-02-01 DIAGNOSIS — F411 Generalized anxiety disorder: Secondary | ICD-10-CM

## 2023-02-01 NOTE — Telephone Encounter (Signed)
Received fax from pharmacy requesting a refill of the following medication please advise  hydrOXYzine (ATARAX) 25 MG tablet    Last visit 12/14/22 Next visit 02/03/23  Pharmacy [facility] - [place], [place] - [address] Phone: [phone]  Fax: [phone]

## 2023-02-02 MED ORDER — HYDROXYZINE HCL 25 MG PO TABS
25.0000 mg | ORAL_TABLET | Freq: Two times a day (BID) | ORAL | 1 refills | Status: DC | PRN
Start: 2023-02-02 — End: 2023-03-12

## 2023-02-02 NOTE — Addendum Note (Signed)
Addended byJomarie Longs on: 02/02/2023 12:58 PM   Modules accepted: Orders

## 2023-02-02 NOTE — Telephone Encounter (Signed)
I have sent hydroxyzine to pharmacy. 

## 2023-02-03 ENCOUNTER — Telehealth (INDEPENDENT_AMBULATORY_CARE_PROVIDER_SITE_OTHER): Payer: Medicaid Other | Admitting: Psychiatry

## 2023-02-03 ENCOUNTER — Encounter: Payer: Self-pay | Admitting: Psychiatry

## 2023-02-03 DIAGNOSIS — F439 Reaction to severe stress, unspecified: Secondary | ICD-10-CM | POA: Diagnosis not present

## 2023-02-03 DIAGNOSIS — F330 Major depressive disorder, recurrent, mild: Secondary | ICD-10-CM

## 2023-02-03 DIAGNOSIS — F411 Generalized anxiety disorder: Secondary | ICD-10-CM | POA: Diagnosis not present

## 2023-02-03 MED ORDER — BUPROPION HCL ER (XL) 300 MG PO TB24: 300.0000 mg | ORAL_TABLET | Freq: Every day | ORAL | 3 refills | Status: DC

## 2023-02-03 MED ORDER — BUSPIRONE HCL 15 MG PO TABS: 15.0000 mg | ORAL_TABLET | Freq: Three times a day (TID) | ORAL | 3 refills | Status: DC

## 2023-02-03 MED ORDER — BUPROPION HCL ER (XL) 150 MG PO TB24: 150.0000 mg | ORAL_TABLET | Freq: Every morning | ORAL | 3 refills | Status: DC
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
## 2023-02-13 NOTE — Telephone Encounter (Signed)
Please review.  KP

## 2023-02-20 NOTE — Telephone Encounter (Signed)
Okay to send in the 0.5 mg dose as well so she has both on hand. Send 1 month supply, thanks.

## 2023-02-21 ENCOUNTER — Telehealth: Payer: Self-pay

## 2023-02-21 ENCOUNTER — Other Ambulatory Visit: Payer: Self-pay

## 2023-02-21 MED ORDER — WEGOVY 0.5 MG/0.5ML ~~LOC~~ SOAJ: 0.5000 mg | SUBCUTANEOUS | 0 refills | Status: DC

## 2023-02-21 NOTE — Telephone Encounter (Signed)
Patient called requesting referral to [facility]

## 2023-02-24 ENCOUNTER — Other Ambulatory Visit: Payer: Self-pay | Admitting: Family Medicine

## 2023-02-24 NOTE — Telephone Encounter (Signed)
Requested Prescriptions  Pending Prescriptions Disp Refills   lubiprostone (AMITIZA) 8 MCG capsule [Pharmacy Med Name: LUBIPROSTONE 8 MCG CAP] 60 capsule 2    Sig: TAKE 1 CAPSULE BY MOUTH TWICE DAILY WITHA MEAL     Gastroenterology: Irritable Bowel Syndrome - lubiprostone Passed - 02/24/2023  3:02 PM      Passed - AST in normal range and within 360 days    AST  Date Value Ref Range Status  10/04/2022 10 0 - 40 IU/L Final         Passed - ALT in normal range and within 360 days    ALT  Date Value Ref Range Status  10/04/2022 12 0 - 32 IU/L Final         Passed - Valid encounter within last 12 months    Recent Outpatient Visits           1 month ago Encounter for weight management   [place], Ocie Bob, MD   4 months ago Healthcare maintenance   [facility] at [facility], Ocie Bob, MD   6 months ago Encounter for weight management   [place] [facility], Ocie Bob, MD   8 months ago Acute non-recurrent pansinusitis   [facility] at [facility], Ocie Bob, MD   10 months ago Encounter for weight management   [facility] Primary Care & Sports Medicine at [facility], Ocie Bob, MD

## 2023-03-01 ENCOUNTER — Other Ambulatory Visit: Payer: Self-pay

## 2023-03-01 DIAGNOSIS — N941 Unspecified dyspareunia: Secondary | ICD-10-CM

## 2023-03-04 ENCOUNTER — Other Ambulatory Visit: Payer: Self-pay | Admitting: Psychiatry

## 2023-03-04 DIAGNOSIS — F411 Generalized anxiety disorder: Secondary | ICD-10-CM

## 2023-04-12 ENCOUNTER — Ambulatory Visit (INDEPENDENT_AMBULATORY_CARE_PROVIDER_SITE_OTHER): Payer: Medicaid Other | Admitting: Psychiatry

## 2023-04-12 ENCOUNTER — Encounter: Payer: Self-pay | Admitting: Psychiatry

## 2023-04-12 VITALS — BP 118/78 | HR 82 | Temp 98.3°F | Ht 67.0 in | Wt 170.4 lb

## 2023-04-12 DIAGNOSIS — F330 Major depressive disorder, recurrent, mild: Secondary | ICD-10-CM

## 2023-04-12 DIAGNOSIS — F439 Reaction to severe stress, unspecified: Secondary | ICD-10-CM | POA: Diagnosis not present

## 2023-04-12 DIAGNOSIS — F411 Generalized anxiety disorder: Secondary | ICD-10-CM | POA: Diagnosis not present

## 2023-04-12 MED ORDER — BUSPIRONE HCL 30 MG PO TABS: 30.0000 mg | ORAL_TABLET | Freq: Two times a day (BID) | ORAL | 1 refills | Status: DC
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
## 2023-05-01 ENCOUNTER — Ambulatory Visit: Payer: Medicaid Other | Admitting: Family Medicine

## 2023-05-01 ENCOUNTER — Encounter: Payer: Self-pay | Admitting: Family Medicine

## 2023-05-01 VITALS — BP 122/72 | HR 99 | Ht 67.0 in | Wt 169.0 lb

## 2023-05-01 DIAGNOSIS — Z7689 Persons encountering health services in other specified circumstances: Secondary | ICD-10-CM

## 2023-05-01 DIAGNOSIS — K5909 Other constipation: Secondary | ICD-10-CM | POA: Diagnosis not present

## 2023-05-01 DIAGNOSIS — H60543 Acute eczematoid otitis externa, bilateral: Secondary | ICD-10-CM | POA: Diagnosis not present

## 2023-05-01 MED ORDER — WEGOVY 1 MG/0.5ML ~~LOC~~ SOAJ: 1.0000 mg | SUBCUTANEOUS | 2 refills | Status: DC

## 2023-05-01 MED ORDER — LUBIPROSTONE 8 MCG PO CAPS: 8.0000 ug | ORAL_CAPSULE | Freq: Two times a day (BID) | ORAL | 3 refills | Status: AC

## 2023-05-01 MED ORDER — HYDROCORTISONE 1 % EX OINT
1.0000 | TOPICAL_OINTMENT | Freq: Two times a day (BID) | CUTANEOUS | 1 refills | Status: DC
Start: 2023-05-01 — End: 2023-07-31

## 2023-05-01 NOTE — Assessment & Plan Note (Signed)
She experiences eczema in her ears, causing itching, especially at night. She lost her medication during a recent move and is experiencing increased symptoms.  Eczema in Ears Lost previous medication during recent move, currently experiencing itching. -Prescribe topical hydrocortisone 1% ointment for eczema management.

## 2023-05-01 NOTE — Assessment & Plan Note (Signed)
She has experienced a weight loss of five pounds since October, with her weight fluctuating between 166 and 174 pounds. She attributes this to her current low dose of Wegovy. She experiences nausea, which she believes is more related to her anxiety than the medication. She has started going to the gym every morning for about 40 minutes. Bowel movements occur every three days, an improvement from her previous pattern of over a week without a bowel movement. She uses Amitiza as needed, which she finds helpful.  Weight Management 5-pound weight loss since last visit. Currently on 0.5 mg dose weight loss medication with minimal side effects. Discussed potential for increased weight loss with higher dose but also increased risk of side effects. -Increase weight loss medication to 1mg  once weekly. -Consider further increase to 1.7mg  if tolerated and desired for additional weight loss. Contact office for that new Rx change. -See additional information regarding bowels.

## 2023-05-01 NOTE — Assessment & Plan Note (Signed)
>>  ASSESSMENT AND PLAN FOR ENCOUNTER FOR WEIGHT MANAGEMENT WRITTEN ON 05/01/2023  3:22 PM BY Elie Gragert J, MD  She has experienced a weight loss of five pounds since October, with her weight fluctuating between 166 and 174 pounds. She attributes this to her current low dose of Wegovy . She experiences nausea, which she believes is more related to her anxiety than the medication. She has started going to the gym every morning for about 40 minutes. Bowel movements occur every three days, an improvement from her previous pattern of over a week without a bowel movement. She uses Amitiza  as needed, which she finds helpful.  Weight Management 5-pound weight loss since last visit. Currently on 0.5 mg dose weight loss medication with minimal side effects. Discussed potential for increased weight loss with higher dose but also increased risk of side effects. -Increase weight loss medication to 1mg  once weekly. -Consider further increase to 1.7mg  if tolerated and desired for additional weight loss. Contact office for that new Rx change. -See additional information regarding bowels.

## 2023-05-01 NOTE — Progress Notes (Signed)
Primary Care / Sports Medicine Office Visit  Patient Information:  Patient ID: JALESHA PLOTZ, female DOB: 1995-08-30 Age: 28 y.o. MRN: [id number]   Sandra Rivera is a pleasant 28 y.o. female presenting with the following:  Chief Complaint  Patient presents with   Weight Check    Vitals:   05/01/23 1426  BP: 122/72  Pulse: 99  SpO2: 96%   Vitals:   05/01/23 1426  Weight: 169 lb (76.7 kg)  Height: 5\' 7"  (1.702 m)   Body mass index is 26.47 kg/m.  No results found.   Independent interpretation of notes and tests performed by another provider:   None  Procedures performed:   None  Pertinent History, Exam, Impression, and Recommendations:   Problem List Items Addressed This Visit     Chronic constipation   Bowel movements occur every three days, an improvement from her previous pattern of over a week without a bowel movement. She uses Amitiza as needed, which she finds helpful.  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 Currently using Amitiza as needed. -Continue Amitiza, use daily to promote regular bowel movements.      Eczema of external ear, bilateral - Primary   She experiences eczema in her ears, causing itching, especially at night. She lost her medication during a recent move and is experiencing increased symptoms.  Eczema in Ears Lost previous medication during recent move, currently experiencing itching. -Prescribe topical hydrocortisone 1% ointment for eczema management.      Relevant Medications   hydrocortisone 1 % ointment   Encounter for weight management   She has experienced a weight loss of five pounds since October, with her weight fluctuating between 166 and 174 pounds. She attributes this to her current low dose of Wegovy. She experiences nausea, which she believes is more related to her anxiety than the medication. She has started going to the gym every morning for about 40  minutes. Bowel movements occur every three days, an improvement from her previous pattern of over a week without a bowel movement. She uses Amitiza as needed, which she finds helpful.  Weight Management 5-pound weight loss since last visit. Currently on 0.5 mg dose weight loss medication with minimal side effects. Discussed potential for increased weight loss with higher dose but also increased risk of side effects. -Increase weight loss medication to 1mg  once weekly. -Consider further increase to 1.7mg  if tolerated and desired for additional weight loss. Contact office for that new Rx change. -See additional information regarding bowels.         Orders & Medications Medications:  Meds ordered this encounter  Medications   WEGOVY 1 MG/0.5ML SOAJ    Sig: Inject 1 mg into the skin once a week. Use this dose for 1 month (4 shots) and then increase to next higher dose.    Dispense:  2 mL    Refill:  2   lubiprostone (AMITIZA) 8 MCG capsule    Sig: Take 1 capsule (8 mcg total) by mouth 2 (two) times daily with a meal.    Dispense:  180 capsule    Refill:  3   hydrocortisone 1 % ointment    Sig: Apply 1 Application topically 2 (two) times daily. Apply a thin layer to the affected areas of both ears twice daily. Use sparingly and avoid contact with the ear canal. Discontinue use if irritation occurs.    Dispense:  28 g    Refill:  1   No orders of the defined types were placed in this encounter.    Return in about 3 months (around 07/29/2023) for Weight MGMT f/u.     Jerrol Banana, MD, [facility]   Primary Care Sports Medicine Primary Care and Sports Medicine at [facility]

## 2023-05-01 NOTE — Assessment & Plan Note (Signed)
Bowel movements occur every three days, an improvement from her previous pattern of over a week without a bowel movement. She uses Amitiza as needed, which she finds helpful.  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 Currently using Amitiza as needed. -Continue Amitiza, use daily to promote regular bowel movements.

## 2023-05-01 NOTE — Patient Instructions (Signed)
Plan  Constipation / IBS-C - Continue Amitiza, use daily to promote regular bowel movements.  Eczema in Ears - Prescribe topical hydrocortisone 1% ointment for eczema management.  Weight Management - Increase weight loss medication to 1 mg once weekly. - Consider further increase to 1.7 mg if tolerated and desired for additional weight loss; contact office for prescription change.

## 2023-05-04 ENCOUNTER — Other Ambulatory Visit: Payer: Self-pay | Admitting: Psychiatry

## 2023-05-04 DIAGNOSIS — F330 Major depressive disorder, recurrent, mild: Secondary | ICD-10-CM

## 2023-05-05 ENCOUNTER — Telehealth: Payer: Medicaid Other | Admitting: Psychiatry

## 2023-05-05 ENCOUNTER — Encounter: Payer: Self-pay | Admitting: Psychiatry

## 2023-05-05 DIAGNOSIS — F330 Major depressive disorder, recurrent, mild: Secondary | ICD-10-CM | POA: Diagnosis not present

## 2023-05-05 DIAGNOSIS — F439 Reaction to severe stress, unspecified: Secondary | ICD-10-CM

## 2023-05-05 DIAGNOSIS — F411 Generalized anxiety disorder: Secondary | ICD-10-CM | POA: Diagnosis not present
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
## 2023-05-10 ENCOUNTER — Ambulatory Visit (INDEPENDENT_AMBULATORY_CARE_PROVIDER_SITE_OTHER): Payer: Medicaid Other | Admitting: Licensed Clinical Social Worker

## 2023-05-10 DIAGNOSIS — F411 Generalized anxiety disorder: Secondary | ICD-10-CM | POA: Diagnosis not present

## 2023-05-10 DIAGNOSIS — F439 Reaction to severe stress, unspecified: Secondary | ICD-10-CM

## 2023-05-10 DIAGNOSIS — F330 Major depressive disorder, recurrent, mild: Secondary | ICD-10-CM | POA: Diagnosis not present
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
## 2023-05-12 ENCOUNTER — Other Ambulatory Visit: Payer: Self-pay | Admitting: Psychiatry

## 2023-05-12 DIAGNOSIS — F330 Major depressive disorder, recurrent, mild: Secondary | ICD-10-CM

## 2023-05-12 DIAGNOSIS — F411 Generalized anxiety disorder: Secondary | ICD-10-CM

## 2023-06-02 ENCOUNTER — Telehealth: Payer: Self-pay

## 2023-06-02 DIAGNOSIS — F411 Generalized anxiety disorder: Secondary | ICD-10-CM

## 2023-06-02 MED ORDER — HYDROXYZINE HCL 25 MG PO TABS: 25.0000 mg | ORAL_TABLET | Freq: Two times a day (BID) | ORAL | 2 refills | Status: DC | PRN

## 2023-06-02 NOTE — Telephone Encounter (Signed)
I have sent hydroxyzine to pharmacy. 

## 2023-06-02 NOTE — Telephone Encounter (Signed)
 received fax requesting a refill on the hydroxyzine. pt has a appt on 4-11 last seen 2-21

## 2023-06-05 NOTE — Telephone Encounter (Signed)
 Pt.notified

## 2023-06-06 ENCOUNTER — Ambulatory Visit

## 2023-06-06 ENCOUNTER — Other Ambulatory Visit: Payer: Self-pay | Admitting: Family Medicine

## 2023-06-06 DIAGNOSIS — Z111 Encounter for screening for respiratory tuberculosis: Secondary | ICD-10-CM

## 2023-06-11 LAB — QUANTIFERON-TB GOLD PLUS
QuantiFERON Mitogen Value: >10 [IU]/mL
QuantiFERON Nil Value: 0.02 [IU]/mL
QuantiFERON TB1 Ag Value: 0.04 [IU]/mL
QuantiFERON TB2 Ag Value: 0.04 [IU]/mL
QuantiFERON-TB Gold Plus: NEGATIVE

## 2023-06-12 ENCOUNTER — Telehealth: Payer: Self-pay

## 2023-06-12 ENCOUNTER — Encounter: Payer: Self-pay | Admitting: Family Medicine

## 2023-06-12 MED ORDER — BUSPIRONE HCL 30 MG PO TABS: 30.0000 mg | ORAL_TABLET | Freq: Two times a day (BID) | ORAL | 0 refills | Status: DC

## 2023-06-12 NOTE — Telephone Encounter (Signed)
 received fax requesting a refill on the buspirone hcl 30mg  take 1 tablet by mouth twice daily at 8:00 and at 5:00  Pt was last seen on 2-21 next appt 4-11

## 2023-06-13 NOTE — Telephone Encounter (Signed)
 left message that rx had been sent to the pharmacy

## 2023-06-22 ENCOUNTER — Ambulatory Visit (INDEPENDENT_AMBULATORY_CARE_PROVIDER_SITE_OTHER): Payer: Medicaid Other | Admitting: Licensed Clinical Social Worker

## 2023-06-22 DIAGNOSIS — F439 Reaction to severe stress, unspecified: Secondary | ICD-10-CM | POA: Diagnosis not present

## 2023-06-22 DIAGNOSIS — F411 Generalized anxiety disorder: Secondary | ICD-10-CM | POA: Diagnosis not present

## 2023-06-22 DIAGNOSIS — F330 Major depressive disorder, recurrent, mild: Secondary | ICD-10-CM
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
## 2023-06-23 ENCOUNTER — Telehealth: Payer: Medicaid Other | Admitting: Psychiatry

## 2023-06-23 ENCOUNTER — Encounter: Payer: Self-pay | Admitting: Psychiatry

## 2023-06-23 DIAGNOSIS — F439 Reaction to severe stress, unspecified: Secondary | ICD-10-CM

## 2023-06-23 DIAGNOSIS — F411 Generalized anxiety disorder: Secondary | ICD-10-CM | POA: Diagnosis not present

## 2023-06-23 DIAGNOSIS — F330 Major depressive disorder, recurrent, mild: Secondary | ICD-10-CM

## 2023-06-23 MED ORDER — BUPROPION HCL ER (XL) 150 MG PO TB24: 150.0000 mg | ORAL_TABLET | Freq: Every morning | ORAL | 3 refills | Status: DC

## 2023-06-23 MED ORDER — HYDROXYZINE HCL 25 MG PO TABS: 25.0000 mg | ORAL_TABLET | Freq: Two times a day (BID) | ORAL | 2 refills | Status: DC | PRN

## 2023-06-23 MED ORDER — FLUOXETINE HCL 20 MG PO CAPS: 20.0000 mg | ORAL_CAPSULE | Freq: Every day | ORAL | 1 refills | Status: DC

## 2023-06-23 MED ORDER — BUSPIRONE HCL 15 MG PO TABS: 15.0000 mg | ORAL_TABLET | Freq: Two times a day (BID) | ORAL | 2 refills | Status: DC

## 2023-06-23 MED ORDER — BUPROPION HCL ER (XL) 300 MG PO TB24: 300.0000 mg | ORAL_TABLET | Freq: Every day | ORAL | 3 refills | Status: DC
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
## 2023-06-23 NOTE — Patient Instructions (Signed)
Fluoxetine Capsules or Tablets (PMDD) What is this medication? FLUOXETINE (floo OX e teen) treats premenstrual dysphoric disorder (PMDD). It increases the amount of serotonin in the brain, a hormone that helps regulate mood. It belongs to a group of medications called SSRIs. This medicine may be used for other purposes; ask your health care provider or pharmacist if you have questions. COMMON BRAND NAME(S): Prozac, Sarafem, Selfemra What should I tell my care team before I take this medication? They need to know if you have any of these conditions: Bipolar disorder or a family history of bipolar disorder Bleeding disorder Glaucoma Heart disease Liver disease Low levels of sodium in the blood Seizures Suicidal thoughts, plans, or attempt by you or a family member Take an MAOI, such as Carbex, Eldepryl, Marplan, Nardil, or Parnate Take medications that treat or prevent blood clots Thyroid disease An unusual or allergic reaction to fluoxetine, other medications, foods, dyes, or preservatives Pregnant or trying to get pregnant Breastfeeding How should I use this medication? Take this medication by mouth with a glass of water. Follow the directions on the prescription label. You can take it with or without food. Take your medication at regular intervals. Do not take it more often than directed. Do not stop taking this medication suddenly except upon the advice of your care team. Stopping this medication too quickly may cause serious side effects or your condition may worsen. A special MedGuide will be given to you by the pharmacist with each prescription and refill. Be sure to read this information carefully each time. Talk to your care team about the use of this medication in children. Special care may be needed. Overdosage: If you think you have taken too much of this medicine contact a poison control center or emergency room at once. NOTE: This medicine is only for you. Do not share this  medicine with others. What if I miss a dose? If you miss a dose, skip the missed dose and go back to your regular dosing schedule. Do not take double or extra doses. What may interact with this medication? Do not take this medication with any of the following: Other medications containing fluoxetine, such as Prozac or Symbyax Cisapride Dronedarone Linezolid MAOIs, such as Carbex, Eldepryl, Marplan, Nardil, and Parnate Methylene blue (injected into a vein) Pimozide Thioridazine This medication may also interact with the following: Alcohol Amphetamines Aspirin and aspirin-like medications Carbamazepine Certain medications for mental health conditions Certain medications for migraine headache, such as almotriptan, eletriptan, frovatriptan, naratriptan, rizatriptan, sumatriptan, zolmitriptan Digoxin Diuretics Fentanyl Flecainide Furazolidone Isoniazid Lithium Medications that help you fall asleep Medications that treat or prevent blood clots, such as warfarin, enoxaparin, and dalteparin NSAIDs, medications for pain and inflammation, such as ibuprofen or naproxen Other medications that cause heart rhythm changes Phenytoin Procarbazine Propafenone Rasagiline Ritonavir Supplements, such as St. John's wort, kava kava, valerian Tramadol Tryptophan Vinblastine This list may not describe all possible interactions. Give your health care provider a list of all the medicines, herbs, non-prescription drugs, or dietary supplements you use. Also tell them if you smoke, drink alcohol, or use illegal drugs. Some items may interact with your medicine. What should I watch for while using this medication? Tell your care team if your symptoms do not get better or if they get worse. Visit your care team for regular checks on your progress. Because it may take several weeks to see the full effects of this medication, it is important to continue your treatment as prescribed. Watch for new  or  worsening thoughts of suicide or depression. This includes sudden changes in mood, behavior, or thoughts. These changes can happen at any time but are more common in the beginning of treatment or after a change in dose. Call your care team right away if you experience these thoughts or worsening depression. This medication may cause mood and behavior changes, such as anxiety, nervousness, irritability, hostility, restlessness, excitability, hyperactivity, or trouble sleeping. These changes can happen at any time but are more common in the beginning of treatment or after a change in dose. Call your care team right away if you notice any of these symptoms. This medication may affect your coordination, reaction time, or judgment. Do not drive or operate machinery until you know how this medication affects you. Sit up or stand slowly to reduce the risk of dizzy or fainting spells. Drinking alcohol with this medication can increase the risk of these side effects. Your mouth may get dry. Chewing sugarless gum or sucking hard candy and drinking plenty of water may help. Contact your care team if the problem does not go away or is severe. This medication may affect blood sugar levels. If you have diabetes, check with your care team before you make changes to your diet or medications. What side effects may I notice from receiving this medication? Side effects that you should report to your care team as soon as possible: Allergic reactions--skin rash, itching, hives, swelling of the face, lips, tongue, or throat Bleeding--bloody or black, tar-like stools, red or dark brown urine, vomiting blood or brown material that looks like coffee grounds, small, red or purple spots on skin, unusual bleeding or bruising Heart rhythm changes--fast or irregular heartbeat, dizziness, feeling faint or lightheaded, chest pain, trouble breathing Loss of appetite with weight loss Low sodium level--muscle weakness, fatigue, dizziness,  headache, confusion Serotonin syndrome--irritability, confusion, fast or irregular heartbeat, muscle stiffness, twitching muscles, sweating, high fever, seizure, chills, vomiting, diarrhea Sudden eye pain or change in vision such as blurry vision, seeing halos around lights, vision loss Thoughts of suicide or self-harm, worsening mood, feelings of depression Side effects that usually do not require medical attention (report to your care team if they continue or are bothersome): Anxiety, nervousness Change in sex drive or performance Diarrhea Dry mouth Headache Excessive sweating Nausea Tremors or shaking Trouble sleeping Upset stomach This list may not describe all possible side effects. Call your doctor for medical advice about side effects. You may report side effects to FDA at 1-800-FDA-1088. Where should I keep my medication? Keep out of the reach of children and pets. Store at room temperature between 15 and 30 degrees C (59 and 86 degrees F). Get rid of any unused medication after the expiration date. NOTE: This sheet is a summary. It may not cover all possible information. If you have questions about this medicine, talk to your doctor, pharmacist, or health care provider.  2024 Elsevier/Gold Standard (2021-12-14 00:00:00)

## 2023-06-26 ENCOUNTER — Other Ambulatory Visit: Payer: Self-pay | Admitting: Family Medicine

## 2023-06-27 NOTE — Telephone Encounter (Signed)
 Requested Prescriptions  Pending Prescriptions Disp Refills   WEGOVY 1 MG/0.5ML SOAJ [Pharmacy Med Name: WEGOVY 1 MG/0.5ML SUBQ SOLN ML] 2 mL 0    Sig: INJECT 1MG  SUBCUTANEOUSLY ONCE A WEEK. USE THIS DOSE FOR 1 MONTH     Endocrinology:  Diabetes - GLP-1 Receptor Agonists - semaglutide Failed - 06/27/2023 12:02 PM      Failed - HBA1C in normal range and within 180 days    Hgb A1c MFr Bld  Date Value Ref Range Status  10/04/2022 5.2 4.8 - 5.6 % Final    Comment:             Prediabetes: 5.7 - 6.4          Diabetes: >6.4          Glycemic control for adults with diabetes: <7.0          Passed - Cr in normal range and within 360 days    Creatinine, Ser  Date Value Ref Range Status  10/04/2022 0.79 0.57 - 1.00 mg/dL Final   Creatinine, Urine  Date Value Ref Range Status  02/11/2015 146 mg/dL Final         Passed - Valid encounter within last 6 months    Recent Outpatient Visits           1 month ago Eczema of external ear, bilateral   [place] [facility] at [facility], Dessie Flow, MD       Future Appointments             In 1 month Augustus Ledger, Dessie Flow, MD [facility] Primary Care & Sports Medicine at [facility], [facility]

## 2023-07-03 ENCOUNTER — Ambulatory Visit (INDEPENDENT_AMBULATORY_CARE_PROVIDER_SITE_OTHER): Payer: Medicaid Other | Admitting: Licensed Clinical Social Worker

## 2023-07-03 DIAGNOSIS — Z91199 Patient's noncompliance with other medical treatment and regimen due to unspecified reason: Secondary | ICD-10-CM

## 2023-07-03 NOTE — Progress Notes (Signed)
 Clinician attempted session via face-to-face, but Sandra Rivera did not appear for her session.

## 2023-07-17 ENCOUNTER — Ambulatory Visit: Payer: Medicaid Other | Admitting: Licensed Clinical Social Worker

## 2023-07-18 ENCOUNTER — Ambulatory Visit: Admitting: Licensed Clinical Social Worker

## 2023-07-21 ENCOUNTER — Encounter: Payer: Self-pay | Admitting: Psychiatry

## 2023-07-21 ENCOUNTER — Telehealth (INDEPENDENT_AMBULATORY_CARE_PROVIDER_SITE_OTHER): Admitting: Psychiatry

## 2023-07-21 DIAGNOSIS — F439 Reaction to severe stress, unspecified: Secondary | ICD-10-CM | POA: Diagnosis not present

## 2023-07-21 DIAGNOSIS — F411 Generalized anxiety disorder: Secondary | ICD-10-CM

## 2023-07-21 DIAGNOSIS — F330 Major depressive disorder, recurrent, mild: Secondary | ICD-10-CM
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
## 2023-07-25 ENCOUNTER — Encounter: Payer: Self-pay | Admitting: Licensed Clinical Social Worker

## 2023-07-31 ENCOUNTER — Encounter: Payer: Self-pay | Admitting: Family Medicine

## 2023-07-31 ENCOUNTER — Ambulatory Visit (INDEPENDENT_AMBULATORY_CARE_PROVIDER_SITE_OTHER): Payer: Medicaid Other | Admitting: Family Medicine

## 2023-07-31 VITALS — BP 96/64 | HR 78 | Ht 67.0 in | Wt 153.4 lb

## 2023-07-31 DIAGNOSIS — H60543 Acute eczematoid otitis externa, bilateral: Secondary | ICD-10-CM

## 2023-07-31 DIAGNOSIS — Z7689 Persons encountering health services in other specified circumstances: Secondary | ICD-10-CM | POA: Diagnosis not present

## 2023-07-31 DIAGNOSIS — G43E09 Chronic migraine with aura, not intractable, without status migrainosus: Secondary | ICD-10-CM

## 2023-07-31 MED ORDER — NURTEC 75 MG PO TBDP: 1.0000 | ORAL_TABLET | ORAL | 11 refills | Status: DC

## 2023-07-31 MED ORDER — WEGOVY 1 MG/0.5ML ~~LOC~~ SOAJ: 1.0000 mg | SUBCUTANEOUS | 2 refills | Status: DC

## 2023-07-31 MED ORDER — TRIAMCINOLONE ACETONIDE 0.025 % EX CREA: 1.0000 | TOPICAL_CREAM | Freq: Two times a day (BID) | CUTANEOUS | 1 refills | Status: AC

## 2023-07-31 NOTE — Assessment & Plan Note (Signed)
 She struggles with eczema in her ears, experiencing itching and dry skin flaking. She previously used a cream that was effective but is currently using an over-the-counter steroid lotion that is not providing the same relief.  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 Current over-the-counter lotion ineffective. - Prescribe a stronger cream for ear eczema (Triamcinolone  acetonide 0.025%), to be used twice daily for up to 7-14 days. - Avoid use inside the ear canal and use sparingly to avoid skin thinning or atrophy. - If the new cream is ineffective, consider referral to dermatology.

## 2023-07-31 NOTE — Patient Instructions (Signed)
 Patient Plan  1. Chronic Migraine Management    - Take Nurtec every other day for migraine relief and prevention.    - If Nurtec does not help, discuss additional treatment options with a neurologist.  2. Eczema of External Ear    - Use Triamcinolone  Acetonide 0.025% cream twice daily for up to 7-14 days.    - Apply the cream sparingly and avoid using it inside the ear canal.    - If symptoms do not improve, consider a referral to a dermatologist.  3. Weight Management    - Continue with the Wegovy  1 mg weekly dose.    - If further weight loss occurs, contact the office to discuss adjusting the dose.    - Take Lubiprostone  (Amitiza ) regularly as prescribed, instead of as needed.    - Schedule a follow-up appointment in 3 months.  Red Flags: - If you experience any severe headaches, significant skin reactions, or unexpected side effects, please contact the office immediately.

## 2023-07-31 NOTE — Assessment & Plan Note (Signed)
>>  ASSESSMENT AND PLAN FOR ENCOUNTER FOR WEIGHT MANAGEMENT WRITTEN ON 07/31/2023  9:41 AM BY Carly Applegate J, MD  She has experienced a significant weight loss of 16 pounds since May 01, 2023, now within a normal BMI range. She is currently taking Wegovy  as part of her medication regimen and denies significant gastrointestinal side effects. She continues to use lubiprostone  (Amitiza ) for bowel issues as needed.  - Continue current 1 mg weekly dose, if continued weight loss noted, contact our office for lower dose - Recommend dosing lubiprostone  (Amitiza ) on a regular basis as Rx instructions instead of as needed - Return in 3 months

## 2023-07-31 NOTE — Progress Notes (Signed)
 Primary Care / Sports Medicine Office Visit  Patient Information:  Patient ID: TAWANNA FUNK, female DOB: 05-20-1995 Age: 28 y.o. MRN: [id number]   LAUREY SALSER is a pleasant 28 y.o. female presenting with the following:  Chief Complaint  Patient presents with   Weight Check    Vitals:   07/31/23 0905  BP: 96/64  Pulse: 78  SpO2: 99%   Vitals:   07/31/23 0905  Weight: 153 lb 6.4 oz (69.6 kg)  Height: 5\' 7"  (1.702 m)   Body mass index is 24.03 kg/m.  No results found.   Independent interpretation of notes and tests performed by another provider:   None  Procedures performed:   None  Pertinent History, Exam, Impression, and Recommendations:   Problem List Items Addressed This Visit     Chronic migraine with aura - Primary   She experiences persistent migraines, occurring daily, with occasional severe episodes lasting up to 24 hours. These migraines sometimes present with nausea, light sensitivity, and rarely with visual aura. She has a history of using various treatments including Aimovig injections, nerve injections, nasal sprays, and oral medications like Fioricet , which she occasionally borrows from her mother who also suffers from migraines. She has not tried Nurtec yet. Her migraines have been present since childhood and have worsened over time.  Migraine Chronic migraines with daily occurrence, rebound headaches, and medication dependency. Previous treatments include Aimovig, nerve injections, nasal spray, and Fioricet . Nurtec discussed for acute relief and prevention, compatible with current medications. - Prescribe Nurtec for every other day dosing. - If Nurtec is ineffective, consider additional treatment options with neurology.      Relevant Medications   Rimegepant Sulfate (NURTEC) 75 MG TBDP   Eczema of external ear, bilateral   She struggles with eczema in her ears, experiencing itching and dry skin flaking. She previously used a cream that was  effective but is currently using an over-the-counter steroid lotion that is not providing the same relief.  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 Current over-the-counter lotion ineffective. - Prescribe a stronger cream for ear eczema (Triamcinolone  acetonide 0.025%), to be used twice daily for up to 7-14 days. - Avoid use inside the ear canal and use sparingly to avoid skin thinning or atrophy. - If the new cream is ineffective, consider referral to dermatology.      Relevant Medications   triamcinolone  (KENALOG ) 0.025 % cream   Encounter for weight management   She has experienced a significant weight loss of 16 pounds since May 01, 2023, now within a normal BMI range. She is currently taking Wegovy  as part of her medication regimen and denies significant gastrointestinal side effects. She continues to use lubiprostone  (Amitiza ) for bowel issues as needed.  - Continue current 1 mg weekly dose, if continued weight loss noted, contact our office for lower dose - Recommend dosing lubiprostone  (Amitiza ) on a regular basis as Rx instructions instead of as needed - Return in 3 months      Relevant Medications   WEGOVY  1 MG/0.5ML SOAJ     Orders & Medications Medications:  Meds ordered this encounter  Medications   Rimegepant Sulfate (NURTEC) 75 MG TBDP    Sig: Take 1 tablet (75 mg total) by mouth every other day.    Dispense:  14 tablet    Refill:  11   triamcinolone  (KENALOG ) 0.025 % cream    Sig: Apply 1 Application topically 2 (two) times daily.  for up to 7-14 days.    Dispense:  15 g    Refill:  1   WEGOVY  1 MG/0.5ML SOAJ    Sig: Inject 1 mg into the skin once a week.    Dispense:  2 mL    Refill:  2   No orders of the defined types were placed in this encounter.    Return in about 3 months (around 10/31/2023) for Weight MGMT f/u.     Ma Saupe, MD, [facility]   Primary Care Sports Medicine Primary Care and Sports  Medicine at [facility]

## 2023-07-31 NOTE — Assessment & Plan Note (Signed)
 She has experienced a significant weight loss of 16 pounds since May 01, 2023, now within a normal BMI range. She is currently taking Wegovy  as part of her medication regimen and denies significant gastrointestinal side effects. She continues to use lubiprostone  (Amitiza ) for bowel issues as needed.  - Continue current 1 mg weekly dose, if continued weight loss noted, contact our office for lower dose - Recommend dosing lubiprostone  (Amitiza ) on a regular basis as Rx instructions instead of as needed - Return in 3 months

## 2023-07-31 NOTE — Assessment & Plan Note (Signed)
 She experiences persistent migraines, occurring daily, with occasional severe episodes lasting up to 24 hours. These migraines sometimes present with nausea, light sensitivity, and rarely with visual aura. She has a history of using various treatments including Aimovig injections, nerve injections, nasal sprays, and oral medications like Fioricet , which she occasionally borrows from her mother who also suffers from migraines. She has not tried Nurtec yet. Her migraines have been present since childhood and have worsened over time.  Migraine Chronic migraines with daily occurrence, rebound headaches, and medication dependency. Previous treatments include Aimovig, nerve injections, nasal spray, and Fioricet . Nurtec discussed for acute relief and prevention, compatible with current medications. - Prescribe Nurtec for every other day dosing. - If Nurtec is ineffective, consider additional treatment options with neurology.

## 2023-07-31 NOTE — Assessment & Plan Note (Signed)
>>  ASSESSMENT AND PLAN FOR CHRONIC MIGRAINE WITH AURA WRITTEN ON 07/31/2023  9:42 AM BY Merric Yost J, MD  She experiences persistent migraines, occurring daily, with occasional severe episodes lasting up to 24 hours. These migraines sometimes present with nausea, light sensitivity, and rarely with visual aura. She has a history of using various treatments including Aimovig injections, nerve injections, nasal sprays, and oral medications like Fioricet , which she occasionally borrows from her mother who also suffers from migraines. She has not tried Nurtec yet. Her migraines have been present since childhood and have worsened over time.  Migraine Chronic migraines with daily occurrence, rebound headaches, and medication dependency. Previous treatments include Aimovig, nerve injections, nasal spray, and Fioricet . Nurtec discussed for acute relief and prevention, compatible with current medications. - Prescribe Nurtec for every other day dosing. - If Nurtec is ineffective, consider additional treatment options with neurology.

## 2023-08-01 ENCOUNTER — Ambulatory Visit: Admitting: Licensed Clinical Social Worker

## 2023-08-02 ENCOUNTER — Telehealth: Payer: Self-pay | Admitting: Pharmacy Technician

## 2023-08-02 ENCOUNTER — Other Ambulatory Visit (HOSPITAL_COMMUNITY): Payer: Self-pay

## 2023-08-02 NOTE — Telephone Encounter (Signed)
 Pharmacy Patient Advocate Encounter   Received notification from Onbase that prior authorization for  Wegovy  1MG /0.5ML auto-injectors is required/requested.   Insurance verification completed.   The patient is insured through [facility] .   Per test claim: PA required; PA submitted to above mentioned insurance via CoverMyMeds Key/confirmation #/EOC [id number] Status is pending

## 2023-08-03 ENCOUNTER — Other Ambulatory Visit (HOSPITAL_COMMUNITY): Payer: Self-pay

## 2023-08-03 NOTE — Telephone Encounter (Signed)
 Pharmacy Patient Advocate Encounter  Received notification from [facility] that Prior Authorization for Wegovy  1MG /0.5ML auto-injectors has been APPROVED from 08/02/23 to 08/01/24. Ran test claim, Copay is $4.00. This test claim was processed through [facility]- copay amounts may vary at other pharmacies due to pharmacy/plan contracts, or as the patient moves through the different stages of their insurance plan.   PA #/Case ID/Reference #: [id number]

## 2023-08-03 NOTE — Telephone Encounter (Signed)
 FYI

## 2023-08-04 ENCOUNTER — Other Ambulatory Visit (HOSPITAL_COMMUNITY): Payer: Self-pay

## 2023-08-04 ENCOUNTER — Telehealth: Payer: Self-pay | Admitting: Pharmacy Technician

## 2023-08-04 NOTE — Telephone Encounter (Signed)
 Pharmacy Patient Advocate Encounter   Received notification from CoverMyMeds that prior authorization for Nurtec 75MG  dispersible tablets is required/requested.   Insurance verification completed.   The patient is insured through [facility] .   Per test claim: PA required; PA submitted to above mentioned insurance via CoverMyMeds Key/confirmation #/EOC [id number] Status is pending

## 2023-08-04 NOTE — Telephone Encounter (Signed)
Thank you JM

## 2023-08-08 ENCOUNTER — Other Ambulatory Visit (HOSPITAL_COMMUNITY): Payer: Self-pay

## 2023-08-08 NOTE — Telephone Encounter (Signed)
 Pharmacy Patient Advocate Encounter  Received notification from [facility] that Prior Authorization for Nurtec 75 mg has been DENIED.  Full denial letter will be uploaded to the media tab. See denial reason below.      PA #/Case ID/Reference #: [id number]

## 2023-08-10 ENCOUNTER — Encounter: Payer: Self-pay | Admitting: Licensed Clinical Social Worker

## 2023-08-17 ENCOUNTER — Telehealth: Payer: Self-pay | Admitting: Psychiatry

## 2023-08-17 NOTE — Telephone Encounter (Signed)
 PT rescheduled her appt with you tomorrow due to not starting new Rx yet. She states that she is nervous to start due to side effects. PT now has an appt for 7/11th.

## 2023-08-18 ENCOUNTER — Telehealth: Admitting: Psychiatry

## 2023-09-22 ENCOUNTER — Telehealth (INDEPENDENT_AMBULATORY_CARE_PROVIDER_SITE_OTHER): Payer: Self-pay | Admitting: Psychiatry

## 2023-09-22 DIAGNOSIS — F411 Generalized anxiety disorder: Secondary | ICD-10-CM

## 2023-09-22 NOTE — Progress Notes (Signed)
 No response to call or text or video invite. Unable to leave voice mail , VM Box full.

## 2023-10-11 ENCOUNTER — Other Ambulatory Visit: Payer: Self-pay | Admitting: Psychiatry

## 2023-10-11 DIAGNOSIS — F439 Reaction to severe stress, unspecified: Secondary | ICD-10-CM

## 2023-10-11 DIAGNOSIS — F330 Major depressive disorder, recurrent, mild: Secondary | ICD-10-CM

## 2023-10-11 DIAGNOSIS — F411 Generalized anxiety disorder: Secondary | ICD-10-CM

## 2023-11-01 ENCOUNTER — Encounter: Payer: Self-pay | Admitting: Family Medicine

## 2023-11-01 ENCOUNTER — Ambulatory Visit: Admitting: Family Medicine

## 2023-11-01 VITALS — BP 120/84 | HR 91 | Ht 67.0 in | Wt 155.8 lb

## 2023-11-01 DIAGNOSIS — F411 Generalized anxiety disorder: Secondary | ICD-10-CM | POA: Diagnosis not present

## 2023-11-01 DIAGNOSIS — Z7689 Persons encountering health services in other specified circumstances: Secondary | ICD-10-CM

## 2023-11-01 DIAGNOSIS — F439 Reaction to severe stress, unspecified: Secondary | ICD-10-CM

## 2023-11-01 DIAGNOSIS — F330 Major depressive disorder, recurrent, mild: Secondary | ICD-10-CM

## 2023-11-01 DIAGNOSIS — G43E09 Chronic migraine with aura, not intractable, without status migrainosus: Secondary | ICD-10-CM | POA: Diagnosis not present

## 2023-11-01 DIAGNOSIS — G43709 Chronic migraine without aura, not intractable, without status migrainosus: Secondary | ICD-10-CM

## 2023-11-01 MED ORDER — NURTEC 75 MG PO TBDP: 1.0000 | ORAL_TABLET | ORAL | 11 refills | Status: DC

## 2023-11-01 NOTE — Assessment & Plan Note (Signed)
 Migraine symptoms - Experiences daily migraine headaches, not severe - No recent neurology visits - Nurtec prescribed for migraines but not taken due to pharmacy not filling prescription - Headaches attributed to rebound from Tylenol  and other medications  Migraine Experiencing migraines, previously prescribed Nurtec but not taken due to pharmacy issues. Possible rebound headaches from prior medication use and intermittent Tylenol  use. Nurtec has shown high efficacy in other patients. - Provide sample of Nurtec for immediate use. - Resend Nurtec prescription to pharmacy. - Instruct to inquire with pharmacy regarding prescription fulfillment.

## 2023-11-01 NOTE — Assessment & Plan Note (Signed)
 Psychological stress - Experiences ongoing life stress - Has discussed stress with psychiatrist - Previously attended therapy in psychiatrist's office but missed an appointment and was dismissed - No current therapy or counseling   Psychosocial stressors Experiencing stress related to life circumstances. Psychiatrist appointment upcoming. Interested in virtual therapy sessions for convenience. - Refer to adjunct behavioral therapy with a preference for virtual sessions.

## 2023-11-01 NOTE — Progress Notes (Signed)
 Primary Care / Sports Medicine Office Visit  Patient Information:  Patient ID: Sandra Rivera, female DOB: 07/16/1995 Age: 28 y.o. MRN: [id number]   Sandra Rivera is a pleasant 28 y.o. female presenting with the following:  Chief Complaint  Patient presents with   Weight Check    Patient presents today for weight check. She has been taking her Wegovy  shot and doing well. She has no concerns today.    Vitals:   11/01/23 0900  BP: 120/84  Pulse: 91  SpO2: 99%   Vitals:   11/01/23 0900  Weight: 155 lb 12.8 oz (70.7 kg)  Height: 5' 7 (1.702 m)   Body mass index is 24.4 kg/m.  No results found.   Independent interpretation of notes and tests performed by another provider:   None  Procedures performed:   None  Pertinent History, Exam, Impression, and Recommendations:   Problem List Items Addressed This Visit     Chronic migraine w/o aura w/o status migrainosus, not intractable - Primary   Migraine symptoms - Experiences daily migraine headaches, not severe - No recent neurology visits - Nurtec prescribed for migraines but not taken due to pharmacy not filling prescription - Headaches attributed to rebound from Tylenol  and other medications  Migraine Experiencing migraines, previously prescribed Nurtec but not taken due to pharmacy issues. Possible rebound headaches from prior medication use and intermittent Tylenol  use. Nurtec has shown high efficacy in other patients. - Provide sample of Nurtec for immediate use. - Resend Nurtec prescription to pharmacy. - Instruct to inquire with pharmacy regarding prescription fulfillment.      Relevant Medications   Rimegepant Sulfate (NURTEC) 75 MG TBDP   Encounter for weight management   Weight management - Weight stable over the past three months - Body mass index is 24.4 - Currently taking Wegovy   Weight management Obesity well-managed with BMI of 24.4. Wegovy  use has facilitated weight loss through appetite  control. Medicaid coverage ends in October. Advised to maintain dietary habits to sustain weight post-medication. - Continue current dose of Wegovy  until October. - Advise to maintain current dietary habits to manage weight post-medication.      GAD (generalized anxiety disorder)   Psychological stress - Experiences ongoing life stress - Has discussed stress with psychiatrist - Previously attended therapy in psychiatrist's office but missed an appointment and was dismissed - No current therapy or counseling  Psychosocial stressors Experiencing stress related to life circumstances. Psychiatrist appointment upcoming. Interested in virtual therapy sessions for convenience. - Refer to adjunct behavioral therapy with a preference for virtual sessions.       Relevant Orders   Ambulatory referral to Psychology   MDD (major depressive disorder), recurrent episode, mild (HCC)   Relevant Orders   Ambulatory referral to Psychology   Trauma and stressor-related disorder   Relevant Orders   Ambulatory referral to Psychology   Other Visit Diagnoses       Chronic migraine with aura without status migrainosus, not intractable       Relevant Medications   Rimegepant Sulfate (NURTEC) 75 MG TBDP        Orders & Medications Medications:  Meds ordered this encounter  Medications   Rimegepant Sulfate (NURTEC) 75 MG TBDP    Sig: Take 1 tablet (75 mg total) by mouth every other day.    Dispense:  11 tablet    Refill:  11   Orders Placed This Encounter  Procedures   Ambulatory referral to Psychology  No follow-ups on file.     Selinda JINNY Ku, MD, [facility]   Primary Care Sports Medicine Primary Care and Sports Medicine at [facility]

## 2023-11-01 NOTE — Assessment & Plan Note (Signed)
 Weight management - Weight stable over the past three months - Body mass index is 24.4 - Currently taking Wegovy   Weight management Obesity well-managed with BMI of 24.4. Wegovy  use has facilitated weight loss through appetite control. Medicaid coverage ends in October. Advised to maintain dietary habits to sustain weight post-medication. - Continue current dose of Wegovy  until October. - Advise to maintain current dietary habits to manage weight post-medication.

## 2023-11-01 NOTE — Patient Instructions (Signed)
 Patient Plan for Post-Visit Guidance  Migraine Management - Use the provided Nurtec sample for migraine relief as directed. - Nurtec prescription has been resent to your pharmacy; check with the pharmacy to ensure it is filled. - Limit use of Tylenol  and other headache medications to avoid rebound headaches.  Weight Management - Continue current dose of Wegovy  until October. - Maintain current dietary habits to help manage weight after stopping Wegovy .  Anxiety, Depression, and Stress Management - Attend upcoming psychiatrist appointment as scheduled. - Referral placed for behavioral therapy with a preference for virtual sessions.  Red Flags - If you experience sudden severe headache, vision changes, weakness, confusion, chest pain, or thoughts of self-harm, seek medical attention promptly.

## 2023-11-03 ENCOUNTER — Other Ambulatory Visit (HOSPITAL_COMMUNITY): Payer: Self-pay

## 2023-11-03 ENCOUNTER — Telehealth: Payer: Self-pay | Admitting: Pharmacy Technician

## 2023-11-03 NOTE — Telephone Encounter (Signed)
 Pharmacy Patient Advocate Encounter  Received notification from [facility] that Prior Authorization for Nurtec 75MG  dispersible tablets  has been DENIED.  Full denial letter will be uploaded to the media tab. See denial reason below.     PA #/Case ID/Reference #: [id number]

## 2023-11-03 NOTE — Telephone Encounter (Signed)
 Pharmacy Patient Advocate Encounter   Received notification from Onbase that prior authorization for Nurtec 75MG  dispersible tablets  is required/requested.   Insurance verification completed.   The patient is insured through [facility] .   Per test claim: PA required; PA submitted to above mentioned insurance via Latent Key/confirmation #/EOC [id number] Status is pending

## 2023-11-09 ENCOUNTER — Other Ambulatory Visit: Payer: Self-pay | Admitting: Psychiatry

## 2023-11-09 DIAGNOSIS — F330 Major depressive disorder, recurrent, mild: Secondary | ICD-10-CM

## 2023-11-23 ENCOUNTER — Ambulatory Visit (INDEPENDENT_AMBULATORY_CARE_PROVIDER_SITE_OTHER): Payer: Self-pay | Admitting: Psychiatry

## 2023-11-23 ENCOUNTER — Other Ambulatory Visit: Payer: Self-pay

## 2023-11-23 ENCOUNTER — Encounter: Payer: Self-pay | Admitting: Psychiatry

## 2023-11-23 VITALS — BP 117/78 | HR 77 | Temp 97.9°F | Ht 67.0 in | Wt 158.4 lb

## 2023-11-23 DIAGNOSIS — F439 Reaction to severe stress, unspecified: Secondary | ICD-10-CM

## 2023-11-23 DIAGNOSIS — F330 Major depressive disorder, recurrent, mild: Secondary | ICD-10-CM

## 2023-11-23 DIAGNOSIS — F411 Generalized anxiety disorder: Secondary | ICD-10-CM

## 2023-11-23 MED ORDER — BUPROPION HCL ER (XL) 150 MG PO TB24: 150.0000 mg | ORAL_TABLET | Freq: Every morning | ORAL | 1 refills | Status: DC

## 2023-11-23 MED ORDER — FLUOXETINE HCL 20 MG PO CAPS: ORAL_CAPSULE | ORAL | 0 refills | Status: DC

## 2023-11-23 MED ORDER — BUPROPION HCL ER (XL) 300 MG PO TB24: 300.0000 mg | ORAL_TABLET | Freq: Every morning | ORAL | 1 refills | Status: DC

## 2023-11-23 MED ORDER — BUSPIRONE HCL 15 MG PO TABS: 15.0000 mg | ORAL_TABLET | ORAL | Status: DC
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
## 2023-11-23 NOTE — Patient Instructions (Signed)
  [email or web address]  [email or web address]  [email or web address] [facility] [address], [place], [place] [address]   [phone]  [facility] [email or web address] [address] [place], [place] [address]  [phone]  [facility], [facility] [email or web address] [address], [place], [place] [address]  [phone]   [facility] - [phone]  [facility] - [phone]  [facility] counseling - [phone] counseling [phone]  [facility] - [phone]   [facility] [address] [place], [place] [address]  [phone]    Belvie Chancy [address] [place], [place] [address]  [phone]    [facility] [address] [place], [place] [address]  [phone]

## 2023-12-04 ENCOUNTER — Ambulatory Visit: Admitting: Family Medicine

## 2023-12-04 ENCOUNTER — Encounter: Payer: Self-pay | Admitting: Family Medicine

## 2023-12-04 VITALS — BP 102/60 | HR 83 | Ht 67.0 in | Wt 159.0 lb

## 2023-12-04 DIAGNOSIS — Z7689 Persons encountering health services in other specified circumstances: Secondary | ICD-10-CM

## 2023-12-04 DIAGNOSIS — G43709 Chronic migraine without aura, not intractable, without status migrainosus: Secondary | ICD-10-CM | POA: Diagnosis not present

## 2023-12-04 MED ORDER — WEGOVY 1 MG/0.5ML ~~LOC~~ SOAJ: 1.0000 mg | SUBCUTANEOUS | 0 refills | Status: DC

## 2023-12-04 NOTE — Assessment & Plan Note (Signed)
 Weight gain and dietary habits - Recent weight gain attributed to late-night snacking and cravings - Missed Wegovy  (semaglutide ) 1 mg doses for the past two weeks, believed to contribute to weight gain  Obesity Weight gain potentially due to missed medication doses and dietary habits. Decision made to maintain current Wegovy  dose due to recent missed doses. - Continue Wegovy  at 1 mg. - Send prescription for Wegovy  for six months, request to dispense all at once if possible. - Encourage adherence to medication regimen. - Discuss dietary habits and encourage healthier eating patterns.

## 2023-12-04 NOTE — Assessment & Plan Note (Addendum)
 Migraine headaches - Longstanding history of migraines since elementary school - Current use of Nurtec, found effective when tried as a sample - Difficulty obtaining Nurtec due to insurance coverage issues - Previous use of Fioricet  for migraine management  Migraine Migraine management complicated by insurance issues with Nurtec coverage. Nurtec effective but appears to have been denied by Medicaid due to misclassification as MOH. - Contact pharmacy to ensure Nurtec is processed under correct diagnosis of migraines. - Will reach out to our pharmacy team to check about coverage. - Provide Nurtec samples to manage migraines in interim. - Encouraged communication with neurologist regarding migraine management. - Consider alternative pharmacy if current one cannot resolve insurance issues.

## 2023-12-04 NOTE — Progress Notes (Signed)
     Primary Care / Sports Medicine Office Visit  Patient Information:  Patient ID: Sandra Rivera, female DOB: 1995-07-06 Age: 28 y.o. MRN: [id number]   Sandra Rivera is a pleasant 28 y.o. female presenting with the following:  Chief Complaint  Patient presents with   Weight Management Screening    Patient presents today for weight check. She has been taking Wegovy  1 mg. She does nt have any side effects on this dose.    Migraine    Patient would like to discuss migraine treatment. She has been taking a lot BC powder and it is starting to make her feel sick. She has tired the Nurtec samples which help but her insurance will not cover the medication.     Vitals:   12/04/23 1306  BP: 102/60  Pulse: 83  SpO2: 97%   Vitals:   12/04/23 1306  Weight: 159 lb (72.1 kg)  Height: 5' 7 (1.702 m)   Body mass index is 24.9 kg/m.  No results found.   Discussed the use of AI scribe software for clinical note transcription with the patient, who gave verbal consent to proceed.   Independent interpretation of notes and tests performed by another provider:   None  Procedures performed:   None  Pertinent History, Exam, Impression, and Recommendations:   Problem List Items Addressed This Visit     Chronic migraine w/o aura w/o status migrainosus, not intractable - Primary   Migraine headaches - Longstanding history of migraines since elementary school - Current use of Nurtec, found effective when tried as a sample - Difficulty obtaining Nurtec due to insurance coverage issues - Previous use of Fioricet  for migraine management  Migraine Migraine management complicated by insurance issues with Nurtec coverage. Nurtec effective but appears to have been denied by Medicaid due to misclassification as MOH. - Contact pharmacy to ensure Nurtec is processed under correct diagnosis of migraines. - Will reach out to our pharmacy team to check about coverage. - Provide Nurtec samples to  manage migraines in interim. - Encouraged communication with neurologist regarding migraine management. - Consider alternative pharmacy if current one cannot resolve insurance issues.      Encounter for weight management   Weight gain and dietary habits - Recent weight gain attributed to late-night snacking and cravings - Missed Wegovy  (semaglutide ) 1 mg doses for the past two weeks, believed to contribute to weight gain  Obesity Weight gain potentially due to missed medication doses and dietary habits. Decision made to maintain current Wegovy  dose due to recent missed doses. - Continue Wegovy  at 1 mg. - Send prescription for Wegovy  for six months, request to dispense all at once if possible. - Encourage adherence to medication regimen. - Discuss dietary habits and encourage healthier eating patterns.      Relevant Medications   WEGOVY  1 MG/0.5ML SOAJ SQ injection     Orders & Medications Medications:  Meds ordered this encounter  Medications   WEGOVY  1 MG/0.5ML SOAJ SQ injection    Sig: Inject 1 mg into the skin once a week.    Dispense:  12 mL    Refill:  0   No orders of the defined types were placed in this encounter.    Return in about 6 weeks (around 01/15/2024) for 6-8 weeks for weight mgmt and migraine f/u.     Selinda JINNY Ku, MD, [facility]   Primary Care Sports Medicine Primary Care and Sports Medicine at [facility]

## 2023-12-04 NOTE — Patient Instructions (Signed)
 VISIT SUMMARY:  During your visit, we discussed your recent weight gain, migraine management, and mood disturbances. We addressed your medication adherence and provided guidance on dietary habits.  YOUR PLAN:  MIGRAINE: You have a history of migraines and have found Nurtec effective, but there are insurance issues. -We will contact the pharmacy team to ensure Nurtec is processed under the correct diagnosis of migraines. -In the meantime, we will provide you with Nurtec samples to manage your migraines. -Please communicate with your neurologist regarding your migraine management. -Consider using an alternative pharmacy if the current one cannot resolve the insurance issues.  OBESITY: Your recent weight gain may be due to missed medication doses and dietary habits. -Continue taking Wegovy  at 1 mg. -We will send a prescription for Wegovy  for six months and request to dispense all at once if possible. -Please adhere to your medication regimen. -We discussed your dietary habits and encourage you to adopt healthier eating patterns.

## 2023-12-05 ENCOUNTER — Other Ambulatory Visit (HOSPITAL_COMMUNITY): Payer: Self-pay

## 2023-12-05 ENCOUNTER — Telehealth: Payer: Self-pay | Admitting: Pharmacy Technician

## 2023-12-05 NOTE — Telephone Encounter (Signed)
 Pharmacy Patient Advocate Encounter   Received notification from Pt Calls Messages that prior authorization for Nurtec 75MG  dispersible tablets  is required/requested.   Insurance verification completed.   The patient is insured through [employer] .   Per test claim: PA required and submitted KEY/EOC/Request #: [id number] from 12/05/23 to 03/04/24. Ran test claim, Copay is $4.00. This test claim was processed through [facility]- copay amounts may vary at other pharmacies due to pharmacy/plan contracts, or as the patient moves through the different stages of their insurance plan.

## 2023-12-05 NOTE — Telephone Encounter (Signed)
 Good morning Dr. Alvia resubmitted and approved this morning!

## 2023-12-05 NOTE — Telephone Encounter (Signed)
   My apologies, I have 3 clinics and I updated under the wrong clinic so this may look like a duplicate

## 2023-12-09 ENCOUNTER — Other Ambulatory Visit: Payer: Self-pay | Admitting: Psychiatry

## 2023-12-09 DIAGNOSIS — F411 Generalized anxiety disorder: Secondary | ICD-10-CM

## 2023-12-09 DIAGNOSIS — F439 Reaction to severe stress, unspecified: Secondary | ICD-10-CM

## 2023-12-09 DIAGNOSIS — F330 Major depressive disorder, recurrent, mild: Secondary | ICD-10-CM

## 2023-12-10 ENCOUNTER — Other Ambulatory Visit: Payer: Self-pay | Admitting: Family Medicine

## 2023-12-10 DIAGNOSIS — G43E09 Chronic migraine with aura, not intractable, without status migrainosus: Secondary | ICD-10-CM

## 2023-12-10 MED ORDER — NURTEC 75 MG PO TBDP: 1.0000 | ORAL_TABLET | ORAL | 11 refills | Status: DC

## 2023-12-13 ENCOUNTER — Emergency Department
Admission: EM | Admit: 2023-12-13 | Discharge: 2023-12-13 | Disposition: A | Discharge status: Home / Self Care | Attending: Emergency Medicine | Admitting: Emergency Medicine

## 2023-12-13 ENCOUNTER — Other Ambulatory Visit: Payer: Self-pay

## 2023-12-13 DIAGNOSIS — R112 Nausea with vomiting, unspecified: Secondary | ICD-10-CM | POA: Diagnosis present

## 2023-12-13 LAB — COMPREHENSIVE METABOLIC PANEL WITH GFR
ALT: 16 U/L (ref 0–44)
AST: 18 U/L (ref 15–41)
Albumin: 4.2 g/dL (ref 3.5–5.0)
Alkaline Phosphatase: 44 U/L (ref 38–126)
Anion gap: 12 (ref 5–15)
BUN: 13 mg/dL (ref 6–20)
CO2: 27 mmol/L (ref 22–32)
Calcium: 9.6 mg/dL (ref 8.9–10.3)
Chloride: 97 mmol/L — ABNORMAL LOW (ref 98–111)
Creatinine, Ser: 0.63 mg/dL (ref 0.44–1.00)
GFR, Estimated: >60 mL/min (ref >60)
Glucose, Bld: 89 mg/dL (ref 70–99)
Potassium: 3.8 mmol/L (ref 3.5–5.1)
Sodium: 136 mmol/L (ref 135–145)
Total Bilirubin: 0.9 mg/dL (ref 0.0–1.2)
Total Protein: 7.5 g/dL (ref 6.5–8.1)

## 2023-12-13 LAB — CBC
HCT: 43.2 % (ref 36.0–46.0)
Hemoglobin: 14.3 g/dL (ref 12.0–15.0)
MCH: 26.8 pg (ref 26.0–34.0)
MCHC: 33.1 g/dL (ref 30.0–36.0)
MCV: 80.9 fL (ref 80.0–100.0)
Platelets: 261 K/uL (ref 150–400)
RBC: 5.34 MIL/uL — ABNORMAL HIGH (ref 3.87–5.11)
RDW: 13 % (ref 11.5–15.5)
WBC: 7.3 K/uL (ref 4.0–10.5)
nRBC: 0 % (ref 0.0–0.2)

## 2023-12-13 LAB — URINALYSIS, ROUTINE W REFLEX MICROSCOPIC
Bilirubin Urine: NEGATIVE
Glucose, UA: NEGATIVE mg/dL
Hgb urine dipstick: NEGATIVE
Ketones, ur: 20 mg/dL — AB
Nitrite: NEGATIVE
Protein, ur: NEGATIVE mg/dL
Specific Gravity, Urine: 1.014 (ref 1.005–1.030)
pH: 5 (ref 5.0–8.0)

## 2023-12-13 LAB — POC URINE PREG, ED: Preg Test, Ur: NEGATIVE

## 2023-12-13 MED ORDER — KETOROLAC TROMETHAMINE 15 MG/ML IJ SOLN
15.0000 mg | Freq: Once | INTRAMUSCULAR | Status: AC
  Administered 2023-12-13: 15 mg via INTRAVENOUS
  Filled 2023-12-13: qty 1

## 2023-12-13 MED ORDER — ONDANSETRON 4 MG PO TBDP: 4.0000 mg | ORAL_TABLET | Freq: Three times a day (TID) | ORAL | 0 refills | Status: AC | PRN

## 2023-12-13 MED ORDER — ONDANSETRON HCL 4 MG/2ML IJ SOLN
4.0000 mg | Freq: Once | INTRAMUSCULAR | Status: AC
  Administered 2023-12-13: 4 mg via INTRAVENOUS
  Filled 2023-12-13: qty 2

## 2023-12-13 MED ORDER — SODIUM CHLORIDE 0.9 % IV BOLUS
1000.0000 mL | Freq: Once | INTRAVENOUS | Status: AC
  Administered 2023-12-13: 1000 mL via INTRAVENOUS

## 2023-12-13 NOTE — ED Notes (Signed)
 Pt ambulatory to bathroom to provide urine specimen

## 2023-12-13 NOTE — ED Triage Notes (Signed)
 Patient states she started Wegovy  two days ago; has been vomiting and complaining of a migraine since.

## 2023-12-13 NOTE — Discharge Instructions (Signed)
 Follow-up with your primary care provider if any continued problems.  A prescription for Zofran  was sent to the pharmacy to take every 8 hours as needed for nausea.  Most likely her nausea is due to the dose of Wegovy  that you restarted rather than tapering up on the dosage as in the beginning.  Increase fluids to stay hydrated.

## 2023-12-13 NOTE — ED Provider Notes (Signed)
 [facility] Provider Note    Event Date/Time   First MD Initiated Contact with Patient 12/13/23 0845     (approximate)   History   Emesis   HPI  Sandra Rivera is a 28 y.o. female   presents to the ED with complaint of nausea and vomiting for the last 2 days.  Patient states that she has vomited approximately 30 times in the last 48 hours and also is complaining of her usual migraine headache.  Patient reports that she was off Wegovy  for 4 weeks at the higher dose.  She states that when she noticed that she gained 2 pounds 2 days ago she took a full dose of the Wegovy  1mg .  Patient normally takes Nurtec for migraines but states that she was unable to tolerate this by mouth due to the nausea.  No fever, chills, urinary symptoms with this.  Patient has history of migraines, anxiety, arrhythmia and depression.     Physical Exam   Triage Vital Signs: ED Triage Vitals  Encounter Vitals Group     BP 12/13/23 0835 (!) 128/93     Girls Systolic BP Percentile --      Girls Diastolic BP Percentile --      Boys Systolic BP Percentile --      Boys Diastolic BP Percentile --      Pulse Rate 12/13/23 0835 (!) 117     Resp 12/13/23 0835 20     Temp 12/13/23 0835 98.1 F (36.7 C)     Temp Source 12/13/23 0835 Oral     SpO2 12/13/23 0835 100 %     Weight 12/13/23 0834 162 lb (73.5 kg)     Height 12/13/23 0834 5' 6 (1.676 m)     Head Circumference --      Peak Flow --      Pain Score 12/13/23 0834 10     Pain Loc --      Pain Education --      Exclude from Growth Chart --     Most recent vital signs: Vitals:   12/13/23 1006 12/13/23 1100  BP: 104/65 116/82  Pulse: 86 88  Resp: 15   Temp:    SpO2: 100% 99%     General: Awake, no distress.  Nauseous at present.  Photosensitivity.  Able to talk in complete sentences and answer questions appropriately. CV:  Good peripheral perfusion.  Heart regular rate and rhythm. Resp:  Normal effort.  Lungs clear  bilaterally. Abd:  No distention.  Soft. Other:  PERRLA, EOMI's, cranial nerves II through XII grossly intact.  Speech is normal.  Patient was able to stand and ambulate to the restroom without assistance.   ED Results / Procedures / Treatments   Labs (all labs ordered are listed, but only abnormal results are displayed) Labs Reviewed  COMPREHENSIVE METABOLIC PANEL WITH GFR - Abnormal; Notable for the following components:      Result Value   Chloride 97 (*)    All other components within normal limits  CBC - Abnormal; Notable for the following components:   RBC 5.34 (*)    All other components within normal limits  URINALYSIS, ROUTINE W REFLEX MICROSCOPIC - Abnormal; Notable for the following components:   Color, Urine YELLOW (*)    APPearance CLEAR (*)    Ketones, ur 20 (*)    Leukocytes,Ua SMALL (*)    Bacteria, UA RARE (*)    All other components within normal limits  POC URINE PREG, ED     PROCEDURES:  Critical Care performed:   Procedures   MEDICATIONS ORDERED IN ED: Medications  sodium chloride  0.9 % bolus 1,000 mL (0 mLs Intravenous Stopped 12/13/23 1107)  ondansetron  (ZOFRAN ) injection 4 mg (4 mg Intravenous Given 12/13/23 0859)  ketorolac  (TORADOL ) 15 MG/ML injection 15 mg (15 mg Intravenous Given 12/13/23 1244)     IMPRESSION / MDM / ASSESSMENT AND PLAN / ED COURSE  I reviewed the triage vital signs and the nursing notes.   Differential diagnosis includes, but is not limited to, gastroenteritis, viral illness, side effect secondary to restarting medication, migraine.  ----------------------------------------- 12:56 PM on 12/13/2023 ----------------------------------------- Patient was given IV fluids and nausea has improved greatly.  Toradol  15 mg IV to be given for her headache that persist.  No continued vomiting since the Zofran  that was given IV.  ----------------------------------------- 1:17 PM on  12/13/2023 ----------------------------------------- Headache is improving and patient is able to sleep.  I spoke to her about following up with her PCP and she is aware that a prescription for Zofran  is at her pharmacy.  28 year old female presents to the ED with complaint of nausea and vomiting for the last 48 hours.  Patient states that she began 2 days ago after giving herself an injection of Wegovy  after stopping it 4 weeks ago.  She states that she gained 2 pounds and therefore gave herself an injection that was leftover starting at the higher dose.  No continued nausea or vomiting at the time of discharge and patient states that her headache has improved.  She is aware that Zofran  was sent to the pharmacy for her to continue taking.  She is to follow-up with her PCP if any continued problems and return to the emergency department if any severe worsening of her symptoms.    Patient's presentation is most consistent with acute complicated illness / injury requiring diagnostic workup.  FINAL CLINICAL IMPRESSION(S) / ED DIAGNOSES   Final diagnoses:  Nausea and vomiting, unspecified vomiting type     Rx / DC Orders   ED Discharge Orders          Ordered    ondansetron  (ZOFRAN -ODT) 4 MG disintegrating tablet  Every 8 hours PRN        12/13/23 1229             Note:  This document was prepared using Dragon voice recognition software and may include unintentional dictation errors.   Saunders Shona CROME, PA-C 12/13/23 1320    Dorothyann Drivers, MD 12/13/23 1439

## 2023-12-13 NOTE — ED Notes (Signed)
 Pt discharged at this time. RN reviewed discharge instructions with pt. Pt verbalized understanding. Vital signs taken. RR even and unlabored. Pt denies any questions or needs at this time. Pt ambulatory at discharge.

## 2023-12-13 NOTE — ED Notes (Signed)
 Pt reports improvement in sx and how she feels.

## 2023-12-14 ENCOUNTER — Telehealth: Payer: Self-pay

## 2023-12-14 NOTE — Transitions of Care (Post Inpatient/ED Visit) (Signed)
 12/14/2023  Name: Sandra Rivera MRN: [id number] DOB: [birth year]  Today's TOC FU Call Status: Today's TOC FU Call Status:: Successful TOC FU Call Completed TOC FU Call Complete Date: 12/14/23 Patient's Name and Date of Birth confirmed.  Transition Care Management Follow-up Telephone Call Date of Discharge: 12/14/23 Discharge Facility: [facility]) Type of Discharge: Emergency Department Reason for ED Visit: Other: How have you been since you were released from the hospital?: Better Any questions or concerns?: No  Items Reviewed: Did you receive and understand the discharge instructions provided?: Yes Medications obtained,verified, and reconciled?: Yes (Medications Reviewed) Any new allergies since your discharge?: No Dietary orders reviewed?: NA Do you have support at home?: No  Medications Reviewed Today: Medications Reviewed Today     Reviewed by Delorice Bannister, CMA (Certified Medical Assistant) on 12/14/23 at [phone]  Med List Status: <None>   Medication Order Taking? Sig Documenting Provider Last Dose Status Informant  buPROPion  (WELLBUTRIN  XL) 150 MG 24 hr tablet [id number]  Take 1 tablet (150 mg total) by mouth in the morning. Take along with 300 mg daily Eappen, Saramma, MD  Active   buPROPion  (WELLBUTRIN  XL) 300 MG 24 hr tablet [id number]  Take 1 tablet (300 mg total) by mouth in the morning. Take along with 150 mg daily Eappen, Saramma, MD  Active   busPIRone  (BUSPAR ) 15 MG tablet [id number]  Take 1 tablet (15 mg total) by mouth daily. Eappen, Saramma, MD  Active   FLUoxetine  (PROZAC ) 20 MG capsule [id number]  Take 1 capsule (20 mg total) by mouth daily for 15 days, THEN 2 capsules (40 mg total) daily for 15 days. Eappen, Saramma, MD  Active   hydrOXYzine  (ATARAX ) 25 MG tablet [id number]  Take 1 tablet (25 mg total) by mouth 2 (two) times daily as needed. Eappen, Saramma, MD  Active   lubiprostone  (AMITIZA ) 8 MCG capsule [id number]  Take 1 capsule (8  mcg total) by mouth 2 (two) times daily with a meal.  Patient not taking: Reported on 12/04/2023   Alvia Selinda PARAS, MD  Active            Med Note [facility], Olevia Westervelt   Mon Jul 31, 2023  9:37 AM) PRN  ondansetron  (ZOFRAN -ODT) 4 MG disintegrating tablet [id number]  Take 1 tablet (4 mg total) by mouth every 8 (eight) hours as needed for nausea or vomiting. Saunders Shona CROME, PA-C  Active   Rimegepant Sulfate (NURTEC) 75 MG TBDP [id number]  Take 1 tablet (75 mg total) by mouth every other day. Alvia Selinda PARAS, MD  Active   triamcinolone  (KENALOG ) 0.025 % cream [id number]  Apply 1 Application topically 2 (two) times daily. for up to 7-14 days. Alvia Selinda PARAS, MD  Active   WEGOVY  1 MG/0.5ML SOAJ SQ injection [id number]  Inject 1 mg into the skin once a week. Alvia Selinda PARAS, MD  Active             Home Care and Equipment/Supplies: Were Home Health Services Ordered?: No Any new equipment or medical supplies ordered?: No  Functional Questionnaire: Do you need assistance with bathing/showering or dressing?: No Do you need assistance with meal preparation?: No Do you need assistance with eating?: No Do you have difficulty maintaining continence: No Do you need assistance with getting out of bed/getting out of a chair/moving?: No Do you have difficulty managing or taking your medications?: No  Follow up appointments reviewed: PCP Follow-up appointment confirmed?: NA Specialist Hospital Follow-up appointment confirmed?: NA Do  you need transportation to your follow-up appointment?: No Do you understand care options if your condition(s) worsen?: Yes-patient verbalized understanding    SIGNATURE  Roetta Chard, CMA

## 2023-12-21 ENCOUNTER — Other Ambulatory Visit: Payer: Self-pay | Admitting: Psychiatry

## 2023-12-21 DIAGNOSIS — F330 Major depressive disorder, recurrent, mild: Secondary | ICD-10-CM

## 2023-12-21 DIAGNOSIS — F411 Generalized anxiety disorder: Secondary | ICD-10-CM

## 2023-12-21 DIAGNOSIS — F439 Reaction to severe stress, unspecified: Secondary | ICD-10-CM

## 2024-01-05 ENCOUNTER — Other Ambulatory Visit (HOSPITAL_COMMUNITY): Payer: Self-pay

## 2024-01-05 ENCOUNTER — Telehealth: Payer: Self-pay | Admitting: Pharmacy Technician

## 2024-01-05 NOTE — Telephone Encounter (Signed)
 Pharmacy Patient Advocate Encounter   Received notification from Onbase that prior authorization for Wegovy  1mg /0.61ml pen is required/requested.   Insurance verification completed.   The patient is insured through [employer].   Per test claim: Effective October 1st, Medicaid will discontinue coverage of GLP1 medications for weight loss (such as Wegovy  and Zepbound ), unless the patient has a documented history of a heart attack or stroke. Zepbound  will continue to be covered only for patients with moderate to severe sleep apnea (AHI 15-30) and a BMI greater than 40. Because of this change, the prior authorization team will not be submitting new PA requests for GLP1 medications prescribed for weight loss, as patients will be unable to continue therapy under Medicaid coverage.

## 2024-01-06 ENCOUNTER — Other Ambulatory Visit: Payer: Self-pay | Admitting: Psychiatry

## 2024-01-06 DIAGNOSIS — F330 Major depressive disorder, recurrent, mild: Secondary | ICD-10-CM

## 2024-01-11 ENCOUNTER — Encounter: Payer: Self-pay | Admitting: Psychiatry

## 2024-01-11 ENCOUNTER — Telehealth: Payer: Self-pay | Admitting: Psychiatry

## 2024-01-11 ENCOUNTER — Ambulatory Visit: Admitting: Psychiatry

## 2024-01-11 DIAGNOSIS — F439 Reaction to severe stress, unspecified: Secondary | ICD-10-CM

## 2024-01-11 DIAGNOSIS — F330 Major depressive disorder, recurrent, mild: Secondary | ICD-10-CM

## 2024-01-11 DIAGNOSIS — F411 Generalized anxiety disorder: Secondary | ICD-10-CM

## 2024-01-11 MED ORDER — BUPROPION HCL ER (XL) 300 MG PO TB24: 300.0000 mg | ORAL_TABLET | Freq: Every morning | ORAL | 0 refills | Status: AC

## 2024-01-11 MED ORDER — HYDROXYZINE HCL 25 MG PO TABS: 25.0000 mg | ORAL_TABLET | Freq: Two times a day (BID) | ORAL | 0 refills | Status: AC | PRN

## 2024-01-11 MED ORDER — FLUOXETINE HCL 20 MG PO CAPS: 40.0000 mg | ORAL_CAPSULE | Freq: Every day | ORAL | 0 refills | Status: AC

## 2024-01-11 NOTE — Telephone Encounter (Signed)
 Patient no-showed appointment again today.  Will go ahead and send 30-day supply of medication refills for any psychotropics prescribed by this writer that needs a refill at this time.  Will also print out a letter with resources in the community.  Patient was previously made aware of clinic policy/no-show policy.

## 2024-01-29 ENCOUNTER — Ambulatory Visit: Admitting: Family Medicine

## 2024-01-29 ENCOUNTER — Encounter: Payer: Self-pay | Admitting: Family Medicine

## 2024-01-29 VITALS — BP 102/64 | HR 82 | Ht 66.0 in | Wt 164.0 lb

## 2024-01-29 DIAGNOSIS — F439 Reaction to severe stress, unspecified: Secondary | ICD-10-CM

## 2024-01-29 DIAGNOSIS — F330 Major depressive disorder, recurrent, mild: Secondary | ICD-10-CM

## 2024-01-29 DIAGNOSIS — Z6826 Body mass index (BMI) 26.0-26.9, adult: Secondary | ICD-10-CM

## 2024-01-29 DIAGNOSIS — E663 Overweight: Secondary | ICD-10-CM | POA: Diagnosis not present

## 2024-01-29 DIAGNOSIS — F411 Generalized anxiety disorder: Secondary | ICD-10-CM

## 2024-01-29 DIAGNOSIS — R0683 Snoring: Secondary | ICD-10-CM | POA: Diagnosis not present

## 2024-01-29 NOTE — Patient Instructions (Signed)
 VISIT SUMMARY:  During your visit, we discussed your concerns about weight management, medication side effects, sleep disturbances, and anxiety. We have outlined a plan to address each of these issues, including referrals and potential changes to your medication regimen.  YOUR PLAN:  OVERWEIGHT: You are concerned about weight gain, especially after stopping Wegovy , and have experienced severe side effects from Wegovy . -We discussed the possibility of using naltrexone  for weight loss, pending approval from your psychiatrist. -You are encouraged to work out at the gym 2-3 times per week. -You have been referred to sleep medicine for a sleep apnea evaluation.  MAJOR DEPRESSIVE DISORDER AND GENERALIZED ANXIETY DISORDER: You are currently taking Wellbutrin  and hydroxyzine  for anxiety and depression, but you are seeking a second opinion for medication management. -You have been referred to a new psychiatrist for a second opinion on your medication regimen. -Continue taking your current medications until you establish care with the new psychiatrist. -Use hydroxyzine  as needed for anxiety.  SUSPECTED OBSTRUCTIVE SLEEP APNEA: You have symptoms of fatigue and snoring, and a previous home sleep test was inconclusive. -You have been referred to sleep medicine for a comprehensive sleep study.  DECREASED LIBIDO SECONDARY TO PSYCHIATRIC MEDICATION: Your current psychiatric medications may be affecting your libido. -Discuss alternative psychiatric medications with your new psychiatrist, including potential SNRIs like Pristiq.

## 2024-01-29 NOTE — Assessment & Plan Note (Signed)
 Weight management concerns - Concerned about weight gain, particularly after discontinuing amphetamines, despite not overeating - History of under-eating and worry about regaining weight - Plans to start exercising at the gym two to three times per week, especially on days when her children are with their father  Plan  Overweight Previous adverse reaction to Wegovy  limits options. Discussed naltrexone  for weight loss, pending psychiatric approval. - Referred to sleep medicine for sleep apnea evaluation. - Encouraged gym workouts 2-3 times weekly. - Consider naltrexone  if weight gain occurs, pending psychiatric approval.

## 2024-01-29 NOTE — Progress Notes (Signed)
 Primary Care / Sports Medicine Office Visit  Patient Information:  Patient ID: Sandra Rivera, female DOB: 11-18-1995 Age: 28 y.o. MRN: [id number]   Sandra Rivera is a pleasant 28 y.o. female presenting with the following:  Chief Complaint  Patient presents with   Weight Check    Patient presents today for a follow up on her weight. She has not been taking Wegovy  as her insurance does not cover it.     Vitals:   01/29/24 0924  BP: 102/64  Pulse: 82  SpO2: 99%   Vitals:   01/29/24 0924  Weight: 164 lb (74.4 kg)  Height: 5' 6 (1.676 m)   Body mass index is 26.47 kg/m.  No results found.   Discussed the use of AI scribe software for clinical note transcription with the patient, who gave verbal consent to proceed.   Independent interpretation of notes and tests performed by another provider:   None  Procedures performed:   None  Pertinent History, Exam, Impression, and Recommendations:   Problem List Items Addressed This Visit     GAD (generalized anxiety disorder)   See additional assessment(s) for plan details.      Relevant Orders   Ambulatory referral to Psychiatry   MDD (major depressive disorder), recurrent episode, mild   Major depressive disorder and generalized anxiety disorder Current regimen includes Wellbutrin , possibly affecting libido. Seeking second opinion for medication management. - Referred to new psychiatrist for medication management. - Continue current medications until new psychiatrist is established. - Use hydroxyzine  as needed for anxiety.  Decreased libido secondary to psychiatric medication Interested in alternative medications with new psychiatrist. - Discuss concerns over psychiatric medications with new psychiatrist      Relevant Orders   Ambulatory referral to Psychiatry   Overweight with body mass index (BMI) of 26 to 26.9 in adult - Primary   Weight management concerns - Concerned about weight gain, particularly  after discontinuing amphetamines, despite not overeating - History of under-eating and worry about regaining weight - Plans to start exercising at the gym two to three times per week, especially on days when her children are with their father  Plan Overweight Previous adverse reaction to Wegovy  limits options. Discussed naltrexone  for weight loss, pending psychiatric approval. - Referred to sleep medicine for sleep apnea evaluation. - Encouraged gym workouts 2-3 times weekly. - Consider naltrexone  if weight gain occurs, pending psychiatric approval.      Snoring   Sleep disturbance and fatigue - Persistent fatigue and low energy levels - Snoring present - Previously underwent a home sleep apnea test due to family history (father) of sleep apnea; results were inconclusive - Attributes fatigue to possible sleep apnea or depression  Plan Suspected obstructive sleep apnea Symptoms include fatigue and snoring. Previous home sleep test inconclusive. Discussed insurance coverage for Zepbap if moderate/severe apnea diagnosed. - Referred to sleep medicine for comprehensive sleep study.      Relevant Orders   Ambulatory referral to Sleep Studies   Trauma and stressor-related disorder   Relevant Orders   Ambulatory referral to Psychiatry     Orders & Medications Medications: No orders of the defined types were placed in this encounter.  Orders Placed This Encounter  Procedures   Ambulatory referral to Sleep Studies   Ambulatory referral to Psychiatry     Return in about 2 months (around 04/14/2024) for CPE.     Hawkins Seaman J Xsavier Seeley, MD, [facility]   Primary Care Sports Medicine Primary Care  and Sports Medicine at [employer]

## 2024-01-29 NOTE — Assessment & Plan Note (Signed)
 Sleep disturbance and fatigue - Persistent fatigue and low energy levels - Snoring present - Previously underwent a home sleep apnea test due to family history (father) of sleep apnea; results were inconclusive - Attributes fatigue to possible sleep apnea or depression  Plan  Suspected obstructive sleep apnea Symptoms include fatigue and snoring. Previous home sleep test inconclusive. Discussed insurance coverage for Zepbap if moderate/severe apnea diagnosed. - Referred to sleep medicine for comprehensive sleep study.

## 2024-01-29 NOTE — Assessment & Plan Note (Signed)
 Major depressive disorder and generalized anxiety disorder Current regimen includes Wellbutrin , possibly affecting libido. Seeking second opinion for medication management. - Referred to new psychiatrist for medication management. - Continue current medications until new psychiatrist is established. - Use hydroxyzine  as needed for anxiety.  Decreased libido secondary to psychiatric medication Interested in alternative medications with new psychiatrist. - Discuss concerns over psychiatric medications with new psychiatrist

## 2024-01-29 NOTE — Assessment & Plan Note (Signed)
 See additional assessment(s) for plan details.

## 2024-01-31 ENCOUNTER — Telehealth: Payer: Self-pay | Admitting: Psychiatry

## 2024-01-31 NOTE — Telephone Encounter (Signed)
 Patient was mailed a certified letter of dismissal. It was returned to us  and signed by her mother Olam Finder.

## 2024-01-31 NOTE — Telephone Encounter (Signed)
 So how do we address this now ?

## 2024-02-05 ENCOUNTER — Encounter: Payer: Self-pay | Admitting: Psychiatry

## 2024-02-05 ENCOUNTER — Telehealth: Payer: Self-pay | Admitting: Psychiatry

## 2024-02-05 NOTE — Telephone Encounter (Signed)
 I have sent a letter with resources through MyChart since patient did not receive the letter that was mailed out previously.

## 2024-02-19 ENCOUNTER — Other Ambulatory Visit: Payer: Self-pay | Admitting: Psychiatry

## 2024-02-19 DIAGNOSIS — F330 Major depressive disorder, recurrent, mild: Secondary | ICD-10-CM

## 2024-02-19 DIAGNOSIS — F411 Generalized anxiety disorder: Secondary | ICD-10-CM

## 2024-02-19 DIAGNOSIS — F439 Reaction to severe stress, unspecified: Secondary | ICD-10-CM

## 2024-02-22 ENCOUNTER — Telehealth: Payer: Self-pay | Admitting: Pharmacy Technician

## 2024-02-22 ENCOUNTER — Other Ambulatory Visit (HOSPITAL_COMMUNITY): Payer: Self-pay

## 2024-02-22 NOTE — Telephone Encounter (Signed)
 Pharmacy Patient Advocate Encounter   Received notification from Onbase/CMM that prior authorization for Nurtec is due for renewal.   Insurance verification completed.   The patient is insured through [employer].  Unfortunately the Nurtec wasn't mentioned in the last office note, but the plan requires updated chart notes for PA renewal. It asks for documentation on improvement in overall function with therapy, and decreased in number, frequency, and/or intensity of headaches.  Looks like her current PA is good through 03/04/24. I would recommend the pt filling her prescription again as close to that date as possible, so that it will cover her until her appointment in January. Then we can submit those notes to get the renewal. Just make sure to go over how the Nurtec is doing. Thanks.

## 2024-03-21 ENCOUNTER — Other Ambulatory Visit (HOSPITAL_COMMUNITY): Payer: Self-pay

## 2024-04-02 ENCOUNTER — Encounter: Payer: Self-pay | Admitting: Family Medicine

## 2024-04-02 ENCOUNTER — Encounter: Admitting: Family Medicine

## 2024-04-03 ENCOUNTER — Other Ambulatory Visit: Payer: Self-pay | Admitting: Psychiatry

## 2024-04-03 DIAGNOSIS — F330 Major depressive disorder, recurrent, mild: Secondary | ICD-10-CM

## 2024-04-03 DIAGNOSIS — F439 Reaction to severe stress, unspecified: Secondary | ICD-10-CM

## 2024-04-03 DIAGNOSIS — F411 Generalized anxiety disorder: Secondary | ICD-10-CM

## 2024-04-11 ENCOUNTER — Encounter: Admitting: Family Medicine

## 2024-04-12 ENCOUNTER — Telehealth: Admitting: Family Medicine

## 2024-04-12 ENCOUNTER — Encounter: Payer: Self-pay | Admitting: Family Medicine

## 2024-04-12 DIAGNOSIS — K2900 Acute gastritis without bleeding: Secondary | ICD-10-CM

## 2024-04-12 DIAGNOSIS — Z6828 Body mass index (BMI) 28.0-28.9, adult: Secondary | ICD-10-CM | POA: Diagnosis not present

## 2024-04-12 DIAGNOSIS — G43E09 Chronic migraine with aura, not intractable, without status migrainosus: Secondary | ICD-10-CM

## 2024-04-12 MED ORDER — WEGOVY 0.25 MG/0.5ML ~~LOC~~ SOAJ: 0.2500 mg | SUBCUTANEOUS | 2 refills | Status: AC

## 2024-04-12 MED ORDER — PANTOPRAZOLE SODIUM 40 MG PO TBEC: 40.0000 mg | DELAYED_RELEASE_TABLET | Freq: Every day | ORAL | 0 refills | Status: AC

## 2024-04-12 MED ORDER — NURTEC 75 MG PO TBDP: 1.0000 | ORAL_TABLET | ORAL | 11 refills | Status: AC

## 2024-04-12 NOTE — Patient Instructions (Signed)
" °  VISIT SUMMARY: During your visit, we discussed your chronic migraines, upper abdominal pain, weight management, and mental health. We have made several changes to your treatment plan and provided referrals for further evaluation.  YOUR PLAN: CHRONIC MIGRAINE WITH MEDICATION OVERUSE: You have been experiencing daily migraines and may have rebound headaches due to frequent use of BC powders and other medications. -Take rimegepant (Nurtec) every other day as a preventive measure. -Avoid anti-inflammatory medications and minimize acetaminophen  use. -Use topical pain relief methods. -We have referred you to a neurologist for further management. -Contact our office if you have any issues accessing rimegepant.  GASTRITIS: You have chronic upper abdominal pain likely due to excessive NSAID use, which can cause stomach issues. -Take pantoprazole  40 mg daily for one month. You have a refill available. -Avoid BC powders and other NSAIDs. -Use non-oral pain management strategies. -Do not start semaglutide  until your gastric symptoms are resolved. Notify our office when you are ready.  OBESITY: You have experienced significant weight gain and previously responded well to semaglutide  at lower doses. -We have prescribed semaglutide  (Wegovy ) and initiated insurance authorization. -Do not start semaglutide  until your gastritis is controlled. Notify our office when it is available. -We will conduct a physical exam and lab workup before starting semaglutide  if needed.  MENTAL HEALTH AND SLEEP DISTURBANCE: You have recently stopped most of your antidepressant and anxiety medications, leading to increased mental fatigue and sleep issues. -You are scheduled for a follow-up with your psychiatrist and will have blood work done to guide future medication selection.    Contains text generated by [employer].   "
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
## 2024-04-15 ENCOUNTER — Other Ambulatory Visit (HOSPITAL_COMMUNITY): Payer: Self-pay

## 2024-04-15 ENCOUNTER — Telehealth: Payer: Self-pay | Admitting: Pharmacy Technician

## 2024-04-15 NOTE — Telephone Encounter (Signed)
 Pharmacy Patient Advocate Encounter   Received notification from [facility] KEY that prior authorization for Nurtec 75MG  dispersible tablets is required/requested.   Insurance verification completed.   The patient is insured through [employer].   Per test claim: PA required; PA started via CoverMyMeds. KEY [id number] . Waiting for clinical questions to populate.

## 2024-04-16 ENCOUNTER — Other Ambulatory Visit (HOSPITAL_COMMUNITY): Payer: Self-pay

## 2024-04-17 ENCOUNTER — Other Ambulatory Visit (HOSPITAL_COMMUNITY): Payer: Self-pay
